# Patient Record
Sex: Female | Born: 1985 | Race: White | Hispanic: No | Marital: Single | Smoking: Current every day smoker
Health system: Southern US, Community
[De-identification: ages and names within clinical notes are randomized; demographics above are authoritative.]

## PROBLEM LIST (undated history)

## (undated) DIAGNOSIS — Z720 Tobacco use: Secondary | ICD-10-CM

## (undated) DIAGNOSIS — E161 Other hypoglycemia: Secondary | ICD-10-CM

## (undated) DIAGNOSIS — G43909 Migraine, unspecified, not intractable, without status migrainosus: Secondary | ICD-10-CM

## (undated) DIAGNOSIS — E039 Hypothyroidism, unspecified: Secondary | ICD-10-CM

## (undated) DIAGNOSIS — K219 Gastro-esophageal reflux disease without esophagitis: Secondary | ICD-10-CM

## (undated) DIAGNOSIS — L940 Localized scleroderma [morphea]: Secondary | ICD-10-CM

## (undated) DIAGNOSIS — I10 Essential (primary) hypertension: Secondary | ICD-10-CM

## (undated) DIAGNOSIS — R87629 Unspecified abnormal cytological findings in specimens from vagina: Secondary | ICD-10-CM

## (undated) HISTORY — DX: Unspecified abnormal cytological findings in specimens from vagina: R87.629

## (undated) HISTORY — PX: BELPHAROPTOSIS REPAIR: SHX369

## (undated) HISTORY — DX: Gastro-esophageal reflux disease without esophagitis: K21.9

## (undated) HISTORY — DX: Essential (primary) hypertension: I10

## (undated) HISTORY — PX: DILATION AND CURETTAGE OF UTERUS: SHX78

## (undated) HISTORY — DX: Tobacco use: Z72.0

## (undated) HISTORY — DX: Hypothyroidism, unspecified: E03.9

## (undated) HISTORY — DX: Localized scleroderma (morphea): L94.0

## (undated) HISTORY — PX: LEEP: SHX91

## (undated) HISTORY — DX: Migraine, unspecified, not intractable, without status migrainosus: G43.909

---

## 2005-01-22 ENCOUNTER — Observation Stay: Payer: Self-pay | Admitting: Obstetrics & Gynecology

## 2005-02-16 ENCOUNTER — Observation Stay: Payer: Self-pay

## 2005-03-18 ENCOUNTER — Observation Stay: Payer: Self-pay | Admitting: Obstetrics & Gynecology

## 2005-03-23 ENCOUNTER — Inpatient Hospital Stay: Payer: Self-pay | Admitting: Obstetrics & Gynecology

## 2006-03-13 ENCOUNTER — Observation Stay: Payer: Self-pay

## 2006-05-08 ENCOUNTER — Observation Stay: Payer: Self-pay | Admitting: Obstetrics and Gynecology

## 2006-06-10 ENCOUNTER — Inpatient Hospital Stay: Payer: Self-pay | Admitting: Obstetrics & Gynecology

## 2006-09-06 ENCOUNTER — Emergency Department: Payer: Self-pay | Admitting: Unknown Physician Specialty

## 2006-09-23 ENCOUNTER — Ambulatory Visit: Payer: Self-pay

## 2007-09-13 ENCOUNTER — Emergency Department: Payer: Self-pay | Admitting: Emergency Medicine

## 2008-09-18 ENCOUNTER — Emergency Department: Payer: Self-pay | Admitting: Emergency Medicine

## 2009-02-25 ENCOUNTER — Emergency Department: Payer: Self-pay | Admitting: Emergency Medicine

## 2010-06-18 ENCOUNTER — Emergency Department: Payer: Self-pay | Admitting: Emergency Medicine

## 2011-04-10 ENCOUNTER — Ambulatory Visit: Payer: Self-pay | Admitting: Nephrology

## 2012-04-01 ENCOUNTER — Emergency Department: Payer: Self-pay | Admitting: Emergency Medicine

## 2014-09-26 ENCOUNTER — Emergency Department: Payer: Self-pay | Admitting: Emergency Medicine

## 2015-02-07 DIAGNOSIS — G43909 Migraine, unspecified, not intractable, without status migrainosus: Secondary | ICD-10-CM | POA: Insufficient documentation

## 2015-02-07 DIAGNOSIS — I10 Essential (primary) hypertension: Secondary | ICD-10-CM | POA: Insufficient documentation

## 2015-02-07 DIAGNOSIS — Z720 Tobacco use: Secondary | ICD-10-CM | POA: Insufficient documentation

## 2015-02-07 DIAGNOSIS — K219 Gastro-esophageal reflux disease without esophagitis: Secondary | ICD-10-CM | POA: Insufficient documentation

## 2015-02-07 DIAGNOSIS — E039 Hypothyroidism, unspecified: Secondary | ICD-10-CM | POA: Insufficient documentation

## 2015-02-08 ENCOUNTER — Ambulatory Visit (INDEPENDENT_AMBULATORY_CARE_PROVIDER_SITE_OTHER): Payer: No Typology Code available for payment source | Admitting: Family Medicine

## 2015-02-08 ENCOUNTER — Encounter: Payer: Self-pay | Admitting: Family Medicine

## 2015-02-08 VITALS — BP 135/89 | HR 75 | Temp 98.3°F | Ht 65.5 in | Wt 175.0 lb

## 2015-02-08 DIAGNOSIS — E162 Hypoglycemia, unspecified: Secondary | ICD-10-CM

## 2015-02-08 DIAGNOSIS — Z720 Tobacco use: Secondary | ICD-10-CM

## 2015-02-08 DIAGNOSIS — R635 Abnormal weight gain: Secondary | ICD-10-CM | POA: Insufficient documentation

## 2015-02-08 DIAGNOSIS — E039 Hypothyroidism, unspecified: Secondary | ICD-10-CM

## 2015-02-08 DIAGNOSIS — Z0000 Encounter for general adult medical examination without abnormal findings: Secondary | ICD-10-CM

## 2015-02-08 DIAGNOSIS — E538 Deficiency of other specified B group vitamins: Secondary | ICD-10-CM

## 2015-02-08 NOTE — Patient Instructions (Addendum)
Do spread out your calories through the day Avoid really sweet foods and drinks, and try to have complex carbohydrates, nuts, whole grains instead of just sugary things Return for physical in the next few weeks and with provider of choice Avoid / limit caffeine and avoid diet drinks I still encourage smoking cessation  Smoking Cessation, Tips for Success If you are ready to quit smoking, congratulations! You have chosen to help yourself be healthier. Cigarettes bring nicotine, tar, carbon monoxide, and other irritants into your body. Your lungs, heart, and blood vessels will be able to work better without these poisons. There are many different ways to quit smoking. Nicotine gum, nicotine patches, a nicotine inhaler, or nicotine nasal spray can help with physical craving. Hypnosis, support groups, and medicines help break the habit of smoking. WHAT THINGS CAN I DO TO MAKE QUITTING EASIER?  Here are some tips to help you quit for good:  Pick a date when you will quit smoking completely. Tell all of your friends and family about your plan to quit on that date.  Do not try to slowly cut down on the number of cigarettes you are smoking. Pick a quit date and quit smoking completely starting on that day.  Throw away all cigarettes.   Clean and remove all ashtrays from your home, work, and car.  On a card, write down your reasons for quitting. Carry the card with you and read it when you get the urge to smoke.  Cleanse your body of nicotine. Drink enough water and fluids to keep your urine clear or pale yellow. Do this after quitting to flush the nicotine from your body.  Learn to predict your moods. Do not let a bad situation be your excuse to have a cigarette. Some situations in your life might tempt you into wanting a cigarette.  Never have "just one" cigarette. It leads to wanting another and another. Remind yourself of your decision to quit.  Change habits associated with smoking. If you  smoked while driving or when feeling stressed, try other activities to replace smoking. Stand up when drinking your coffee. Brush your teeth after eating. Sit in a different chair when you read the paper. Avoid alcohol while trying to quit, and try to drink fewer caffeinated beverages. Alcohol and caffeine may urge you to smoke.  Avoid foods and drinks that can trigger a desire to smoke, such as sugary or spicy foods and alcohol.  Ask people who smoke not to smoke around you.  Have something planned to do right after eating or having a cup of coffee. For example, plan to take a walk or exercise.  Try a relaxation exercise to calm you down and decrease your stress. Remember, you may be tense and nervous for the first 2 weeks after you quit, but this will pass.  Find new activities to keep your hands busy. Play with a pen, coin, or rubber band. Doodle or draw things on paper.  Brush your teeth right after eating. This will help cut down on the craving for the taste of tobacco after meals. You can also try mouthwash.   Use oral substitutes in place of cigarettes. Try using lemon drops, carrots, cinnamon sticks, or chewing gum. Keep them handy so they are available when you have the urge to smoke.  When you have the urge to smoke, try deep breathing.  Designate your home as a nonsmoking area.  If you are a heavy smoker, ask your health care provider about  a prescription for nicotine chewing gum. It can ease your withdrawal from nicotine.  Reward yourself. Set aside the cigarette money you save and buy yourself something nice.  Look for support from others. Join a support group or smoking cessation program. Ask someone at home or at work to help you with your plan to quit smoking.  Always ask yourself, "Do I need this cigarette or is this just a reflex?" Tell yourself, "Today, I choose not to smoke," or "I do not want to smoke." You are reminding yourself of your decision to quit.  Do not  replace cigarette smoking with electronic cigarettes (commonly called e-cigarettes). The safety of e-cigarettes is unknown, and some may contain harmful chemicals.  If you relapse, do not give up! Plan ahead and think about what you will do the next time you get the urge to smoke. HOW WILL I FEEL WHEN I QUIT SMOKING? You may have symptoms of withdrawal because your body is used to nicotine (the addictive substance in cigarettes). You may crave cigarettes, be irritable, feel very hungry, cough often, get headaches, or have difficulty concentrating. The withdrawal symptoms are only temporary. They are strongest when you first quit but will go away within 10-14 days. When withdrawal symptoms occur, stay in control. Think about your reasons for quitting. Remind yourself that these are signs that your body is healing and getting used to being without cigarettes. Remember that withdrawal symptoms are easier to treat than the major diseases that smoking can cause.  Even after the withdrawal is over, expect periodic urges to smoke. However, these cravings are generally short lived and will go away whether you smoke or not. Do not smoke! WHAT RESOURCES ARE AVAILABLE TO HELP ME QUIT SMOKING? Your health care provider can direct you to community resources or hospitals for support, which may include:  Group support.  Education.  Hypnosis.  Therapy. Document Released: 04/10/2004 Document Revised: 11/27/2013 Document Reviewed: 12/29/2012 [facility] Patient Information 2015 [place], [place]. This information is not intended to replace advice given to you by your health care provider. Make sure you discuss any questions you have with your health care provider.

## 2015-02-08 NOTE — Assessment & Plan Note (Signed)
Check thyroid function 

## 2015-02-08 NOTE — Progress Notes (Signed)
BP 135/89 mmHg  Pulse 75  Temp(Src) 98.3 F (36.8 C)  Ht 5' 5.5" (1.664 m)  Wt 175 lb (79.379 kg)  BMI 28.67 kg/m2  SpO2 95%  LMP 01/16/2015 (Approximate)   Subjective:    Patient ID: Janet Coleman, female    DOB: 1985-10-31, 29 y.o.   MRN: [id number]  HPI: Janet Coleman is a 29 y.o. female  Chief Complaint  Patient presents with  . Hypoglycemia    having episodes of jittery, weakness, fatigue. Has checked her sugar and it's been low.   She feels like she is falling to pieces It's all she can do to get through the day at work Going on for about a month, but worse the last two weeks Feels down, concentration and fatigue; hands and legs are shaky and feel like Jell-O A few years ago, had just a few episodes; it was hot outside Sugars are dropping to 60 range Spacing her foods out, grazing spreading the food out Checking sugars In the last year, gained 27 pounds Thyroid had been messed up and was on medicine and then they took her off medicine  Relevant past medical, surgical, family and social history reviewed and updated as indicated. Interim medical history since our last visit reviewed. Allergies and medications reviewed and updated. Grandmother never drank but was told she had cirrhosis; not sure about any lung disease  Review of Systems  Per HPI unless specifically indicated above     Objective:    BP 135/89 mmHg  Pulse 75  Temp(Src) 98.3 F (36.8 C)  Ht 5' 5.5" (1.664 m)  Wt 175 lb (79.379 kg)  BMI 28.67 kg/m2  SpO2 95%  LMP 01/16/2015 (Approximate)  Wt Readings from Last 3 Encounters:  02/08/15 175 lb (79.379 kg)  10/10/14 177 lb (80.287 kg)    Physical Exam  Constitutional: She appears well-developed and well-nourished. No distress.  HENT:  Head: Normocephalic and atraumatic.  Eyes: EOM are normal. No scleral icterus.  Neck: No thyromegaly present.  Cardiovascular: Normal rate, regular rhythm and normal heart sounds.   No murmur  heard. Pulmonary/Chest: Effort normal and breath sounds normal. No respiratory distress. She has no wheezes.  Abdominal: Soft. Bowel sounds are normal. She exhibits no distension.  Musculoskeletal: Normal range of motion. She exhibits no edema.  Neurological: She is alert. She displays no tremor. She exhibits normal muscle tone.  Reflex Scores:      Patellar reflexes are 2+ on the right side and 2+ on the left side. Skin: Skin is warm and dry. No rash noted. She is not diaphoretic. No pallor.  No palmar erythema  Psychiatric: She has a normal mood and affect. Her behavior is normal. Judgment and thought content normal.    No results found for this or any previous visit.    Assessment & Plan:   Problem List Items Addressed This Visit      Endocrine   Hypothyroidism    Check thyroid tests today; especially with recent weight gain      Relevant Orders   TSH   Hypoglycemia - Primary    Check GTT, insulin levels, A1C, glucose; do spread out calories, graze through the day, complex carbohydrates      Relevant Orders   Insulin, Free and Total   Hgb A1c w/o eAG   Glucose tolerance, 3 hours     Other   Tobacco use    Encouraged cessation; tips for success given in the after visit summary  Weight gain, abnormal    Check thyroid function      Relevant Orders   Hgb A1c w/o eAG    Other Visit Diagnoses    Preventative health care        Relevant Orders    Lipid Panel w/o Chol/HDL Ratio    Comprehensive metabolic panel    Vitamin B12 deficiency        check level; last lab was 305 (ideal is over 400) and that was on supplementation    Relevant Orders    Vitamin B12       An After Visit Summary was printed and given to the patient. Orders Placed This Encounter  Procedures  . Insulin, Free and Total  . TSH  . Hgb A1c w/o eAG  . Lipid Panel w/o Chol/HDL Ratio  . Vitamin B12  . Comprehensive metabolic panel  . Glucose tolerance, 3 hours   Follow up plan: Return  in about 2 weeks (around 02/22/2015) for (2-4 weeks) for complete physical with provider of choice.

## 2015-02-08 NOTE — Assessment & Plan Note (Signed)
Check GTT, insulin levels, A1C, glucose; do spread out calories, graze through the day, complex carbohydrates

## 2015-02-08 NOTE — Assessment & Plan Note (Signed)
Check thyroid tests today; especially with recent weight gain

## 2015-02-08 NOTE — Assessment & Plan Note (Signed)
Encouraged cessation; tips for success given in the after visit summary

## 2015-02-09 LAB — COMPREHENSIVE METABOLIC PANEL
ALK PHOS: 83 IU/L (ref 39–117)
ALT: 6 IU/L (ref 0–32)
AST: 11 IU/L (ref 0–40)
Albumin/Globulin Ratio: 1.9 (ref 1.1–2.5)
Albumin: 4.2 g/dL (ref 3.5–5.5)
BILIRUBIN TOTAL: 0.2 mg/dL (ref 0.0–1.2)
BUN / CREAT RATIO: 12 (ref 8–20)
BUN: 9 mg/dL (ref 6–20)
CHLORIDE: 100 mmol/L (ref 97–108)
CO2: 22 mmol/L (ref 18–29)
Calcium: 8.7 mg/dL (ref 8.7–10.2)
Creatinine, Ser: 0.73 mg/dL (ref 0.57–1.00)
GFR calc Af Amer: 129 mL/min/{1.73_m2} (ref >59)
GFR calc non Af Amer: 112 mL/min/{1.73_m2} (ref >59)
Globulin, Total: 2.2 g/dL (ref 1.5–4.5)
Glucose: 75 mg/dL (ref 65–99)
Potassium: 3.8 mmol/L (ref 3.5–5.2)
SODIUM: 138 mmol/L (ref 134–144)
TOTAL PROTEIN: 6.4 g/dL (ref 6.0–8.5)

## 2015-02-09 LAB — VITAMIN B12: Vitamin B-12: 266 pg/mL (ref 211–946)

## 2015-02-09 LAB — LIPID PANEL W/O CHOL/HDL RATIO
CHOLESTEROL TOTAL: 139 mg/dL (ref 100–199)
HDL: 48 mg/dL (ref >39)
LDL Calculated: 77 mg/dL (ref 0–99)
TRIGLYCERIDES: 69 mg/dL (ref 0–149)
VLDL Cholesterol Cal: 14 mg/dL (ref 5–40)

## 2015-02-09 LAB — HGB A1C W/O EAG: Hgb A1c MFr Bld: 5.2 % (ref 4.8–5.6)

## 2015-02-09 LAB — TSH: TSH: 3.47 u[IU]/mL (ref 0.450–4.500)

## 2015-02-14 LAB — INSULIN, FREE AND TOTAL
FREE INSULIN: 3.8 uU/mL
Total Insulin: 3.8 uU/mL

## 2015-02-15 ENCOUNTER — Encounter: Payer: Self-pay | Admitting: Family Medicine

## 2015-02-15 ENCOUNTER — Telehealth: Payer: Self-pay

## 2015-02-15 DIAGNOSIS — R531 Weakness: Secondary | ICD-10-CM

## 2015-02-15 NOTE — Telephone Encounter (Signed)
Received a voicemail from the patient stating that she still feels very bad, I believe that she said that she still had not received a few of her lab results (glucose tolerance?insulin free and total?) Please call and discuss this with the patient.

## 2015-02-15 NOTE — Telephone Encounter (Signed)
Legs are really weak, very fatigued, hands get shaky, extremely wore out STAFF -- patient coming in first thing Monday morning; let's get GTT scheduled and draw cortisol and CBC and magnesium Appt after that with me or Elnita Maxwell, whichever To Urgent Care over weekend over weekend

## 2015-02-18 ENCOUNTER — Other Ambulatory Visit: Payer: No Typology Code available for payment source

## 2015-02-18 NOTE — Telephone Encounter (Signed)
Dr. Sherie Don I received a message regarding this patient and I am not sure what am I supposed to do with it? Message was sent Friday after 5 pm and I just got here after lunch?

## 2015-02-18 NOTE — Telephone Encounter (Signed)
Patient should have been seen in the morning; she did not come in, I suppose Orders were entered into the chart last week She still needs the GTT scheduled I will close the chart since she did not come in today She most likely simply has reactive hypoglycemia but I have entered other labs for completeness

## 2015-02-19 ENCOUNTER — Other Ambulatory Visit: Payer: No Typology Code available for payment source

## 2015-02-19 DIAGNOSIS — E162 Hypoglycemia, unspecified: Secondary | ICD-10-CM

## 2015-02-19 DIAGNOSIS — R531 Weakness: Secondary | ICD-10-CM

## 2015-02-19 LAB — GLUCOSE HEMOCUE WAIVED: GLU HEMOCUE WAIVED: 83 mg/dL (ref 65–99)

## 2015-02-20 ENCOUNTER — Telehealth: Payer: Self-pay | Admitting: Family Medicine

## 2015-02-20 ENCOUNTER — Other Ambulatory Visit
Admission: RE | Admit: 2015-02-20 | Discharge: 2015-02-20 | Disposition: A | Discharge status: Home / Self Care | Payer: No Typology Code available for payment source | Source: Ambulatory Visit | Attending: Unknown Physician Specialty | Admitting: Unknown Physician Specialty

## 2015-02-20 DIAGNOSIS — E161 Other hypoglycemia: Secondary | ICD-10-CM

## 2015-02-20 DIAGNOSIS — E162 Hypoglycemia, unspecified: Secondary | ICD-10-CM | POA: Insufficient documentation

## 2015-02-20 DIAGNOSIS — E270 Other adrenocortical overactivity: Secondary | ICD-10-CM

## 2015-02-20 DIAGNOSIS — R7989 Other specified abnormal findings of blood chemistry: Secondary | ICD-10-CM

## 2015-02-20 LAB — CORTISOL: CORTISOL: 19.9 ug/dL

## 2015-02-20 LAB — CBC WITH DIFFERENTIAL/PLATELET
Basophils Absolute: 0 10*3/uL (ref 0.0–0.2)
Basos: 0 %
EOS (ABSOLUTE): 0.2 10*3/uL (ref 0.0–0.4)
Eos: 3 %
Hematocrit: 41.3 % (ref 34.0–46.6)
Hemoglobin: 14 g/dL (ref 11.1–15.9)
IMMATURE GRANS (ABS): 0 10*3/uL (ref 0.0–0.1)
IMMATURE GRANULOCYTES: 0 %
Lymphocytes Absolute: 2.1 10*3/uL (ref 0.7–3.1)
Lymphs: 33 %
MCH: 31.3 pg (ref 26.6–33.0)
MCHC: 33.9 g/dL (ref 31.5–35.7)
MCV: 92 fL (ref 79–97)
MONOCYTES: 6 %
Monocytes Absolute: 0.4 10*3/uL (ref 0.1–0.9)
Neutrophils Absolute: 3.6 10*3/uL (ref 1.4–7.0)
Neutrophils: 58 %
Platelets: 205 10*3/uL (ref 150–379)
RBC: 4.48 x10E6/uL (ref 3.77–5.28)
RDW: 13.1 % (ref 12.3–15.4)
WBC: 6.3 10*3/uL (ref 3.4–10.8)

## 2015-02-20 LAB — GLUCOSE, 1 HOUR GESTATIONAL: GLUCOSE, 1 HOUR-GESTATIONAL: 94 mg/dL (ref 70–189)

## 2015-02-20 LAB — GLUCOSE, FASTING GESTATIONAL: Glucose Tolerance, Fasting: 96 mg/dL

## 2015-02-20 LAB — GLUCOSE, 2 HOUR GESTATIONAL: GLUCOSE, 2 HOUR-GESTATIONAL: 57 mg/dL — AB (ref 70–164)

## 2015-02-20 LAB — MAGNESIUM: MAGNESIUM: 2.1 mg/dL (ref 1.6–2.3)

## 2015-02-20 LAB — GLUCOSE, 3 HOUR GESTATIONAL: Glucose, GTT - 3 Hour: 78 mg/dL (ref 70–144)

## 2015-02-20 LAB — SPECIMEN STATUS REPORT

## 2015-02-20 NOTE — Telephone Encounter (Signed)
I called, but voicemail has not been set up yet

## 2015-02-21 ENCOUNTER — Telehealth: Payer: Self-pay | Admitting: Family Medicine

## 2015-02-21 DIAGNOSIS — E161 Other hypoglycemia: Secondary | ICD-10-CM | POA: Insufficient documentation

## 2015-02-21 DIAGNOSIS — R7989 Other specified abnormal findings of blood chemistry: Secondary | ICD-10-CM | POA: Insufficient documentation

## 2015-02-21 DIAGNOSIS — E270 Other adrenocortical overactivity: Secondary | ICD-10-CM

## 2015-02-21 NOTE — Telephone Encounter (Signed)
I talked with patient Reactive hypoglycemia Avoid sugars, white bread, sweetened drinks, etc. Cortisol is mildly elevated Refer to endo (not sure if medicine indicated like starlix, since her symptoms are severe and came on so suddenly and the abnormal cortisol to boot)

## 2015-02-21 NOTE — Telephone Encounter (Signed)
There is another phone already open; I tried to reach her yesterday; see the other phone note

## 2015-02-21 NOTE — Telephone Encounter (Signed)
Routing to provider  

## 2015-02-21 NOTE — Telephone Encounter (Signed)
Pt called inquiring about her labs from past couple of days, one done here and yesterday done at [facility].

## 2015-03-01 ENCOUNTER — Encounter: Payer: Self-pay | Admitting: Unknown Physician Specialty

## 2015-03-01 ENCOUNTER — Ambulatory Visit (INDEPENDENT_AMBULATORY_CARE_PROVIDER_SITE_OTHER): Payer: No Typology Code available for payment source | Admitting: Unknown Physician Specialty

## 2015-03-01 VITALS — BP 127/84 | HR 70 | Temp 98.5°F | Ht 65.75 in | Wt 172.0 lb

## 2015-03-01 DIAGNOSIS — E162 Hypoglycemia, unspecified: Secondary | ICD-10-CM | POA: Diagnosis not present

## 2015-03-01 DIAGNOSIS — Z0000 Encounter for general adult medical examination without abnormal findings: Secondary | ICD-10-CM | POA: Diagnosis not present

## 2015-03-01 DIAGNOSIS — R87619 Unspecified abnormal cytological findings in specimens from cervix uteri: Secondary | ICD-10-CM

## 2015-03-01 NOTE — Assessment & Plan Note (Signed)
Discussed low GI diet.  Recommended resources.  Consider nutritionist.

## 2015-03-01 NOTE — Progress Notes (Signed)
   BP 127/84 mmHg  Pulse 70  Temp(Src) 98.5 F (36.9 C)  Ht 5' 5.75" (1.67 m)  Wt 172 lb (78.019 kg)  BMI 27.97 kg/m2  SpO2 99%  LMP 02/14/2015 (Exact Date)   Subjective:    Patient ID: Janet Coleman, female    DOB: 05/04/1986, 29 y.o.   MRN: [id number]  HPI: Janet Coleman is a 28 y.o. female  Chief Complaint  Patient presents with  . Annual Exam     Relevant past medical, surgical, family and social history reviewed and updated as indicated. Interim medical history since our last visit reviewed. Allergies and medications reviewed and updated.  Discussed Hypoglycemia.    Review of Systems  Constitutional: Negative.   HENT: Negative.   Eyes: Negative.   Respiratory: Negative.   Cardiovascular: Negative.   Gastrointestinal: Negative.   Endocrine: Negative.   Genitourinary: Negative.   Musculoskeletal: Negative.   Skin: Negative.   Allergic/Immunologic: Negative.   Neurological: Negative.   Hematological: Negative.   Psychiatric/Behavioral: Negative.     Per HPI unless specifically indicated above     Objective:    BP 127/84 mmHg  Pulse 70  Temp(Src) 98.5 F (36.9 C)  Ht 5' 5.75" (1.67 m)  Wt 172 lb (78.019 kg)  BMI 27.97 kg/m2  SpO2 99%  LMP 02/14/2015 (Exact Date)  Wt Readings from Last 3 Encounters:  03/01/15 172 lb (78.019 kg)  02/08/15 175 lb (79.379 kg)  10/10/14 177 lb (80.287 kg)    Physical Exam  Constitutional: She is oriented to person, place, and time. She appears well-developed and well-nourished.  HENT:  Head: Normocephalic and atraumatic.  Eyes: Pupils are equal, round, and reactive to light. Right eye exhibits no discharge. Left eye exhibits no discharge. No scleral icterus.  Neck: Normal range of motion. Neck supple. Carotid bruit is not present. No thyromegaly present.  Cardiovascular: Normal rate, regular rhythm and normal heart sounds.  Exam reveals no gallop and no friction rub.   No murmur heard. Pulmonary/Chest: Effort  normal and breath sounds normal. No respiratory distress. She has no wheezes. She has no rales.  Abdominal: Soft. Bowel sounds are normal. There is no tenderness. There is no rebound.  Genitourinary: Vagina normal and uterus normal. No breast swelling, tenderness or discharge. Cervix exhibits no motion tenderness, no discharge and no friability. Right adnexum displays no mass, no tenderness and no fullness. Left adnexum displays no mass, no tenderness and no fullness.  Musculoskeletal: Normal range of motion.  Lymphadenopathy:    She has no cervical adenopathy.  Neurological: She is alert and oriented to person, place, and time.  Skin: Skin is warm, dry and intact. No rash noted.  Psychiatric: She has a normal mood and affect. Her speech is normal and behavior is normal. Judgment and thought content normal. Cognition and memory are normal.  Vitals reviewed.    Assessment & Plan:   Problem List Items Addressed This Visit      Unprioritized   Hypoglycemia    Discussed low GI diet.  Recommended resources.  Consider nutritionist.         Other Visit Diagnoses    Annual physical exam    -  Primary        Follow up plan: Return if symptoms worsen or fail to improve.

## 2015-03-01 NOTE — Addendum Note (Signed)
Addended by: Gabriel Cirri on: 03/01/2015 03:59 PM   Modules accepted: Kipp Brood

## 2015-03-01 NOTE — Patient Instructions (Addendum)
Glycemic Index.   "Eat Fat Get Thin"   "Sugar Busters" Consider Gluten Free diet.

## 2015-03-01 NOTE — Addendum Note (Signed)
Addended by: Gabriel Cirri on: 03/01/2015 03:58 PM   Modules accepted: Orders, SmartSet

## 2015-03-06 ENCOUNTER — Encounter: Payer: Self-pay | Admitting: Unknown Physician Specialty

## 2015-03-06 LAB — PAP LB, RFX HPV ASCU: PAP Smear Comment: 0

## 2015-03-07 ENCOUNTER — Telehealth: Payer: Self-pay

## 2015-03-07 DIAGNOSIS — IMO0002 Reserved for concepts with insufficient information to code with codable children: Secondary | ICD-10-CM

## 2015-03-07 NOTE — Telephone Encounter (Signed)
I called her Tuesday and sent a letter because I couldn't get hold of her.  Please let her know she had an abnormal pap but it was not cancer.  But, to keep it from becoming cancer, it needs to be treated.  This can be done by an office procedure.  I would like to refer her to [facility] unless she would like to go to another gynecologist.  But, i will put the order in for [facility].

## 2015-03-07 NOTE — Telephone Encounter (Signed)
Patient called stating that someone called her on Friday 03/01/15 but I do not see any documentation of a call. Patient states voicemail is not set up but she just knows she had a missed call. Patient thinks it may be about her PAP. What can I tell her regarding her PAP?

## 2015-03-08 NOTE — Telephone Encounter (Signed)
Called and let patient know

## 2015-04-03 DIAGNOSIS — Z720 Tobacco use: Secondary | ICD-10-CM | POA: Diagnosis not present

## 2015-04-03 DIAGNOSIS — N8320 Unspecified ovarian cysts: Secondary | ICD-10-CM | POA: Diagnosis not present

## 2015-04-03 DIAGNOSIS — N200 Calculus of kidney: Secondary | ICD-10-CM | POA: Insufficient documentation

## 2015-04-03 DIAGNOSIS — I10 Essential (primary) hypertension: Secondary | ICD-10-CM | POA: Insufficient documentation

## 2015-04-03 DIAGNOSIS — R1031 Right lower quadrant pain: Secondary | ICD-10-CM | POA: Diagnosis present

## 2015-04-04 ENCOUNTER — Emergency Department
Admission: EM | Admit: 2015-04-04 | Discharge: 2015-04-04 | Disposition: A | Discharge status: Home / Self Care | Payer: No Typology Code available for payment source | Attending: Emergency Medicine | Admitting: Emergency Medicine

## 2015-04-04 ENCOUNTER — Encounter: Payer: Self-pay | Admitting: Emergency Medicine

## 2015-04-04 ENCOUNTER — Emergency Department: Payer: No Typology Code available for payment source

## 2015-04-04 DIAGNOSIS — N83201 Unspecified ovarian cyst, right side: Secondary | ICD-10-CM

## 2015-04-04 DIAGNOSIS — N200 Calculus of kidney: Secondary | ICD-10-CM

## 2015-04-04 HISTORY — DX: Other hypoglycemia: E16.1

## 2015-04-04 LAB — CBC WITH DIFFERENTIAL/PLATELET
BASOS ABS: 0.1 10*3/uL (ref 0–0.1)
Basophils Relative: 1 %
Eosinophils Absolute: 0.2 10*3/uL (ref 0–0.7)
Eosinophils Relative: 2 %
HEMATOCRIT: 42.2 % (ref 35.0–47.0)
Hemoglobin: 14.3 g/dL (ref 12.0–16.0)
LYMPHS PCT: 18 %
Lymphs Abs: 2.1 10*3/uL (ref 1.0–3.6)
MCH: 31.2 pg (ref 26.0–34.0)
MCHC: 34 g/dL (ref 32.0–36.0)
MCV: 91.9 fL (ref 80.0–100.0)
Monocytes Absolute: 0.4 10*3/uL (ref 0.2–0.9)
Monocytes Relative: 4 %
NEUTROS ABS: 8.7 10*3/uL — AB (ref 1.4–6.5)
Neutrophils Relative %: 75 %
PLATELETS: 180 10*3/uL (ref 150–440)
RBC: 4.59 MIL/uL (ref 3.80–5.20)
RDW: 12.8 % (ref 11.5–14.5)
WBC: 11.5 10*3/uL — AB (ref 3.6–11.0)

## 2015-04-04 LAB — COMPREHENSIVE METABOLIC PANEL
ALT: 13 U/L — ABNORMAL LOW (ref 14–54)
AST: 17 U/L (ref 15–41)
Albumin: 4.4 g/dL (ref 3.5–5.0)
Alkaline Phosphatase: 80 U/L (ref 38–126)
Anion gap: 10 (ref 5–15)
BILIRUBIN TOTAL: 0.5 mg/dL (ref 0.3–1.2)
BUN: 12 mg/dL (ref 6–20)
CHLORIDE: 104 mmol/L (ref 101–111)
CO2: 24 mmol/L (ref 22–32)
Calcium: 9.3 mg/dL (ref 8.9–10.3)
Creatinine, Ser: 0.8 mg/dL (ref 0.44–1.00)
GFR calc Af Amer: >60 mL/min (ref >60)
GFR calc non Af Amer: >60 mL/min (ref >60)
Glucose, Bld: 117 mg/dL — ABNORMAL HIGH (ref 65–99)
POTASSIUM: 4 mmol/L (ref 3.5–5.1)
Sodium: 138 mmol/L (ref 135–145)
Total Protein: 7.3 g/dL (ref 6.5–8.1)

## 2015-04-04 LAB — URINALYSIS COMPLETE WITH MICROSCOPIC (ARMC ONLY)
Bilirubin Urine: NEGATIVE
Glucose, UA: NEGATIVE mg/dL
Hgb urine dipstick: NEGATIVE
Leukocytes, UA: NEGATIVE
Nitrite: NEGATIVE
PH: 8 (ref 5.0–8.0)
PROTEIN: NEGATIVE mg/dL
Specific Gravity, Urine: 1.02 (ref 1.005–1.030)

## 2015-04-04 MED ORDER — MORPHINE SULFATE (PF) 2 MG/ML IV SOLN
2.0000 mg | Freq: Once | INTRAVENOUS | Status: AC
  Administered 2015-04-04: 2 mg via INTRAVENOUS

## 2015-04-04 MED ORDER — IOHEXOL 240 MG/ML SOLN
25.0000 mL | Freq: Once | INTRAMUSCULAR | Status: AC | PRN
  Administered 2015-04-04: 25 mL via ORAL

## 2015-04-04 MED ORDER — ONDANSETRON HCL 4 MG/2ML IJ SOLN
INTRAMUSCULAR | Status: AC
  Administered 2015-04-04: 4 mg via INTRAVENOUS
  Filled 2015-04-04: qty 2

## 2015-04-04 MED ORDER — IOHEXOL 300 MG/ML  SOLN
100.0000 mL | Freq: Once | INTRAMUSCULAR | Status: AC | PRN
  Administered 2015-04-04: 100 mL via INTRAVENOUS

## 2015-04-04 MED ORDER — ONDANSETRON HCL 4 MG/2ML IJ SOLN
4.0000 mg | Freq: Once | INTRAMUSCULAR | Status: AC
  Administered 2015-04-04: 4 mg via INTRAVENOUS

## 2015-04-04 MED ORDER — MORPHINE SULFATE (PF) 2 MG/ML IV SOLN
INTRAVENOUS | Status: AC
  Administered 2015-04-04: 2 mg via INTRAVENOUS
  Filled 2015-04-04: qty 1

## 2015-04-04 MED ORDER — OXYCODONE-ACETAMINOPHEN 5-325 MG PO TABS: 1.0000 | ORAL_TABLET | ORAL | Status: DC | PRN

## 2015-04-04 NOTE — ED Provider Notes (Signed)
[facility] Emergency Department Provider Note  ____________________________________________  Time seen: 2:00 AM  I have reviewed the triage vital signs and the nursing notes.   HISTORY  Chief Complaint Abdominal Pain and Nausea     HPI Janet Coleman is a 29 y.o. female presents with right lower quadrant abdominal pain with onset approximately 9:00 PM patient also admits to nausea but no vomiting. Patient denies any fever no dysuria no hematuria no constipation or diarrhea. Current pain score 8 out of 10     Past Medical History  Diagnosis Date  . GERD (gastroesophageal reflux disease)   . Hypertension   . 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  . Hypothyroidism   . Tobacco use   . Migraines   . Reactive hypoglycemia     Patient Active Problem List   Diagnosis Date Noted  . Reactive hypoglycemia 02/21/2015  . Elevated cortisol level 02/21/2015  . Weight gain, abnormal 02/08/2015  . Hypoglycemia 02/08/2015  . GERD (gastroesophageal reflux disease)   . Hypertension   . Hypothyroidism   . Tobacco use   . Migraines     Past Surgical History  Procedure Laterality Date  . Dilation and curettage of uterus      x 2  . Leep      x 2. last paps 2011, 2012, 2013 were normal    Current Outpatient Rx  Name  Route  Sig  Dispense  Refill  . SUMAtriptan (IMITREX) 50 MG tablet   Oral   Take 50 mg by mouth every 2 (two) hours as needed for migraine. May repeat in 2 hours if headache persists or recurs.           Allergies Toradol  Family History  Problem Relation Age of Onset  . Hypertension Mother   . Autoimmune disease Mother   . Stroke Father   . Hypertension Father   . Heart attack Father   . Aneurysm Father     brain  . Heart disease Father   . Cancer Maternal Aunt     breast  . Cancer Maternal Grandmother     breast  . Diabetes Maternal Grandmother   . Stroke Maternal  Grandfather   . Diabetes Paternal Grandmother   . Heart disease Paternal Grandmother     Social History Social History  Substance Use Topics  . Smoking status: Current Every Day Smoker -- 0.50 packs/day    Types: Cigarettes  . Smokeless tobacco: Never Used  . Alcohol Use: No    Review of Systems  Constitutional: Negative for fever. Eyes: Negative for visual changes. ENT: Negative for sore throat. Cardiovascular: Negative for chest pain. Respiratory: Negative for shortness of breath. Gastrointestinal: Positive for abdominal pain and nausea, negative for vomiting and diarrhea. Genitourinary: Negative for dysuria. Musculoskeletal: Negative for back pain. Skin: Negative for rash. Neurological: Negative for headaches, focal weakness or numbness.   10-point ROS otherwise negative.  ____________________________________________   PHYSICAL EXAM:  VITAL SIGNS: ED Triage Vitals  Enc Vitals Group     BP 04/04/15 0000 139/75 mmHg     Pulse Rate 04/04/15 0000 88     Resp 04/04/15 0000 18     Temp 04/04/15 0000 97.8 F (36.6 C)     Temp Source 04/04/15 0000 Oral     SpO2 04/04/15 0000 96 %     Weight 04/04/15 0000 172 lb (78.019 kg)     Height 04/04/15 0000  5\' 6"  (1.676 m)     Head Cir --      Peak Flow --      Pain Score 04/04/15 0001 9     Pain Loc --      Pain Edu? --      Excl. in GC? --      Constitutional: Alert and oriented. Well appearing and in no distress. Eyes: Conjunctivae are normal. PERRL. Normal extraocular movements. ENT   Head: Normocephalic and atraumatic.   Nose: No congestion/rhinnorhea.   Mouth/Throat: Mucous membranes are moist.   Neck: No stridor. Cardiovascular: Normal rate, regular rhythm. Normal and symmetric distal pulses are present in all extremities. No murmurs, rubs, or gallops. Respiratory: Normal respiratory effort without tachypnea nor retractions. Breath sounds are clear and equal bilaterally. No  wheezes/rales/rhonchi. Gastrointestinal: Positive right lower quadrant pain No distention. There is no CVA tenderness. Genitourinary: deferred Musculoskeletal: Nontender with normal range of motion in all extremities. No joint effusions.  No lower extremity tenderness nor edema. Neurologic:  Normal speech and language. No gross focal neurologic deficits are appreciated. Speech is normal.  Skin:  Skin is warm, dry and intact. No rash noted. Psychiatric: Mood and affect are normal. Speech and behavior are normal. Patient exhibits appropriate insight and judgment.  ____________________________________________    LABS (pertinent positives/negatives)  Labs Reviewed  CBC WITH DIFFERENTIAL/PLATELET - Abnormal; Notable for the following:    WBC 11.5 (*)    Neutro Abs 8.7 (*)    All other components within normal limits  COMPREHENSIVE METABOLIC PANEL - Abnormal; Notable for the following:    Glucose, Bld 117 (*)    ALT 13 (*)    All other components within normal limits  URINALYSIS COMPLETEWITH MICROSCOPIC ([facility] ONLY) - Abnormal; Notable for the following:    Color, Urine YELLOW (*)    APPearance CLEAR (*)    Ketones, ur 1+ (*)    Bacteria, UA RARE (*)    Squamous Epithelial / LPF 6-30 (*)    All other components within normal limits      RADIOLOGY        CT Abdomen Pelvis W Contrast (Final result) Result time: 04/04/15 02:52:15   Final result by Rad Results In Interface (04/04/15 02:52:15)   Narrative:   CLINICAL DATA: RIGHT lower quadrant pain beginning around 2100 hours. History of exploratory laparotomy.  EXAM: CT ABDOMEN AND PELVIS WITH CONTRAST  TECHNIQUE: Multidetector CT imaging of the abdomen and pelvis was performed using the standard protocol following bolus administration of intravenous contrast.  CONTRAST: OMNIPAQUE IOHEXOL 300 MG/ML SOLN  COMPARISON: CT abdomen and pelvis June 18, 2010  FINDINGS: LUNG BASES: Included view of the lung  bases demonstrate dependent atelectasis. Visualized heart and pericardium are unremarkable.  SOLID ORGANS: The liver, spleen, gallbladder, pancreas and adrenal glands are unremarkable.  GASTROINTESTINAL TRACT: The stomach, small and large bowel are normal in course and caliber without inflammatory changes. Normal appendix. Moderate amount of retained large bowel stool.  KIDNEYS/ URINARY TRACT: Kidneys are orthotopic, demonstrating symmetric enhancement. Mild bilateral hydroureteronephrosis ; 2 mm LEFT ureterovesicular junction calculus. No nephrolithiasis, or solid renal masses. The unopacified ureters are normal in course and caliber. Urinary bladder is well distended.  PERITONEUM/RETROPERITONEUM: Aortoiliac vessels are normal in course and caliber. No lymphadenopathy by CT size criteria. Internal reproductive organs are unremarkable. Small amount of free fluid in the pelvis and about the RIGHT adnexa with peripherally enhancing 2.1 cm dominant follicle/cyst.  SOFT TISSUE/OSSEOUS STRUCTURES: Non-suspicious. Small fat containing  umbilical hernia.  IMPRESSION: Mild bilateral hydroureteronephrosis. 2 mm LEFT ureterovesicular junction calculus.  2.1 cm dominant RIGHT ovarian follicle/cyst with small amount of free fluid in the pelvis, and about the RIGHT ovary which could represent ruptured cyst.  Normal appendix.   Electronically Signed By: Awilda Metro M.D. On: 04/04/2015 02:52          INITIAL IMPRESSION / ASSESSMENT AND PLAN / ED COURSE  Pertinent labs & imaging results that were available during my care of the patient were reviewed by me and considered in my medical decision making (see chart for details).  Patient received IV morphine for pain control with significant pain improvement. Patient CT scan revealed a left 2 mm ureterovesicular junction kidney stone as well as a right ovarian cyst without evidence of rupture. Patient was informed of all  clinical findings  ____________________________________________   FINAL CLINICAL IMPRESSION(S) / ED DIAGNOSES  Final diagnoses:  Kidney stone on left side  Cyst of right ovary      Darci Current, MD 04/04/15 [id number]

## 2015-04-04 NOTE — ED Notes (Signed)
Pt presents to ED with right lower abd pain. Onset around 2100. Pt reports nausea but denies vomiting. Denies similar symptoms previously. Pt states she went to sleep for about an hour but her pain increased and woke her up. Pt appears uncomfortable and slightly restless during triage and states she does not want to sit down while she answers questions. Pt alert with no increased work of breathing or acute distress noted at this time. States pain seems to improve slightly with light pressure.

## 2015-04-04 NOTE — Discharge Instructions (Signed)
Kidney Stones °Kidney stones (urolithiasis) are deposits that form inside your kidneys. The intense pain is caused by the stone moving through the urinary tract. When the stone moves, the ureter goes into spasm around the stone. The stone is usually passed in the urine.  °CAUSES  °· A disorder that makes certain neck glands produce too much parathyroid hormone (primary hyperparathyroidism). °· A buildup of uric acid crystals, similar to gout in your joints. °· Narrowing (stricture) of the ureter. °· A kidney obstruction present at birth (congenital obstruction). °· Previous surgery on the kidney or ureters. °· Numerous kidney infections. °SYMPTOMS  °· Feeling sick to your stomach (nauseous). °· Throwing up (vomiting). °· Blood in the urine (hematuria). °· Pain that usually spreads (radiates) to the groin. °· Frequency or urgency of urination. °DIAGNOSIS  °· Taking a history and physical exam. °· Blood or urine tests. °· CT scan. °· Occasionally, an examination of the inside of the urinary bladder (cystoscopy) is performed. °TREATMENT  °· Observation. °· Increasing your fluid intake. °· Extracorporeal shock wave lithotripsy--This is a noninvasive procedure that uses shock waves to break up kidney stones. °· Surgery may be needed if you have severe pain or persistent obstruction. There are various surgical procedures. Most of the procedures are performed with the use of small instruments. Only small incisions are needed to accommodate these instruments, so recovery time is minimized. °The size, location, and chemical composition are all important variables that will determine the proper choice of action for you. Talk to your health care provider to better understand your situation so that you will minimize the risk of injury to yourself and your kidney.  °HOME CARE INSTRUCTIONS  °· Drink enough water and fluids to keep your urine clear or pale yellow. This will help you to pass the stone or stone fragments. °· Strain  all urine through the provided strainer. Keep all particulate matter and stones for your health care provider to see. The stone causing the pain may be as small as a grain of salt. It is very important to use the strainer each and every time you pass your urine. The collection of your stone will allow your health care provider to analyze it and verify that a stone has actually passed. The stone analysis will often identify what you can do to reduce the incidence of recurrences. °· Only take over-the-counter or prescription medicines for pain, discomfort, or fever as directed by your health care provider. °· Make a follow-up appointment with your health care provider as directed. °· Get follow-up X-rays if required. The absence of pain does not always mean that the stone has passed. It may have only stopped moving. If the urine remains completely obstructed, it can cause loss of kidney function or even complete destruction of the kidney. It is your responsibility to make sure X-rays and follow-ups are completed. Ultrasounds of the kidney can show blockages and the status of the kidney. Ultrasounds are not associated with any radiation and can be performed easily in a matter of minutes. °SEEK MEDICAL CARE IF: °· You experience pain that is progressive and unresponsive to any pain medicine you have been prescribed. °SEEK IMMEDIATE MEDICAL CARE IF:  °· Pain cannot be controlled with the prescribed medicine. °· You have a fever or shaking chills. °· The severity or intensity of pain increases over 18 hours and is not relieved by pain medicine. °· You develop a new onset of abdominal pain. °· You feel faint or pass out. °·   You are unable to urinate. MAKE SURE YOU:   Understand these instructions.  Will watch your condition.  Will get help right away if you are not doing well or get worse. Document Released: 07/13/2005 Document Revised: 03/15/2013 Document Reviewed: 12/14/2012 [facility] Patient Information 2015  [place], [place]. This information is not intended to replace advice given to you by your health care provider. Make sure you discuss any questions you have with your health care provider.  Ovarian Cyst An ovarian cyst is a fluid-filled sac that forms on an ovary. The ovaries are small organs that produce eggs in women. Various types of cysts can form on the ovaries. Most are not cancerous. Many do not cause problems, and they often go away on their own. Some may cause symptoms and require treatment. Common types of ovarian cysts include:  Functional cysts--These cysts may occur every month during the menstrual cycle. This is normal. The cysts usually go away with the next menstrual cycle if the woman does not get pregnant. Usually, there are no symptoms with a functional cyst.  Endometrioma cysts--These cysts form from the tissue that lines the uterus. They are also called "chocolate cysts" because they become filled with blood that turns Janet Coleman. This type of cyst can cause pain in the lower abdomen during intercourse and with your menstrual period.  Cystadenoma cysts--This type develops from the cells on the outside of the ovary. These cysts can get very big and cause lower abdomen pain and pain with intercourse. This type of cyst can twist on itself, cut off its blood supply, and cause severe pain. It can also easily rupture and cause a lot of pain.  Dermoid cysts--This type of cyst is sometimes found in both ovaries. These cysts may contain different kinds of body tissue, such as skin, teeth, hair, or cartilage. They usually do not cause symptoms unless they get very big.  Theca lutein cysts--These cysts occur when too much of a certain hormone (human chorionic gonadotropin) is produced and overstimulates the ovaries to produce an egg. This is most common after procedures used to assist with the conception of a baby (in vitro fertilization). CAUSES   Fertility drugs can cause a condition in which  multiple large cysts are formed on the ovaries. This is called ovarian hyperstimulation syndrome.  A condition called polycystic ovary syndrome can cause hormonal imbalances that can lead to nonfunctional ovarian cysts. SIGNS AND SYMPTOMS  Many ovarian cysts do not cause symptoms. If symptoms are present, they may include:  Pelvic pain or pressure.  Pain in the lower abdomen.  Pain during sexual intercourse.  Increasing girth (swelling) of the abdomen.  Abnormal menstrual periods.  Increasing pain with menstrual periods.  Stopping having menstrual periods without being pregnant. DIAGNOSIS  These cysts are commonly found during a routine or annual pelvic exam. Tests may be ordered to find out more about the cyst. These tests may include:  Ultrasound.  X-ray of the pelvis.  CT scan.  MRI.  Blood tests. TREATMENT  Many ovarian cysts go away on their own without treatment. Your health care provider may want to check your cyst regularly for 2-3 months to see if it changes. For women in menopause, it is particularly important to monitor a cyst closely because of the higher rate of ovarian cancer in menopausal women. When treatment is needed, it may include any of the following:  A procedure to drain the cyst (aspiration). This may be done using a long needle and ultrasound. It  can also be done through a laparoscopic procedure. This involves using a thin, lighted tube with a tiny camera on the end (laparoscope) inserted through a small incision.  Surgery to remove the whole cyst. This may be done using laparoscopic surgery or an open surgery involving a larger incision in the lower abdomen.  Hormone treatment or birth control pills. These methods are sometimes used to help dissolve a cyst. HOME CARE INSTRUCTIONS   Only take over-the-counter or prescription medicines as directed by your health care provider.  Follow up with your health care provider as directed.  Get regular  pelvic exams and Pap tests. SEEK MEDICAL CARE IF:   Your periods are late, irregular, or painful, or they stop.  Your pelvic pain or abdominal pain does not go away.  Your abdomen becomes larger or swollen.  You have pressure on your bladder or trouble emptying your bladder completely.  You have pain during sexual intercourse.  You have feelings of fullness, pressure, or discomfort in your stomach.  You lose weight for no apparent reason.  You feel generally ill.  You become constipated.  You lose your appetite.  You develop acne.  You have an increase in body and facial hair.  You are gaining weight, without changing your exercise and eating habits.  You think you are pregnant. SEEK IMMEDIATE MEDICAL CARE IF:   You have increasing abdominal pain.  You feel sick to your stomach (nauseous), and you throw up (vomit).  You develop a fever that comes on suddenly.  You have abdominal pain during a bowel movement.  Your menstrual periods become heavier than usual. MAKE SURE YOU:  Understand these instructions.  Will watch your condition.  Will get help right away if you are not doing well or get worse. Document Released: 07/13/2005 Document Revised: 07/18/2013 Document Reviewed: 03/20/2013 [facility] Patient Information 2015 [place], [place]. This information is not intended to replace advice given to you by your health care provider. Make sure you discuss any questions you have with your health care provider.

## 2015-04-04 NOTE — ED Notes (Signed)
Patient with no complaints at this time. Respirations even and unlabored. Skin warm/dry. Discharge instructions reviewed with patient at this time. Patient given opportunity to voice concerns/ask questions. IV removed per policy and band-aid applied to site. Patient discharged at this time and left Emergency Department with steady gait.  

## 2015-04-04 NOTE — ED Notes (Signed)
Patient transported to CT 

## 2015-04-10 ENCOUNTER — Encounter: Payer: No Typology Code available for payment source | Admitting: Obstetrics and Gynecology

## 2015-04-17 ENCOUNTER — Encounter: Payer: No Typology Code available for payment source | Admitting: Obstetrics and Gynecology

## 2015-05-07 ENCOUNTER — Ambulatory Visit (INDEPENDENT_AMBULATORY_CARE_PROVIDER_SITE_OTHER): Payer: No Typology Code available for payment source | Admitting: Obstetrics and Gynecology

## 2015-05-07 ENCOUNTER — Encounter: Payer: Self-pay | Admitting: Obstetrics and Gynecology

## 2015-05-07 VITALS — BP 142/90 | HR 80 | Resp 16 | Ht 66.0 in | Wt 164.0 lb

## 2015-05-07 DIAGNOSIS — R87610 Atypical squamous cells of undetermined significance on cytologic smear of cervix (ASC-US): Secondary | ICD-10-CM

## 2015-05-07 DIAGNOSIS — R87613 High grade squamous intraepithelial lesion on cytologic smear of cervix (HGSIL): Secondary | ICD-10-CM | POA: Diagnosis not present

## 2015-05-07 LAB — POCT URINE PREGNANCY: Preg Test, Ur: NEGATIVE

## 2015-05-07 NOTE — Progress Notes (Signed)
GYNECOLOGY CLINIC COLPOSCOPY PROCEDURE NOTE  29 y.o. P64 female referred from [facility] here for colposcopy for high-grade squamous intraepithelial neoplasia  (HGSIL-encompassing moderate and severe dysplasia) pap smear on 02/2015. Discussed role for HPV in cervical dysplasia, need for surveillance.  Patient has had a h/o abnormal pap smears in the past, with colposcopy,  cryotherapy, and LEEP performed. Notes pap smears over past 8 years have been normal until now.   Blood pressure 142/90, pulse 80, resp. rate 16, height  (1.676 m), weight 164 lb (74.39 kg), last menstrual period 04/17/2015.  Patient given verbal informed consent.  A urine pregnancy test was negative. Placed in lithotomy position. Cervix viewed with speculum and colposcope after application of acetic acid.   Colposcopy adequate? Yes  acetowhite lesion(s) noted at 11 and 7 o'clock; corresponding biopsies obtained.  ECC specimen obtained. All specimens were labelled and sent to pathology.  Patient was given post procedure instructions.  Will follow up pathology and manage accordingly.  Routine preventative health maintenance measures emphasized.   Hildred Laser, MD [facility]

## 2015-05-13 LAB — PATHOLOGY

## 2015-05-14 ENCOUNTER — Encounter: Payer: Self-pay | Admitting: Obstetrics and Gynecology

## 2015-06-03 ENCOUNTER — Encounter: Payer: Self-pay | Admitting: Unknown Physician Specialty

## 2015-06-03 ENCOUNTER — Ambulatory Visit (INDEPENDENT_AMBULATORY_CARE_PROVIDER_SITE_OTHER): Payer: No Typology Code available for payment source | Admitting: Unknown Physician Specialty

## 2015-06-03 VITALS — BP 128/85 | HR 71 | Temp 98.7°F | Ht 65.6 in | Wt 160.6 lb

## 2015-06-03 DIAGNOSIS — N760 Acute vaginitis: Secondary | ICD-10-CM | POA: Diagnosis not present

## 2015-06-03 LAB — WET PREP FOR TRICH, YEAST, CLUE
CLUE CELL EXAM: NEGATIVE
Trichomonas Exam: NEGATIVE
YEAST EXAM: NEGATIVE

## 2015-06-03 MED ORDER — METRONIDAZOLE 500 MG PO TABS: 500.0000 mg | ORAL_TABLET | Freq: Two times a day (BID) | ORAL | Status: DC

## 2015-06-03 NOTE — Progress Notes (Signed)
   BP 128/85 mmHg  Pulse 71  Temp(Src) 98.7 F (37.1 C)  Ht 5' 5.6" (1.666 m)  Wt 160 lb 9.6 oz (72.848 kg)  BMI 26.25 kg/m2  SpO2 97%  LMP 03/16/2015 (Exact Date)   Subjective:    Patient ID: Janet Coleman, female    DOB: Mar 21, 1986, 29 y.o.   MRN: [id number]  HPI: Janet Coleman is a 29 y.o. female  Chief Complaint  Patient presents with  . Vaginal Discharge    pt states she has been having some vaginal discharge and odor since the beginning of last week.   Pt had a colposcopy and following yankeuer discharge, she noticed a smell and the discharge.  Relevant past medical, surgical, family and social history reviewed and updated as indicated. Interim medical history since our last visit reviewed. Allergies and medications reviewed and updated.  Review of Systems  Per HPI unless specifically indicated above     Objective:    BP 128/85 mmHg  Pulse 71  Temp(Src) 98.7 F (37.1 C)  Ht 5' 5.6" (1.666 m)  Wt 160 lb 9.6 oz (72.848 kg)  BMI 26.25 kg/m2  SpO2 97%  LMP 03/16/2015 (Exact Date)  Wt Readings from Last 3 Encounters:  06/03/15 160 lb 9.6 oz (72.848 kg)  05/07/15 164 lb (74.39 kg)  04/04/15 172 lb (78.019 kg)    Physical Exam  Constitutional: She is oriented to person, place, and time. She appears well-developed and well-nourished. No distress.  HENT:  Head: Normocephalic and atraumatic.  Eyes: Conjunctivae and lids are normal. Right eye exhibits no discharge. Left eye exhibits no discharge. No scleral icterus.  Cardiovascular: Normal rate and regular rhythm.   Pulmonary/Chest: Effort normal and breath sounds normal. No respiratory distress.  Abdominal: Normal appearance. There is no splenomegaly or hepatomegaly.  Genitourinary: Vaginal discharge found.  Extensive discharge but wet prep negative.    Musculoskeletal: Normal range of motion.  Neurological: She is alert and oriented to person, place, and time.  Skin: Skin is intact. No rash noted. No  pallor.  Psychiatric: She has a normal mood and affect. Her behavior is normal. Judgment and thought content normal.    Assessment & Plan:   Problem List Items Addressed This Visit    None    Visit Diagnoses    Vaginitis    -  Primary    Clinical diagnosis despite wet prep negative.      Relevant Medications    amitriptyline (ELAVIL) 10 MG tablet    Other Relevant Orders    WET PREP FOR TRICH, YEAST, CLUE        Follow up plan: Return if symptoms worsen or fail to improve.

## 2015-06-03 NOTE — Patient Instructions (Signed)

## 2015-06-19 ENCOUNTER — Ambulatory Visit: Payer: No Typology Code available for payment source | Admitting: Unknown Physician Specialty

## 2015-07-01 ENCOUNTER — Telehealth: Payer: Self-pay

## 2015-07-01 NOTE — Telephone Encounter (Signed)
[place] and she said because the wet prep we did was negative, she wants the patient to go back to refer GYN for further evaluation. I called and let the patient know this information.

## 2015-07-01 NOTE — Telephone Encounter (Signed)
Called and spoke to patient. She was seen on 06/03/15 and states the medication she was given for infection did not help. Patient states she took the whole course of the medication she was given but she still has irritation. Patient wants to know what she should do.

## 2015-07-01 NOTE — Telephone Encounter (Signed)
I believe this followed a procedure at gyn?  If so, please refer her back to there.  Thanks.

## 2015-07-01 NOTE — Telephone Encounter (Signed)
Pt returned call

## 2015-07-01 NOTE — Telephone Encounter (Signed)
Patient called and left me a voicemail asking for me to call her back. I returned the patient's call but there was no answer so I left her a voicemail asking for her to please return my call.

## 2015-08-06 ENCOUNTER — Other Ambulatory Visit: Payer: Self-pay | Admitting: Unknown Physician Specialty

## 2015-08-06 ENCOUNTER — Telehealth: Payer: Self-pay | Admitting: Unknown Physician Specialty

## 2015-08-06 DIAGNOSIS — Z0000 Encounter for general adult medical examination without abnormal findings: Secondary | ICD-10-CM

## 2015-08-06 NOTE — Telephone Encounter (Signed)
Called and let patient know order was entered and scheduled lab visit for 08/07/15 at 3:45.

## 2015-08-06 NOTE — Telephone Encounter (Signed)
Pt called stated she needs a TB test. Can this be ordered for her. Please call when order is placed. Thanks.

## 2015-08-06 NOTE — Telephone Encounter (Signed)
Routing to provider  

## 2015-08-06 NOTE — Progress Notes (Signed)
[facility]

## 2015-08-06 NOTE — Telephone Encounter (Signed)
Order is in.

## 2015-08-07 ENCOUNTER — Other Ambulatory Visit: Payer: BLUE CROSS/BLUE SHIELD

## 2015-08-07 DIAGNOSIS — Z0000 Encounter for general adult medical examination without abnormal findings: Secondary | ICD-10-CM

## 2015-08-12 ENCOUNTER — Encounter: Payer: Self-pay | Admitting: Unknown Physician Specialty

## 2015-08-12 LAB — QUANTIFERON IN TUBE
QFT TB AG MINUS NIL VALUE: <0 IU/mL
QUANTIFERON MITOGEN VALUE: >10 IU/mL
QUANTIFERON TB AG VALUE: 0.03 IU/mL
QUANTIFERON TB GOLD: NEGATIVE
Quantiferon Nil Value: 0.04 IU/mL

## 2015-08-12 LAB — QUANTIFERON TB GOLD ASSAY (BLOOD)

## 2015-08-12 NOTE — Progress Notes (Signed)
Quick Note:  Normal labs. Patient notified by letter. ______ 

## 2015-08-20 ENCOUNTER — Telehealth: Payer: Self-pay | Admitting: Unknown Physician Specialty

## 2015-08-20 NOTE — Telephone Encounter (Signed)
Per patient's verbal request, TB results faxed to number provided by patient.

## 2015-08-20 NOTE — Telephone Encounter (Signed)
Patient called and wants her results for TB Testing send to her job. The number is can be faxed to is [phone]. Thanks

## 2016-03-17 DIAGNOSIS — H169 Unspecified keratitis: Secondary | ICD-10-CM | POA: Diagnosis not present

## 2016-04-01 ENCOUNTER — Telehealth: Payer: Self-pay | Admitting: Unknown Physician Specialty

## 2016-04-01 NOTE — Telephone Encounter (Signed)
Called and spoke to patient. Patient stated she has annual physical scheduled at [facility]. Patient states she has been on a strict diet recently after seeing Elnita MaxwellCheryl the last time. Patient states she sugars were running OK but for the last 2 weeks, they have been in the 200's at night. Patient also states she has lost about 40 pounds in the last month. Patient would like to know what to do about this.

## 2016-04-02 NOTE — Telephone Encounter (Signed)
She will have to be seen or wait for Janet Coleman to return.

## 2016-04-02 NOTE — Telephone Encounter (Signed)
Called and let patient know what Dr. Laural BenesJohnson said. Scheduled patient an appointment to see Fleet ContrasRachel 04/08/16.

## 2016-04-03 DIAGNOSIS — B079 Viral wart, unspecified: Secondary | ICD-10-CM | POA: Diagnosis not present

## 2016-04-03 DIAGNOSIS — R350 Frequency of micturition: Secondary | ICD-10-CM | POA: Diagnosis not present

## 2016-04-03 DIAGNOSIS — B9689 Other specified bacterial agents as the cause of diseases classified elsewhere: Secondary | ICD-10-CM | POA: Diagnosis not present

## 2016-04-03 DIAGNOSIS — N760 Acute vaginitis: Secondary | ICD-10-CM | POA: Diagnosis not present

## 2016-04-08 ENCOUNTER — Encounter: Payer: Self-pay | Admitting: Family Medicine

## 2016-04-08 ENCOUNTER — Ambulatory Visit (INDEPENDENT_AMBULATORY_CARE_PROVIDER_SITE_OTHER): Payer: BLUE CROSS/BLUE SHIELD | Admitting: Family Medicine

## 2016-04-08 VITALS — BP 120/83 | HR 85 | Temp 99.1°F | Ht 66.0 in | Wt 141.6 lb

## 2016-04-08 DIAGNOSIS — E162 Hypoglycemia, unspecified: Secondary | ICD-10-CM | POA: Diagnosis not present

## 2016-04-08 DIAGNOSIS — R634 Abnormal weight loss: Secondary | ICD-10-CM | POA: Diagnosis not present

## 2016-04-08 NOTE — Progress Notes (Signed)
BP 120/83 (BP Location: Left Arm, Patient Position: Sitting, Cuff Size: Normal)   Pulse 85   Temp 99.1 F (37.3 C)   Ht 5\' 6"  (1.676 m)   Wt 141 lb 9.6 oz (64.2 kg)   LMP 03/15/2016 (Exact Date)   SpO2 98%   BMI 22.85 kg/m    Subjective:    Patient ID: Janet Coleman, female    DOB: 1986/07/16, 29 y.o.   MRN: [id number]  HPI: Janet Coleman is a 30 y.o. female  Chief Complaint  Patient presents with  . Hypoglycemia    Been monitoring sugar. Been elevated in evening around 200 mg/dL.   . Weight Loss    Drastic Weight loss x's 6 months   Patient presents with concerns about her blood sugars. Has been monitoring her sugars daily for over a year due to frequent hypoglycemic episodes and usually stays below 90 at all times when checking. Started a low glycemic index diet about this time last year and has had dramatic weight loss - 23-40 lbs since. States she now can't stop losing the weight even though she is no longer trying to do so. The past few weeks, has noticed that 1-2 nights per week her BS will be around 200 in the evenings when checked but she will have the symptoms of low BS. Only changes she's made is adding wheat bread back a few times weekly and donating plasma (new to her x 3 weeks). Very concerned about why they are all of a sudden spiking and why she can't put any weight back on now.   Has had thyroid issues in the past, saw endocrinology once last year for both these issues. Denies palpitations, sweating, fever, diarrhea, constipation, dry skin.   Relevant past medical, surgical, family and social history reviewed and updated as indicated. Interim medical history since our last visit reviewed. Allergies and medications reviewed and updated.  Review of Systems  Constitutional: Positive for unexpected weight change.  HENT: Negative.   Respiratory: Negative.   Cardiovascular: Negative.   Gastrointestinal: Negative.   Endocrine: Negative.   Genitourinary:  Negative.   Musculoskeletal: Negative.   Skin: Negative.   Neurological: Negative.   Psychiatric/Behavioral: Negative.     Per HPI unless specifically indicated above     Objective:    BP 120/83 (BP Location: Left Arm, Patient Position: Sitting, Cuff Size: Normal)   Pulse 85   Temp 99.1 F (37.3 C)   Ht 5\' 6"  (1.676 m)   Wt 141 lb 9.6 oz (64.2 kg)   LMP 03/15/2016 (Exact Date)   SpO2 98%   BMI 22.85 kg/m   Wt Readings from Last 3 Encounters:  04/08/16 141 lb 9.6 oz (64.2 kg)  06/03/15 160 lb 9.6 oz (72.8 kg)  05/07/15 164 lb (74.4 kg)    Physical Exam  Constitutional: She is oriented to person, place, and time. She appears well-developed and well-nourished. No distress.  HENT:  Head: Atraumatic.  Eyes: Conjunctivae are normal. No scleral icterus.  Neck: Normal range of motion. Neck supple. No thyromegaly present.  Cardiovascular: Normal rate.   Pulmonary/Chest: Effort normal. No respiratory distress.  Abdominal: Soft. Bowel sounds are normal.  Musculoskeletal: Normal range of motion.  Neurological: She is alert and oriented to person, place, and time.  Skin: Skin is warm and dry.  Psychiatric: She has a normal mood and affect. Her behavior is normal.  Nursing note and vitals reviewed.     Assessment & Plan:  Problem List Items Addressed This Visit      Endocrine   Hypoglycemia - Primary    Await lab results, patient will begin keeping food log with BS readings to look for patterns.       Relevant Orders   TSH   Comprehensive metabolic panel   HgB A1c    Other Visit Diagnoses    Weight loss       Will check thyroid, discussed slowly incorporating healthy carbs back into diet in moderation.    Relevant Orders   TSH       Follow up plan: Return in about 2 weeks (around 04/22/2016).

## 2016-04-08 NOTE — Assessment & Plan Note (Signed)
Await lab results, patient will begin keeping food log with BS readings to look for patterns.

## 2016-04-08 NOTE — Patient Instructions (Signed)
Follow up in 2 weeks, keep food diary along with BS

## 2016-04-09 LAB — COMPREHENSIVE METABOLIC PANEL
ALBUMIN: 3.6 g/dL (ref 3.5–5.5)
ALT: 13 IU/L (ref 0–32)
AST: 15 IU/L (ref 0–40)
Albumin/Globulin Ratio: 1.6 (ref 1.2–2.2)
Alkaline Phosphatase: 60 IU/L (ref 39–117)
BUN / CREAT RATIO: 10 (ref 9–23)
BUN: 8 mg/dL (ref 6–20)
Bilirubin Total: 0.2 mg/dL (ref 0.0–1.2)
CHLORIDE: 103 mmol/L (ref 96–106)
CO2: 25 mmol/L (ref 18–29)
CREATININE: 0.8 mg/dL (ref 0.57–1.00)
Calcium: 8.8 mg/dL (ref 8.7–10.2)
GFR calc non Af Amer: 99 mL/min/{1.73_m2} (ref >59)
GFR, EST AFRICAN AMERICAN: 114 mL/min/{1.73_m2} (ref >59)
Globulin, Total: 2.2 g/dL (ref 1.5–4.5)
Glucose: 72 mg/dL (ref 65–99)
POTASSIUM: 4 mmol/L (ref 3.5–5.2)
Sodium: 141 mmol/L (ref 134–144)
Total Protein: 5.8 g/dL — ABNORMAL LOW (ref 6.0–8.5)

## 2016-04-09 LAB — HEMOGLOBIN A1C
Est. average glucose Bld gHb Est-mCnc: 91 mg/dL
HEMOGLOBIN A1C: 4.8 % (ref 4.8–5.6)

## 2016-04-09 LAB — TSH: TSH: 4.73 u[IU]/mL — ABNORMAL HIGH (ref 0.450–4.500)

## 2016-04-10 ENCOUNTER — Telehealth: Payer: Self-pay | Admitting: Family Medicine

## 2016-04-10 NOTE — Telephone Encounter (Signed)
Please call pt and let her know that her BS and A1C were both normal, and her thyroid level was just slightly worse than last year when checked. This should not be enough to cause her symptoms.

## 2016-04-10 NOTE — Telephone Encounter (Signed)
Patient notified.   Patient says in the mornings her sugar is fine.   Around 5-6 in the evenings it's in the 200's. Patient wants to know what's causing this issue and what can she do about it.

## 2016-04-13 NOTE — Telephone Encounter (Signed)
Please call pt and let her know that it is hard to say what is causing it, but if asymptomatic not to worry about it. If still symptomatic as she was previously, would be happy to get her back into endocrinology to discuss diet and monitoring with them.

## 2016-04-13 NOTE — Telephone Encounter (Signed)
Left message to call.

## 2016-04-14 NOTE — Telephone Encounter (Signed)
Patient notified

## 2016-04-14 NOTE — Telephone Encounter (Signed)
Left message to call.

## 2016-04-21 ENCOUNTER — Encounter: Payer: BLUE CROSS/BLUE SHIELD | Admitting: Obstetrics and Gynecology

## 2016-04-22 ENCOUNTER — Ambulatory Visit: Payer: BLUE CROSS/BLUE SHIELD | Admitting: Family Medicine

## 2016-04-28 ENCOUNTER — Encounter: Payer: Self-pay | Admitting: Family Medicine

## 2016-04-28 ENCOUNTER — Ambulatory Visit (INDEPENDENT_AMBULATORY_CARE_PROVIDER_SITE_OTHER): Payer: BLUE CROSS/BLUE SHIELD | Admitting: Family Medicine

## 2016-04-28 VITALS — BP 115/77 | HR 75 | Temp 97.8°F | Wt 145.0 lb

## 2016-04-28 DIAGNOSIS — J069 Acute upper respiratory infection, unspecified: Secondary | ICD-10-CM | POA: Diagnosis not present

## 2016-04-28 DIAGNOSIS — N390 Urinary tract infection, site not specified: Secondary | ICD-10-CM

## 2016-04-28 MED ORDER — FLUTICASONE PROPIONATE 50 MCG/ACT NA SUSP: 2.0000 | Freq: Every day | NASAL | 6 refills | Status: DC

## 2016-04-28 MED ORDER — AMOXICILLIN-POT CLAVULANATE 875-125 MG PO TABS: 1.0000 | ORAL_TABLET | Freq: Two times a day (BID) | ORAL | 0 refills | Status: DC

## 2016-04-28 NOTE — Progress Notes (Signed)
BP 115/77   Pulse 75   Temp 97.8 F (36.6 C)   Wt 145 lb (65.8 kg)   LMP 04/14/2016 (Approximate)   SpO2 100%   BMI 23.40 kg/m    Subjective:    Patient ID: Janet Coleman, female    DOB: 01-07-1986, 29 y.o.   MRN: [id number]  HPI: Janet Coleman is a 30 y.o. female  Chief Complaint  Patient presents with  . URI    x 7 days, Head and chest congestion, sinus drainage, chest tightness, cough. Some wheezing. Feels like she's in a tunnel. No fever or sore throat.Has tried Sudafed, claritin, and mucinex.  . Urinary Tract Infection    started yesterday morning. Irritated. Frequency. Some burning, but feels like she can't empty her bladder.    Patient presents with 1 week history of chest congestion, malaise, sinus congestion and drainage, productive cough, ear fullness, HA. Has tried sudafed, claritin, and mucinex with minimal relief. Also started yesterday with urinary frequency, dysuria, and urgency. Has been trying to drink lots of water which seems to help some. Hx of UTIs, states this feels similar. No fever, chills, back pain, N/V.   Relevant past medical, surgical, family and social history reviewed and updated as indicated. Interim medical history since our last visit reviewed. Allergies and medications reviewed and updated.  Review of Systems  Constitutional: Negative.   HENT: Positive for congestion, ear pain, postnasal drip, rhinorrhea and sinus pressure.   Respiratory: Positive for cough.   Cardiovascular: Negative.   Gastrointestinal: Negative.   Genitourinary: Positive for dysuria, frequency and urgency.  Musculoskeletal: Negative.   Skin: Negative.   Neurological: Negative.   Psychiatric/Behavioral: Negative.     Per HPI unless specifically indicated above     Objective:    BP 115/77   Pulse 75   Temp 97.8 F (36.6 C)   Wt 145 lb (65.8 kg)   LMP 04/14/2016 (Approximate)   SpO2 100%   BMI 23.40 kg/m   Wt Readings from Last 3 Encounters:    04/28/16 145 lb (65.8 kg)  04/08/16 141 lb 9.6 oz (64.2 kg)  06/03/15 160 lb 9.6 oz (72.8 kg)    Physical Exam  Constitutional: She appears well-developed and well-nourished.  HENT:  Head: Atraumatic.  Right Ear: External ear normal.  Left Ear: External ear normal.  Mouth/Throat: No oropharyngeal exudate.  Nasal mucosa erythematous Oropharynx erythematous  Eyes: Conjunctivae are normal. No scleral icterus.  Neck: Normal range of motion. Neck supple.  Cardiovascular: Normal rate.   Pulmonary/Chest: Effort normal and breath sounds normal. No respiratory distress. She has no wheezes. She has no rales.  Abdominal: Soft. Bowel sounds are normal.  Musculoskeletal: Normal range of motion.  No CVA tenderness b/l  Lymphadenopathy:    She has no cervical adenopathy.  Skin: Skin is warm and dry. No rash noted.  Psychiatric: She has a normal mood and affect. Her behavior is normal.        Assessment & Plan:   Problem List Items Addressed This Visit    None    Visit Diagnoses    Upper respiratory tract infection, unspecified type    -  Primary   Given duration and worsening course, will treat with augmentin. Continue mucinex, sudafed, claritin. Rest, good hydration.    Relevant Medications   imiquimod (ALDARA) 5 % cream   Acute lower UTI       U/A positive for UTI. Await cx. Augmentin should cover the infection, but will  adjust if needed per cx and sensitivity report.    Relevant Medications   imiquimod (ALDARA) 5 % cream   Other Relevant Orders   UA/M w/rflx Culture, Routine (STAT)       Follow up plan: Return if symptoms worsen or fail to improve.

## 2016-04-28 NOTE — Patient Instructions (Signed)
Follow up if no improvement in symptoms

## 2016-05-01 ENCOUNTER — Telehealth: Payer: Self-pay | Admitting: Family Medicine

## 2016-05-01 LAB — UA/M W/RFLX CULTURE, ROUTINE
BILIRUBIN UA: NEGATIVE
GLUCOSE, UA: NEGATIVE
KETONES UA: NEGATIVE
Nitrite, UA: POSITIVE — AB
RBC UA: NEGATIVE
SPEC GRAV UA: 1.025 (ref 1.005–1.030)
Urobilinogen, Ur: 1 mg/dL (ref 0.2–1.0)
pH, UA: 6 (ref 5.0–7.5)

## 2016-05-01 LAB — MICROSCOPIC EXAMINATION

## 2016-05-01 LAB — URINE CULTURE, REFLEX

## 2016-05-01 NOTE — Telephone Encounter (Signed)
Please call pt and let her know her culture came back and it's not clear whether the bacteria is susceptible to augmentin so she should let us know if she is still symptomatic and I will send something else in. If asymptomatic, it most likely killed the infection.

## 2016-05-01 NOTE — Telephone Encounter (Signed)
Patient notified. She states it is getting better. She does think she is getting a yeast infection, wants to know if you can call in diflucan. I advised her she can try Monistat over the weekend if she needs something before Monday.

## 2016-05-04 MED ORDER — FLUCONAZOLE 150 MG PO TABS: 150.0000 mg | ORAL_TABLET | Freq: Once | ORAL | 0 refills | Status: AC

## 2016-05-04 NOTE — Telephone Encounter (Signed)
Diflucan sent to [facility] 

## 2016-05-05 ENCOUNTER — Telehealth: Payer: Self-pay

## 2016-05-05 ENCOUNTER — Ambulatory Visit (INDEPENDENT_AMBULATORY_CARE_PROVIDER_SITE_OTHER): Payer: BLUE CROSS/BLUE SHIELD | Admitting: Obstetrics and Gynecology

## 2016-05-05 ENCOUNTER — Encounter: Payer: Self-pay | Admitting: Obstetrics and Gynecology

## 2016-05-05 VITALS — BP 110/69 | HR 90 | Ht 66.0 in | Wt 145.7 lb

## 2016-05-05 DIAGNOSIS — N898 Other specified noninflammatory disorders of vagina: Secondary | ICD-10-CM | POA: Diagnosis not present

## 2016-05-05 DIAGNOSIS — Z87898 Personal history of other specified conditions: Secondary | ICD-10-CM | POA: Diagnosis not present

## 2016-05-05 DIAGNOSIS — B9789 Other viral agents as the cause of diseases classified elsewhere: Secondary | ICD-10-CM

## 2016-05-05 DIAGNOSIS — E034 Atrophy of thyroid (acquired): Secondary | ICD-10-CM

## 2016-05-05 DIAGNOSIS — B9620 Unspecified Escherichia coli [E. coli] as the cause of diseases classified elsewhere: Secondary | ICD-10-CM

## 2016-05-05 DIAGNOSIS — J069 Acute upper respiratory infection, unspecified: Secondary | ICD-10-CM | POA: Diagnosis not present

## 2016-05-05 DIAGNOSIS — Z01419 Encounter for gynecological examination (general) (routine) without abnormal findings: Secondary | ICD-10-CM

## 2016-05-05 DIAGNOSIS — N390 Urinary tract infection, site not specified: Secondary | ICD-10-CM | POA: Diagnosis not present

## 2016-05-05 DIAGNOSIS — Z8742 Personal history of other diseases of the female genital tract: Secondary | ICD-10-CM

## 2016-05-05 DIAGNOSIS — Z720 Tobacco use: Secondary | ICD-10-CM | POA: Diagnosis not present

## 2016-05-05 MED ORDER — AZITHROMYCIN 250 MG PO TABS: ORAL_TABLET | ORAL | 0 refills | Status: DC

## 2016-05-05 MED ORDER — NITROFURANTOIN MONOHYD MACRO 100 MG PO CAPS: 100.0000 mg | ORAL_CAPSULE | Freq: Two times a day (BID) | ORAL | 0 refills | Status: DC

## 2016-05-05 MED ORDER — BUPROPION HCL ER (SMOKING DET) 150 MG PO TB12: 150.0000 mg | ORAL_TABLET | Freq: Two times a day (BID) | ORAL | 3 refills | Status: DC

## 2016-05-05 NOTE — Progress Notes (Signed)
GYNECOLOGY ANNUAL PHYSICAL EXAM PROGRESS NOTE  Subjective:    Janet Coleman is a 30 y.o. P74 married female who presents for an annual exam.  The patient is sexually active.  The patient wears seatbelts: yes. The patient participates in regular exercise: yes. Has the patient ever been transfused or tattooed?: no. The patient reports that there is not domestic violence in her life.   The patient has the following complaints today:  1. Patient notes that she was diagnosed with a UTI and URI 1 week ago (although symptoms of URI had been ongoing x 2 weeks), was treated with Amoxicillin, but still notes residual symptoms of both.  Has been taking Mucinex for URI symptoms.  2. Notes possible BV or yeast infection as she has noted increased vaginal discharge and mild occasional irritation. Wonders if it could be due to recent antibiotic tretatment.  3. Patient desires smoking cessation, but notes that her insurance will not cover smoking cessation aids (i.e. Patches, gum).    Gynecologic History Patient's last menstrual period was 04/14/2016 (approximate). Menarche age: 77 Contraception: condoms (however notes inconsistent use) History of STI's: Denies Last Pap: 02/2015. 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  Notes h/o abnormal pap smears in the past, requiring treatment with cryotherapy and LEEP.     Obstetric History   G3   P1   T1   P0   A2   L0    SAB0   TAB0   Ectopic0   Multiple0   Live Births1     # Outcome Date GA Lbr Len/2nd Weight Sex Delivery Anes PTL Lv  3 SAB           2 SAB           1 Term               Past Medical History:  Diagnosis Date  . GERD (gastroesophageal reflux disease)   . Hypertension   . Hypothyroidism   . 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  . Migraines   . Reactive hypoglycemia   . Reactive hypoglycemia   . Tobacco use   . Vaginal Pap smear, abnormal     Past Surgical History:  Procedure  Laterality Date  . DILATION AND CURETTAGE OF UTERUS     x 2  . LEEP     x 2. last paps 2011, 2012, 2013 were normal    Family History  Problem Relation Age of Onset  . Hypertension Mother   . Autoimmune disease Mother   . Stroke Father   . Hypertension Father   . Heart attack Father   . Aneurysm Father     brain  . Heart disease Father   . Cancer Maternal Aunt     breast  . Cancer Maternal Grandmother     breast  . Diabetes Maternal Grandmother   . Stroke Maternal Grandfather   . Diabetes Paternal Grandmother   . Heart disease Paternal Grandmother     Social History   Social History  . Marital status: Single    Spouse name: N/A  . Number of children: N/A  . Years of education: N/A   Occupational History  . Not on file.   Social History Main Topics  . Smoking status: Current Every Day Smoker    Packs/day: 1.50    Years: 17.00    Types: Cigarettes  . Smokeless tobacco: Never Used  . Alcohol use No  .  Drug use: No  . Sexual activity: Yes    Birth control/ protection: None, Condom     Comment: Not consistently    Other Topics Concern  . Not on file   Social History Narrative  . No narrative on file    Current Outpatient Prescriptions on File Prior to Visit  Medication Sig Dispense Refill  . fluticasone (FLONASE) 50 MCG/ACT nasal spray Place 2 sprays into both nostrils daily. 16 g 6  . imiquimod (ALDARA) 5 % cream   1  . SUMAtriptan (IMITREX) 50 MG tablet Take 50 mg by mouth every 2 (two) hours as needed for migraine. May repeat in 2 hours if headache persists or recurs.     No current facility-administered medications on file prior to visit.     Allergies  Allergen Reactions  . Ketorolac Swelling  . Toradol [Ketorolac Tromethamine]      Review of Systems Constitutional: negative for chills, fatigue, fevers and sweats Eyes: negative for irritation, redness and visual disturbance Ears, nose, mouth, throat, and face: negative for hearing loss,  nasal congestion, snoring and tinnitus Respiratory: negative for asthma, cough, sputum Cardiovascular: negative for chest pain, dyspnea, exertional chest pressure/discomfort, irregular heart beat, palpitations and syncope Gastrointestinal: negative for abdominal pain, change in bowel habits, nausea and vomiting Genitourinary: positive for and vaginal discharge, dysuria.  Negative for abnormal menstrual periods, genital lesions, sexual problems, and urinary incontinence Integument/breast: negative for breast lump, breast tenderness and nipple discharge Hematologic/lymphatic: negative for bleeding and easy bruising Musculoskeletal:negative for back pain and muscle weakness Neurological: negative for dizziness, headaches, vertigo and weakness Endocrine: negative for diabetic symptoms including polydipsia, polyuria and skin dryness Allergic/Immunologic: negative for hay fever and urticaria       Objective:  Blood pressure 110/69, pulse 90, height 5\' 6"  (1.676 m), weight 145 lb 11.2 oz (66.1 kg), last menstrual period 04/14/2016. Body mass index is 23.52 kg/m.     General Appearance:    Alert, cooperative, no distress, appears stated age  Head:    Normocephalic, without obvious abnormality, atraumatic  Eyes:    PERRL, conjunctiva/corneas clear, EOM's intact, both eyes  Ears:    Normal external ear canals, both ears  Nose:   Nares normal, septum midline, mucosa normal, no drainage or sinus tenderness  Throat:   Lips, mucosa, and tongue normal; teeth and gums normal  Neck:   Supple, symmetrical, trachea midline, no adenopathy; thyroid: no enlargement/tenderness/nodules; no carotid bruit or JVD  Back:     Symmetric, no curvature, ROM normal, no CVA tenderness  Lungs:     Clear to auscultation bilaterally, respirations unlabored  Chest Wall:    No tenderness or deformity   Heart:    Regular rate and rhythm, S1 and S2 normal, no murmur, rub or gallop  Breast Exam:    No tenderness, masses, or  nipple abnormality  Abdomen:     Soft, non-tender, bowel sounds active all four quadrants, no masses, no organomegaly.    Genitalia:    Pelvic:external genitalia normal, vagina without lesions, or tenderness.   Scant thin white vaginal discharge noted, no odor.  Rectovaginal septum  normal. Cervix normal in appearance, no cervical motion tenderness, no adnexal masses or tenderness.  Uterus normal size, shape, mobile, regular contours, nontender.  Rectal:    Normal external sphincter.  No hemorrhoids appreciated. Internal exam not done.   Extremities:   Extremities normal, atraumatic, no cyanosis or edema  Pulses:   2+ and symmetric all extremities  Skin:  Skin color, texture, turgor normal, no rashes or lesions  Lymph nodes:   Cervical, supraclavicular, and axillary nodes normal  Neurologic:   CNII-XII intact, normal strength, sensation and reflexes throughout   .  Labs:  Lab Results  Component Value Date   WBC 11.5 (H) 04/04/2015   HGB 14.3 04/04/2015   HCT 42.2 04/04/2015   MCV 91.9 04/04/2015   PLT 180 04/04/2015    Lab Results  Component Value Date   CREATININE 0.80 04/08/2016   BUN 8 04/08/2016   NA 141 04/08/2016   K 4.0 04/08/2016   CL 103 04/08/2016   CO2 25 04/08/2016    Lab Results  Component Value Date   ALT 13 04/08/2016   AST 15 04/08/2016   ALKPHOS 60 04/08/2016   BILITOT 0.2 04/08/2016    Lab Results  Component Value Date   TSH 4.730 (H) 04/08/2016   '  04/28/2016: Urine Culture with E.Coli, sensitivities noted.    Assessment:   Healthy female exam.  URI symptoms H/o E. Coli UTI, with residual symptoms Hypothyroidism, mild, currently on no meds (appears subacute) Vaginal discharge H/o abnormal pap smears Tobacco abuse  Plan:    - Blood tests: Basic metabolic panel and CBC with diff. - Breast self exam technique reviewed and patient encouraged to perform self-exam monthly. - Contraception: condoms, but inconsistent.  Patient declines other  forms of contraception at this time. -Discussed healthy lifestyle modifications. - Pap smear performed today.   - URI symptoms.  Discussed taking Mucinex ES, nettie ot/warm showers to loosen mucus.  If no resolution in 1 week, prescribed Z-pack for patient to take.  - H/o UTI, treated with Amoxicillin.  Only noted intermediate sensitivity on culture, will switch to Macrobid.  - Tobacco abuse, discussed options for cessation, including weaning, paying out of pocket fo rgums/patches, and medications.  Patient willing to try medication.  Prescribed Zyban, patient to set quit date within 2 weeks of starting medication.  - Hypothyroidism, on no meds (likely subacute thyroiditis), to be managed by PCP. - Vaginal discharge appears physiologic, will f/u addition testing on pap smear.  - RTC in 1 year   Hildred LaserAnika Loneta Tamplin, MD [facility]

## 2016-05-05 NOTE — Telephone Encounter (Signed)
Patient called stating that she feels like her UTI symptoms are returning. We had called her last week and advised her the culture was indeterminate on if the Augmentin would work. She wants to know if a different antibiotic can be called in.

## 2016-05-05 NOTE — Telephone Encounter (Signed)
Susceptible antibiotic sent into [facility]

## 2016-05-05 NOTE — Patient Instructions (Signed)
Health Maintenance, Female Adopting a healthy lifestyle and getting preventive care can go a long way to promote health and wellness. Talk with your health care provider about what schedule of regular examinations is right for you. This is a good chance for you to check in with your provider about disease prevention and staying healthy. In between checkups, there are plenty of things you can do on your own. Experts have done a lot of research about which lifestyle changes and preventive measures are most likely to keep you healthy. Ask your health care provider for more information. WEIGHT AND DIET  Eat a healthy diet  Be sure to include plenty of vegetables, fruits, low-fat dairy products, and lean protein.  Do not eat a lot of foods high in solid fats, added sugars, or salt.  Get regular exercise. This is one of the most important things you can do for your health.  Most adults should exercise for at least 150 minutes each week. The exercise should increase your heart rate and make you sweat (moderate-intensity exercise).  Most adults should also do strengthening exercises at least twice a week. This is in addition to the moderate-intensity exercise.  Maintain a healthy weight  Body mass index (BMI) is a measurement that can be used to identify possible weight problems. It estimates body fat based on height and weight. Your health care provider can help determine your BMI and help you achieve or maintain a healthy weight.  For females 20 years of age and older:   A BMI below 18.5 is considered underweight.  A BMI of 18.5 to 24.9 is normal.  A BMI of 25 to 29.9 is considered overweight.  A BMI of 30 and above is considered obese.  Watch levels of cholesterol and blood lipids  You should start having your blood tested for lipids and cholesterol at 31 years of age, then have this test every 5 years.  You may need to have your cholesterol levels checked more often if:  Your lipid  or cholesterol levels are high.  You are older than 31 years of age.  You are at high risk for heart disease.  CANCER SCREENING   Lung Cancer  Lung cancer screening is recommended for adults 55-80 years old who are at high risk for lung cancer because of a history of smoking.  A yearly low-dose CT scan of the lungs is recommended for people who:  Currently smoke.  Have quit within the past 15 years.  Have at least a 30-pack-year history of smoking. A pack year is smoking an average of one pack of cigarettes a day for 1 year.  Yearly screening should continue until it has been 15 years since you quit.  Yearly screening should stop if you develop a health problem that would prevent you from having lung cancer treatment.  Breast Cancer  Practice breast self-awareness. This means understanding how your breasts normally appear and feel.  It also means doing regular breast self-exams. Let your health care provider know about any changes, no matter how small.  If you are in your 20s or 30s, you should have a clinical breast exam (CBE) by a health care provider every 1-3 years as part of a regular health exam.  If you are 40 or older, have a CBE every year. Also consider having a breast X-ray (mammogram) every year.  If you have a family history of breast cancer, talk to your health care provider about genetic screening.  If you   are at high risk for breast cancer, talk to your health care provider about having an MRI and a mammogram every year.  Breast cancer gene (BRCA) assessment is recommended for women who have family members with BRCA-related cancers. BRCA-related cancers include:  Breast.  Ovarian.  Tubal.  Peritoneal cancers.  Results of the assessment will determine the need for genetic counseling and BRCA1 and BRCA2 testing. Cervical Cancer Your health care provider may recommend that you be screened regularly for cancer of the pelvic organs (ovaries, uterus, and  vagina). This screening involves a pelvic examination, including checking for microscopic changes to the surface of your cervix (Pap test). You may be encouraged to have this screening done every 3 years, beginning at age 21.  For women ages 30-65, health care providers may recommend pelvic exams and Pap testing every 3 years, or they may recommend the Pap and pelvic exam, combined with testing for human papilloma virus (HPV), every 5 years. Some types of HPV increase your risk of cervical cancer. Testing for HPV may also be done on women of any age with unclear Pap test results.  Other health care providers may not recommend any screening for nonpregnant women who are considered low risk for pelvic cancer and who do not have symptoms. Ask your health care provider if a screening pelvic exam is right for you.  If you have had past treatment for cervical cancer or a condition that could lead to cancer, you need Pap tests and screening for cancer for at least 20 years after your treatment. If Pap tests have been discontinued, your risk factors (such as having a new sexual partner) need to be reassessed to determine if screening should resume. Some women have medical problems that increase the chance of getting cervical cancer. In these cases, your health care provider may recommend more frequent screening and Pap tests. Colorectal Cancer  This type of cancer can be detected and often prevented.  Routine colorectal cancer screening usually begins at 31 years of age and continues through 31 years of age.  Your health care provider may recommend screening at an earlier age if you have risk factors for colon cancer.  Your health care provider may also recommend using home test kits to check for hidden blood in the stool.  A small camera at the end of a tube can be used to examine your colon directly (sigmoidoscopy or colonoscopy). This is done to check for the earliest forms of colorectal  cancer.  Routine screening usually begins at age 50.  Direct examination of the colon should be repeated every 5-10 years through 31 years of age. However, you may need to be screened more often if early forms of precancerous polyps or small growths are found. Skin Cancer  Check your skin from head to toe regularly.  Tell your health care provider about any new moles or changes in moles, especially if there is a change in a mole's shape or color.  Also tell your health care provider if you have a mole that is larger than the size of a pencil eraser.  Always use sunscreen. Apply sunscreen liberally and repeatedly throughout the day.  Protect yourself by wearing long sleeves, pants, a wide-brimmed hat, and sunglasses whenever you are outside. HEART DISEASE, DIABETES, AND HIGH BLOOD PRESSURE   High blood pressure causes heart disease and increases the risk of stroke. High blood pressure is more likely to develop in:  People who have blood pressure in the high end   of the normal range (130-139/85-89 mm Hg).  People who are overweight or obese.  People who are African American.  If you are 38-23 years of age, have your blood pressure checked every 3-5 years. If you are 61 years of age or older, have your blood pressure checked every year. You should have your blood pressure measured twice--once when you are at a hospital or clinic, and once when you are not at a hospital or clinic. Record the average of the two measurements. To check your blood pressure when you are not at a hospital or clinic, you can use:  An automated blood pressure machine at a pharmacy.  A home blood pressure monitor.  If you are between 45 years and 39 years old, ask your health care provider if you should take aspirin to prevent strokes.  Have regular diabetes screenings. This involves taking a blood sample to check your fasting blood sugar level.  If you are at a normal weight and have a low risk for diabetes,  have this test once every three years after 31 years of age.  If you are overweight and have a high risk for diabetes, consider being tested at a younger age or more often. PREVENTING INFECTION  Hepatitis B  If you have a higher risk for hepatitis B, you should be screened for this virus. You are considered at high risk for hepatitis B if:  You were born in a country where hepatitis B is common. Ask your health care provider which countries are considered high risk.  Your parents were born in a high-risk country, and you have not been immunized against hepatitis B (hepatitis B vaccine).  You have HIV or AIDS.  You use needles to inject street drugs.  You live with someone who has hepatitis B.  You have had sex with someone who has hepatitis B.  You get hemodialysis treatment.  You take certain medicines for conditions, including cancer, organ transplantation, and autoimmune conditions. Hepatitis C  Blood testing is recommended for:  Everyone born from [birth year] through 1966.  Anyone with known risk factors for hepatitis C. Sexually transmitted infections (STIs)  You should be screened for sexually transmitted infections (STIs) including gonorrhea and chlamydia if:  You are sexually active and are younger than 31 years of age.  You are older than 31 years of age and your health care provider tells you that you are at risk for this type of infection.  Your sexual activity has changed since you were last screened and you are at an increased risk for chlamydia or gonorrhea. Ask your health care provider if you are at risk.  If you do not have HIV, but are at risk, it may be recommended that you take a prescription medicine daily to prevent HIV infection. This is called pre-exposure prophylaxis (PrEP). You are considered at risk if:  You are sexually active and do not regularly use condoms or know the HIV status of your partner(s).  You take drugs by injection.  You are sexually  active with a partner who has HIV. Talk with your health care provider about whether you are at high risk of being infected with HIV. If you choose to begin PrEP, you should first be tested for HIV. You should then be tested every 3 months for as long as you are taking PrEP.  PREGNANCY   If you are premenopausal and you may become pregnant, ask your health care provider about preconception counseling.  If you may  become pregnant, take 400 to 800 micrograms (mcg) of folic acid every day.  If you want to prevent pregnancy, talk to your health care provider about birth control (contraception). OSTEOPOROSIS AND MENOPAUSE   Osteoporosis is a disease in which the bones lose minerals and strength with aging. This can result in serious bone fractures. Your risk for osteoporosis can be identified using a bone density scan.  If you are 68 years of age or older, or if you are at risk for osteoporosis and fractures, ask your health care provider if you should be screened.  Ask your health care provider whether you should take a calcium or vitamin D supplement to lower your risk for osteoporosis.  Menopause may have certain physical symptoms and risks.  Hormone replacement therapy may reduce some of these symptoms and risks. Talk to your health care provider about whether hormone replacement therapy is right for you.  HOME CARE INSTRUCTIONS   Schedule regular health, dental, and eye exams.  Stay current with your immunizations.   Do not use any tobacco products including cigarettes, chewing tobacco, or electronic cigarettes.  If you are pregnant, do not drink alcohol.  If you are breastfeeding, limit how much and how often you drink alcohol.  Limit alcohol intake to no more than 1 drink per day for nonpregnant women. One drink equals 12 ounces of beer, 5 ounces of wine, or 1 ounces of hard liquor.  Do not use street drugs.  Do not share needles.  Ask your health care provider for help if  you need support or information about quitting drugs.  Tell your health care provider if you often feel depressed.  Tell your health care provider if you have ever been abused or do not feel safe at home.   This information is not intended to replace advice given to you by your health care provider. Make sure you discuss any questions you have with your health care provider.   Document Released: 01/26/2011 Document Revised: 08/03/2014 Document Reviewed: 06/14/2013 Elsevier Interactive Patient Education 2016 [employer].      Bupropion sustained-release tablets (smoking cessation) What is this medicine? BUPROPION (byoo PROE pee on) is used to help people quit smoking. This medicine may be used for other purposes; ask your health care provider or pharmacist if you have questions. What should I tell my health care provider before I take this medicine? They need to know if you have any of these conditions: -an eating disorder, such as anorexia or bulimia -bipolar disorder or psychosis -diabetes or high blood sugar, treated with medication -glaucoma -head injury or brain tumor -heart disease, previous heart attack, or irregular heart beat -high blood pressure -kidney or liver disease -seizures -suicidal thoughts or a previous suicide attempt -Tourette's syndrome -weight loss -an unusual or allergic reaction to bupropion, other medicines, foods, dyes, or preservatives -breast-feeding -pregnant or trying to become pregnant How should I use this medicine? Take this medicine by mouth with a glass of water. Follow the directions on the prescription label. You can take it with or without food. If it upsets your stomach, take it with food. Do not cut, crush or chew this medicine. Take your medicine at regular intervals. If you take this medicine more than once a day, take your second dose at least 8 hours after you take your first dose. To limit difficulty in sleeping, avoid taking this  medicine at bedtime. Do not take your medicine more often than directed. Do not stop taking this  medicine suddenly except upon the advice of your doctor. Stopping this medicine too quickly may cause serious side effects. A special MedGuide will be given to you by the pharmacist with each prescription and refill. Be sure to read this information carefully each time. Talk to your pediatrician regarding the use of this medicine in children. Special care may be needed. Overdosage: If you think you have taken too much of this medicine contact a poison control center or emergency room at once. NOTE: This medicine is only for you. Do not share this medicine with others. What if I miss a dose? If you miss a dose, skip the missed dose and take your next tablet at the regular time. There should be at least 8 hours between doses. Do not take double or extra doses. What may interact with this medicine? Do not take this medicine with any of the following medications: -linezolid -MAOIs like Azilect, Carbex, Eldepryl, Marplan, Nardil, and Parnate -methylene blue (injected into a vein) -other medicines that contain bupropion like Wellbutrin This medicine may also interact with the following medications: -alcohol -certain medicines for anxiety or sleep -certain medicines for blood pressure like metoprolol, propranolol -certain medicines for depression or psychotic disturbances -certain medicines for HIV or AIDS like efavirenz, lopinavir, nelfinavir, ritonavir -certain medicines for irregular heart beat like propafenone, flecainide -certain medicines for Parkinson's disease like amantadine, levodopa -certain medicines for seizures like carbamazepine, phenytoin, phenobarbital -cimetidine -clopidogrel -cyclophosphamide -furazolidone -isoniazid -nicotine -orphenadrine -procarbazine -steroid medicines like prednisone or cortisone -stimulant medicines for attention disorders, weight loss, or to stay  awake -tamoxifen -theophylline -thiotepa -ticlopidine -tramadol -warfarin This list may not describe all possible interactions. Give your health care provider a list of all the medicines, herbs, non-prescription drugs, or dietary supplements you use. Also tell them if you smoke, drink alcohol, or use illegal drugs. Some items may interact with your medicine. What should I watch for while using this medicine? Visit your doctor or health care professional for regular checks on your progress. This medicine should be used together with a patient support program. It is important to participate in a behavioral program, counseling, or other support program that is recommended by your health care professional. Patients and their families should watch out for new or worsening thoughts of suicide or depression. Also watch out for sudden changes in feelings such as feeling anxious, agitated, panicky, irritable, hostile, aggressive, impulsive, severely restless, overly excited and hyperactive, or not being able to sleep. If this happens, especially at the beginning of treatment or after a change in dose, call your health care professional. Avoid alcoholic drinks while taking this medicine. Drinking excessive alcoholic beverages, using sleeping or anxiety medicines, or quickly stopping the use of these agents while taking this medicine may increase your risk for a seizure. Do not drive or use heavy machinery until you know how this medicine affects you. This medicine can impair your ability to perform these tasks. Do not take this medicine close to bedtime. It may prevent you from sleeping. Your mouth may get dry. Chewing sugarless gum or sucking hard candy, and drinking plenty of water may help. Contact your doctor if the problem does not go away or is severe. Do not use nicotine patches or chewing gum without the advice of your doctor or health care professional while taking this medicine. You may need to have  your blood pressure taken regularly if your doctor recommends that you use both nicotine and this medicine together. What side effects may I  notice from receiving this medicine? Side effects that you should report to your doctor or health care professional as soon as possible: -allergic reactions like skin rash, itching or hives, swelling of the face, lips, or tongue -breathing problems -changes in vision -confusion -fast or irregular heartbeat -hallucinations -increased blood pressure -redness, blistering, peeling or loosening of the skin, including inside the mouth -seizures -suicidal thoughts or other mood changes -unusually weak or tired -vomiting Side effects that usually do not require medical attention (report to your doctor or health care professional if they continue or are bothersome): -change in sex drive or performance -constipation -headache -loss of appetite -nausea -tremors -weight loss This list may not describe all possible side effects. Call your doctor for medical advice about side effects. You may report side effects to FDA at 1-800-FDA-1088. Where should I keep my medicine? Keep out of the reach of children. Store at room temperature between 20 and 25 degrees C (68 and 77 degrees F). Protect from light. Keep container tightly closed. Throw away any unused medicine after the expiration date. NOTE: This sheet is a summary. It may not cover all possible information. If you have questions about this medicine, talk to your doctor, pharmacist, or health care provider.    2016, Elsevier/Gold Standard. (2013-03-10 10:55:10)

## 2016-05-08 LAB — PAP IG AND HPV HIGH-RISK
HPV, high-risk: NEGATIVE
PAP SMEAR COMMENT: 0

## 2016-05-09 ENCOUNTER — Encounter: Payer: Self-pay | Admitting: Obstetrics and Gynecology

## 2016-05-11 ENCOUNTER — Telehealth: Payer: Self-pay

## 2016-05-11 NOTE — Telephone Encounter (Signed)
-----   Message from Hildred LaserAnika Cherry, MD sent at 05/09/2016  1:08 PM EDT ----- Patient with ASCUS HR HPV neg pap.  Can continue routine screening with pap q 3 years. Please inform.

## 2016-05-11 NOTE — Telephone Encounter (Signed)
Pt informed. Pt information mailed.

## 2016-05-21 ENCOUNTER — Ambulatory Visit (INDEPENDENT_AMBULATORY_CARE_PROVIDER_SITE_OTHER): Payer: BLUE CROSS/BLUE SHIELD | Admitting: Family Medicine

## 2016-05-21 ENCOUNTER — Encounter: Payer: Self-pay | Admitting: Family Medicine

## 2016-05-21 VITALS — BP 124/69 | HR 67 | Temp 98.9°F | Wt 144.0 lb

## 2016-05-21 DIAGNOSIS — Z113 Encounter for screening for infections with a predominantly sexual mode of transmission: Secondary | ICD-10-CM

## 2016-05-21 DIAGNOSIS — N898 Other specified noninflammatory disorders of vagina: Secondary | ICD-10-CM

## 2016-05-21 LAB — UA/M W/RFLX CULTURE, ROUTINE
Bilirubin, UA: NEGATIVE
GLUCOSE, UA: NEGATIVE
KETONES UA: NEGATIVE
LEUKOCYTES UA: NEGATIVE
Nitrite, UA: NEGATIVE
Protein, UA: NEGATIVE
RBC, UA: NEGATIVE
Specific Gravity, UA: 1.02 (ref 1.005–1.030)
UUROB: 0.2 mg/dL (ref 0.2–1.0)
pH, UA: 5.5 (ref 5.0–7.5)

## 2016-05-21 LAB — WET PREP FOR TRICH, YEAST, CLUE
CLUE CELL EXAM: NEGATIVE
Trichomonas Exam: NEGATIVE
YEAST EXAM: NEGATIVE

## 2016-05-21 NOTE — Patient Instructions (Signed)
Follow up as needed

## 2016-05-21 NOTE — Progress Notes (Signed)
BP 124/69   Pulse 67   Temp 98.9 F (37.2 C)   Wt 144 lb (65.3 kg)   LMP 05/13/2016 (Exact Date)   SpO2 98%   BMI 23.24 kg/m    Subjective:    Patient ID: Janet Coleman, female    DOB: [birth year], 29 y.o.   MRN: [id number]  HPI: Janet Coleman is a 30 y.o. female  Chief Complaint  Patient presents with  . STD Testing    no concerns, just would like to be tested.    Patient presents today wanting screening labs done to test for STIs. States she usually gets them annually at her GYN appt but they were not done this year at her recent visit. Has been in a monogamous relationship for 5 years with no known STI exposures, but likes to check anyway. Has been on several abx lately and states that since has had dyspareunia, slight vaginal discharge, and some vaginal odor. Usually has these sxs with yeast or BV infections so would like to be checked for that as well. Has not tried any OTC remedies. Denies itching, dysuria, hematuria.   Relevant past medical, surgical, family and social history reviewed and updated as indicated. Interim medical history since our last visit reviewed. Allergies and medications reviewed and updated.  Review of Systems  Constitutional: Negative.   HENT: Negative.   Eyes: Negative.   Respiratory: Negative.   Cardiovascular: Negative.   Gastrointestinal: Negative.   Genitourinary: Positive for dyspareunia and vaginal discharge.  Musculoskeletal: Negative.   Skin: Negative.   Neurological: Negative.   Psychiatric/Behavioral: Negative.     Per HPI unless specifically indicated above     Objective:    BP 124/69   Pulse 67   Temp 98.9 F (37.2 C)   Wt 144 lb (65.3 kg)   LMP 05/13/2016 (Exact Date)   SpO2 98%   BMI 23.24 kg/m   Wt Readings from Last 3 Encounters:  05/21/16 144 lb (65.3 kg)  05/05/16 145 lb 11.2 oz (66.1 kg)  04/28/16 145 lb (65.8 kg)    Physical Exam  Constitutional: She is oriented to person, place, and time. She  appears well-developed and well-nourished. No distress.  HENT:  Head: Atraumatic.  Eyes: Conjunctivae are normal. No scleral icterus.  Neck: Normal range of motion. Neck supple.  Cardiovascular: Normal rate, regular rhythm and normal heart sounds.   Pulmonary/Chest: Effort normal. No respiratory distress.  Abdominal: Soft. Bowel sounds are normal. There is no tenderness.  Genitourinary: Vaginal discharge (minimal white, thin discharge present) found.  Genitourinary Comments: Normal vaginal mucosa, cervix benign  Musculoskeletal: Normal range of motion.  Lymphadenopathy:    She has no cervical adenopathy.  Neurological: She is alert and oriented to person, place, and time.  Skin: Skin is warm and dry.  Psychiatric: She has a normal mood and affect. Her behavior is normal.  Nursing note and vitals reviewed.     Assessment & Plan:   Problem List Items Addressed This Visit    None    Visit Diagnoses    Screening for STD (sexually transmitted disease)    -  Primary   Await lab results   Relevant Orders   GC/Chlamydia Probe Amp   HIV antibody   RPR   WET PREP FOR TRICH, YEAST, CLUE   UA/M w/rflx Culture, Routine   Vaginal discharge       Wet prep negative, discussed good vaginal hygeine measures and use of hyaluronic acid for vaginal  moisturization. Follow up if persistent or changing sxs   Relevant Orders   GC/Chlamydia Probe Amp   HIV antibody   RPR   WET PREP FOR TRICH, YEAST, CLUE   UA/M w/rflx Culture, Routine       Follow up plan: Return if symptoms worsen or fail to improve.

## 2016-05-22 LAB — RPR: RPR Ser Ql: NONREACTIVE

## 2016-05-22 LAB — HIV ANTIBODY (ROUTINE TESTING W REFLEX): HIV SCREEN 4TH GENERATION: NONREACTIVE

## 2016-05-25 ENCOUNTER — Telehealth: Payer: Self-pay | Admitting: Family Medicine

## 2016-05-25 LAB — GC/CHLAMYDIA PROBE AMP
CHLAMYDIA, DNA PROBE: NEGATIVE
Neisseria gonorrhoeae by PCR: NEGATIVE

## 2016-05-25 NOTE — Telephone Encounter (Signed)
Patient notified

## 2016-05-25 NOTE — Telephone Encounter (Signed)
Please call patient and let her know that all of her labs came back normal

## 2016-06-25 ENCOUNTER — Encounter (INDEPENDENT_AMBULATORY_CARE_PROVIDER_SITE_OTHER): Payer: Self-pay

## 2016-06-25 ENCOUNTER — Other Ambulatory Visit: Payer: Self-pay

## 2016-06-25 ENCOUNTER — Ambulatory Visit (INDEPENDENT_AMBULATORY_CARE_PROVIDER_SITE_OTHER): Payer: BLUE CROSS/BLUE SHIELD | Admitting: Family Medicine

## 2016-06-25 ENCOUNTER — Encounter: Payer: Self-pay | Admitting: Family Medicine

## 2016-06-25 VITALS — BP 118/76 | HR 85 | Temp 98.5°F | Wt 149.0 lb

## 2016-06-25 DIAGNOSIS — B9689 Other specified bacterial agents as the cause of diseases classified elsewhere: Secondary | ICD-10-CM

## 2016-06-25 DIAGNOSIS — N760 Acute vaginitis: Secondary | ICD-10-CM

## 2016-06-25 DIAGNOSIS — H02401 Unspecified ptosis of right eyelid: Secondary | ICD-10-CM | POA: Diagnosis not present

## 2016-06-25 LAB — WET PREP FOR TRICH, YEAST, CLUE
CLUE CELL EXAM: POSITIVE — AB
TRICHOMONAS EXAM: NEGATIVE
YEAST EXAM: NEGATIVE

## 2016-06-25 MED ORDER — METRONIDAZOLE 500 MG PO TABS: 500.0000 mg | ORAL_TABLET | Freq: Two times a day (BID) | ORAL | 0 refills | Status: DC

## 2016-06-25 NOTE — Progress Notes (Signed)
   BP 118/76   Pulse 85   Temp 98.5 F (36.9 C)   Wt 149 lb (67.6 kg)   LMP 06/10/2016 (Exact Date)   SpO2 97%   BMI 24.05 kg/m    Subjective:    Patient ID: Janet Coleman, female    DOB: Dec 18, 1985, 29 y.o.   MRN: [id number]  HPI: Janet Coleman is a 30 y.o. female  Chief Complaint  Patient presents with  . Vaginal Discharge    odor and discharge x a few days.    Patient presents with about a week of copious thin gray discharge and vaginal odor. Denies itching or irritation. Has not tried taking anything OTC. Denies fever, chills, STD exposures, or urinary sxs.   Relevant past medical, surgical, family and social history reviewed and updated as indicated. Interim medical history since our last visit reviewed. Allergies and medications reviewed and updated.  Review of Systems  Constitutional: Negative.   HENT: Negative.   Eyes: Negative.   Respiratory: Negative.   Cardiovascular: Negative.   Gastrointestinal: Negative.   Genitourinary: Positive for vaginal discharge.  Musculoskeletal: Negative.   Skin: Negative.   Neurological: Negative.   Psychiatric/Behavioral: Negative.     Per HPI unless specifically indicated above     Objective:    BP 118/76   Pulse 85   Temp 98.5 F (36.9 C)   Wt 149 lb (67.6 kg)   LMP 06/10/2016 (Exact Date)   SpO2 97%   BMI 24.05 kg/m   Wt Readings from Last 3 Encounters:  06/25/16 149 lb (67.6 kg)  05/21/16 144 lb (65.3 kg)  05/05/16 145 lb 11.2 oz (66.1 kg)    Physical Exam  Constitutional: She is oriented to person, place, and time. She appears well-developed and well-nourished. No distress.  HENT:  Head: Atraumatic.  Eyes: Conjunctivae are normal. No scleral icterus.  Neck: Normal range of motion. Neck supple.  Cardiovascular: Normal rate.   Pulmonary/Chest: Effort normal. No respiratory distress.  Genitourinary: Vaginal discharge (copious thin grey discharge present) found.  Genitourinary Comments: Vaginal odor  present No redness or irritation of vaginal mucosa  Musculoskeletal: Normal range of motion.  Neurological: She is alert and oriented to person, place, and time.  Skin: Skin is warm and dry.  Psychiatric: She has a normal mood and affect. Her behavior is normal.  Nursing note and vitals reviewed.     Assessment & Plan:   Problem List Items Addressed This Visit    None    Visit Diagnoses    Bacterial vaginosis    -  Primary   + Wet Prep. Will treat with flagyl. Recommended starting daily probiotic regimen and discussed good feminine hygiene including ph balanced soaps   Relevant Medications   metroNIDAZOLE (FLAGYL) 500 MG tablet   Other Relevant Orders   WET PREP FOR TRICH, YEAST, CLUE       Follow up plan: Return if symptoms worsen or fail to improve.

## 2016-06-25 NOTE — Patient Instructions (Signed)
Follow up as needed

## 2016-10-08 DIAGNOSIS — H02401 Unspecified ptosis of right eyelid: Secondary | ICD-10-CM | POA: Diagnosis not present

## 2016-11-09 DIAGNOSIS — M9902 Segmental and somatic dysfunction of thoracic region: Secondary | ICD-10-CM | POA: Diagnosis not present

## 2016-11-09 DIAGNOSIS — M9903 Segmental and somatic dysfunction of lumbar region: Secondary | ICD-10-CM | POA: Diagnosis not present

## 2016-11-09 DIAGNOSIS — M531 Cervicobrachial syndrome: Secondary | ICD-10-CM | POA: Diagnosis not present

## 2016-12-08 DIAGNOSIS — M5386 Other specified dorsopathies, lumbar region: Secondary | ICD-10-CM | POA: Diagnosis not present

## 2016-12-08 DIAGNOSIS — M9902 Segmental and somatic dysfunction of thoracic region: Secondary | ICD-10-CM | POA: Diagnosis not present

## 2016-12-08 DIAGNOSIS — M531 Cervicobrachial syndrome: Secondary | ICD-10-CM | POA: Diagnosis not present

## 2017-01-01 ENCOUNTER — Encounter: Payer: Self-pay | Admitting: Family Medicine

## 2017-01-01 ENCOUNTER — Ambulatory Visit (INDEPENDENT_AMBULATORY_CARE_PROVIDER_SITE_OTHER): Payer: BLUE CROSS/BLUE SHIELD | Admitting: Family Medicine

## 2017-01-01 VITALS — BP 130/83 | HR 64 | Temp 98.5°F | Wt 153.0 lb

## 2017-01-01 DIAGNOSIS — N898 Other specified noninflammatory disorders of vagina: Secondary | ICD-10-CM | POA: Diagnosis not present

## 2017-01-01 DIAGNOSIS — B9689 Other specified bacterial agents as the cause of diseases classified elsewhere: Secondary | ICD-10-CM

## 2017-01-01 DIAGNOSIS — N760 Acute vaginitis: Secondary | ICD-10-CM

## 2017-01-01 LAB — WET PREP FOR TRICH, YEAST, CLUE
CLUE CELL EXAM: POSITIVE — AB
TRICHOMONAS EXAM: NEGATIVE
YEAST EXAM: NEGATIVE

## 2017-01-01 MED ORDER — METRONIDAZOLE 500 MG PO TABS: 500.0000 mg | ORAL_TABLET | Freq: Two times a day (BID) | ORAL | 0 refills | Status: DC

## 2017-01-01 NOTE — Progress Notes (Signed)
   BP 130/83   Pulse 64   Temp 98.5 F (36.9 C)   Wt 153 lb (69.4 kg)   LMP 12/02/2016 (Approximate)   SpO2 98%   BMI 24.69 kg/m    Subjective:    Patient ID: Janet Coleman, female    DOB: Sep 12, 1985, 31 y.o.   MRN: [id number]  HPI: Janet Coleman is a 31 y.o. female  Chief Complaint  Patient presents with  . Vaginitis    x 3 days. some discharge, odor. No itching.    Patient with 3 day history of vaginal discharge and odor. Denies itching, burning, dysuria, fever, chills, back pain. Hx of frequent BV infections. No concern for STIs, declines testing.   Relevant past medical, surgical, family and social history reviewed and updated as indicated. Interim medical history since our last visit reviewed. Allergies and medications reviewed and updated.  Review of Systems  Constitutional: Negative.   Respiratory: Negative.   Cardiovascular: Negative.   Gastrointestinal: Negative.   Genitourinary: Positive for vaginal discharge.       Vaginal odor  Musculoskeletal: Negative.   Neurological: Negative.   Psychiatric/Behavioral: Negative.     Per HPI unless specifically indicated above     Objective:    BP 130/83   Pulse 64   Temp 98.5 F (36.9 C)   Wt 153 lb (69.4 kg)   LMP 12/02/2016 (Approximate)   SpO2 98%   BMI 24.69 kg/m   Wt Readings from Last 3 Encounters:  01/01/17 153 lb (69.4 kg)  06/25/16 149 lb (67.6 kg)  05/21/16 144 lb (65.3 kg)    Physical Exam  Constitutional: She is oriented to person, place, and time. She appears well-developed and well-nourished.  HENT:  Head: Atraumatic.  Eyes: Conjunctivae are normal. Pupils are equal, round, and reactive to light.  Neck: Normal range of motion. Neck supple.  Cardiovascular: Normal rate and normal heart sounds.   Pulmonary/Chest: Effort normal and breath sounds normal. No respiratory distress.  Genitourinary: Vaginal discharge (Thick white vaginal discharge present) found.  Genitourinary Comments: No  lesions, mucosa benign appearing  Musculoskeletal: Normal range of motion.  Neurological: She is alert and oriented to person, place, and time.  Skin: Skin is warm and dry.  Psychiatric: She has a normal mood and affect. Her behavior is normal.  Nursing note and vitals reviewed.   Results for orders placed or performed in visit on 06/25/16  WET PREP FOR TRICH, YEAST, CLUE  Result Value Ref Range   Trichomonas Exam Negative Negative   Yeast Exam Negative Negative   Clue Cell Exam Positive (A) Negative      Assessment & Plan:   Problem List Items Addressed This Visit    None    Visit Diagnoses    BV (bacterial vaginosis)    -  Primary   Wet prep + for clue cells. Flagyl sent. Discussed hygiene factors to help with prevention. Probiotic daily.    Relevant Medications   metroNIDAZOLE (FLAGYL) 500 MG tablet   Other Relevant Orders   WET PREP FOR TRICH, YEAST, CLUE       Follow up plan: Return if symptoms worsen or fail to improve.

## 2017-01-17 ENCOUNTER — Encounter: Payer: Self-pay | Admitting: Emergency Medicine

## 2017-01-17 ENCOUNTER — Emergency Department
Admission: EM | Admit: 2017-01-17 | Discharge: 2017-01-17 | Disposition: A | Discharge status: Home / Self Care | Payer: BLUE CROSS/BLUE SHIELD | Attending: Emergency Medicine | Admitting: Emergency Medicine

## 2017-01-17 ENCOUNTER — Emergency Department: Payer: BLUE CROSS/BLUE SHIELD

## 2017-01-17 DIAGNOSIS — I10 Essential (primary) hypertension: Secondary | ICD-10-CM | POA: Diagnosis not present

## 2017-01-17 DIAGNOSIS — E039 Hypothyroidism, unspecified: Secondary | ICD-10-CM | POA: Insufficient documentation

## 2017-01-17 DIAGNOSIS — R102 Pelvic and perineal pain: Secondary | ICD-10-CM | POA: Insufficient documentation

## 2017-01-17 DIAGNOSIS — F17210 Nicotine dependence, cigarettes, uncomplicated: Secondary | ICD-10-CM | POA: Diagnosis not present

## 2017-01-17 DIAGNOSIS — R109 Unspecified abdominal pain: Secondary | ICD-10-CM

## 2017-01-17 DIAGNOSIS — R1032 Left lower quadrant pain: Secondary | ICD-10-CM | POA: Diagnosis not present

## 2017-01-17 DIAGNOSIS — Z79899 Other long term (current) drug therapy: Secondary | ICD-10-CM | POA: Diagnosis not present

## 2017-01-17 LAB — URINALYSIS, COMPLETE (UACMP) WITH MICROSCOPIC
Bilirubin Urine: NEGATIVE
Glucose, UA: NEGATIVE mg/dL
Hgb urine dipstick: NEGATIVE
Ketones, ur: NEGATIVE mg/dL
Leukocytes, UA: NEGATIVE
Nitrite: NEGATIVE
PROTEIN: NEGATIVE mg/dL
SPECIFIC GRAVITY, URINE: 1.004 — AB (ref 1.005–1.030)
pH: 6 (ref 5.0–8.0)

## 2017-01-17 LAB — COMPREHENSIVE METABOLIC PANEL
ALBUMIN: 4.2 g/dL (ref 3.5–5.0)
ALK PHOS: 72 U/L (ref 38–126)
ALT: 15 U/L (ref 14–54)
ANION GAP: 5 (ref 5–15)
AST: 17 U/L (ref 15–41)
BUN: 8 mg/dL (ref 6–20)
CALCIUM: 8.9 mg/dL (ref 8.9–10.3)
CHLORIDE: 108 mmol/L (ref 101–111)
CO2: 24 mmol/L (ref 22–32)
Creatinine, Ser: 0.73 mg/dL (ref 0.44–1.00)
GFR calc Af Amer: >60 mL/min (ref >60)
GFR calc non Af Amer: >60 mL/min (ref >60)
GLUCOSE: 99 mg/dL (ref 65–99)
Potassium: 3.6 mmol/L (ref 3.5–5.1)
SODIUM: 137 mmol/L (ref 135–145)
Total Bilirubin: 1 mg/dL (ref 0.3–1.2)
Total Protein: 7.2 g/dL (ref 6.5–8.1)

## 2017-01-17 LAB — CBC
HEMATOCRIT: 40.8 % (ref 35.0–47.0)
HEMOGLOBIN: 14.6 g/dL (ref 12.0–16.0)
MCH: 32.5 pg (ref 26.0–34.0)
MCHC: 35.7 g/dL (ref 32.0–36.0)
MCV: 91 fL (ref 80.0–100.0)
Platelets: 179 10*3/uL (ref 150–440)
RBC: 4.49 MIL/uL (ref 3.80–5.20)
RDW: 13 % (ref 11.5–14.5)
WBC: 10.8 10*3/uL (ref 3.6–11.0)

## 2017-01-17 LAB — LIPASE, BLOOD: LIPASE: 25 U/L (ref 11–51)

## 2017-01-17 LAB — POCT PREGNANCY, URINE: Preg Test, Ur: NEGATIVE

## 2017-01-17 MED ORDER — IOPAMIDOL (ISOVUE-300) INJECTION 61%
30.0000 mL | Freq: Once | INTRAVENOUS | Status: AC | PRN
  Administered 2017-01-17: 30 mL via ORAL

## 2017-01-17 MED ORDER — IBUPROFEN 600 MG PO TABS
600.0000 mg | ORAL_TABLET | Freq: Once | ORAL | Status: DC
  Filled 2017-01-17: qty 1

## 2017-01-17 MED ORDER — OXYCODONE-ACETAMINOPHEN 5-325 MG PO TABS
ORAL_TABLET | ORAL | Status: AC
  Administered 2017-01-17: 1
  Filled 2017-01-17: qty 1

## 2017-01-17 MED ORDER — ONDANSETRON 4 MG PO TBDP
4.0000 mg | ORAL_TABLET | Freq: Once | ORAL | Status: DC
  Filled 2017-01-17: qty 1

## 2017-01-17 MED ORDER — MORPHINE SULFATE (PF) 4 MG/ML IV SOLN
4.0000 mg | Freq: Once | INTRAVENOUS | Status: DC
Start: 2017-01-17 — End: 2017-01-17

## 2017-01-17 MED ORDER — IOPAMIDOL (ISOVUE-300) INJECTION 61%
100.0000 mL | Freq: Once | INTRAVENOUS | Status: AC | PRN
  Administered 2017-01-17: 100 mL via INTRAVENOUS

## 2017-01-17 NOTE — ED Provider Notes (Signed)
[facility] Emergency Department Provider Note ____________________________________________   I have reviewed the triage vital signs and the triage nursing note.  HISTORY  Chief Complaint Abdominal Pain   Historian Patient  HPI Janet Coleman is a 31 y.o. female presents for several days of lower pelvic pain bilaterally maybe a little bit more in the left lower quadrant pain across the superpubic area as well. Denies dysuria or back pain. States this feels similar to when she's had a ruptured ovarian cyst in the past. States that she's had problems with when she was younger and then after her children she didn't have it too much, but it seems to be coming back with every cycle.  Denies constipation or diarrhea. Mild nausea due to pain. No vomiting. No fevers or trouble breathing.  No black or bloody stool.  Symptoms are moderate to severe.    Past Medical History:  Diagnosis Date  . GERD (gastroesophageal reflux disease)   . Hypertension   . Hypothyroidism   . 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  . Migraines   . Reactive hypoglycemia   . Reactive hypoglycemia   . Tobacco use   . Vaginal Pap smear, abnormal     Patient Active Problem List   Diagnosis Date Noted  . Reactive hypoglycemia 02/21/2015  . Elevated cortisol level (HCC) 02/21/2015  . Weight gain, abnormal 02/08/2015  . Hypoglycemia 02/08/2015  . GERD (gastroesophageal reflux disease)   . Hypertension   . Hypothyroidism   . Tobacco use   . Migraines     Past Surgical History:  Procedure Laterality Date  . BELPHAROPTOSIS REPAIR     eye lid lift  . DILATION AND CURETTAGE OF UTERUS     x 2  . LEEP     x 2. last paps 2011, 2012, 2013 were normal    Prior to Admission medications   Medication Sig Start Date End Date Taking? Authorizing Provider  fluticasone (FLONASE) 50 MCG/ACT nasal spray Place 2 sprays into both nostrils daily.  04/28/16   Particia Nearing, PA-C  imiquimod Mathis Dad) 5 % cream  04/13/16   [provider]  metroNIDAZOLE (FLAGYL) 500 MG tablet Take 1 tablet (500 mg total) by mouth 2 (two) times daily. 01/01/17   Particia Nearing, PA-C  SUMAtriptan (IMITREX) 50 MG tablet Take 50 mg by mouth every 2 (two) hours as needed for migraine. May repeat in 2 hours if headache persists or recurs.    [provider]    Allergies  Allergen Reactions  . Ketorolac Swelling  . Toradol [Ketorolac Tromethamine]     Family History  Problem Relation Age of Onset  . Hypertension Mother   . Autoimmune disease Mother   . Stroke Father   . Hypertension Father   . Heart attack Father   . Aneurysm Father        brain  . Heart disease Father   . Cancer Maternal Aunt        breast  . Cancer Maternal Grandmother        breast  . Diabetes Maternal Grandmother   . Stroke Maternal Grandfather   . Diabetes Paternal Grandmother   . Heart disease Paternal Grandmother     Social History Social History  Substance Use Topics  . Smoking status: Current Every Day Smoker    Packs/day: 0.50    Years: 17.00    Types: Cigarettes  . Smokeless tobacco: Never Used  .  Alcohol use No    Review of Systems  Constitutional: Negative for fever. Eyes: Negative for visual changes. ENT: Negative for sore throat. Cardiovascular: Negative for chest pain. Respiratory: Negative for shortness of breath. Gastrointestinal: Negative for  vomiting and diarrhea. Genitourinary: Negative for dysuria. Musculoskeletal: Negative for back pain. Skin: Negative for rash. Neurological: Negative for headache.  ____________________________________________   PHYSICAL EXAM:  VITAL SIGNS: ED Triage Vitals  Enc Vitals Group     BP 01/17/17 0834 133/80     Pulse Rate 01/17/17 0834 95     Resp 01/17/17 0834 16     Temp 01/17/17 0834 98.5 F (36.9 C)     Temp Source 01/17/17 0834 Oral     SpO2 01/17/17 0834 98 %      Weight 01/17/17 0842 153 lb (69.4 kg)     Height --      Head Circumference --      Peak Flow --      Pain Score 01/17/17 0841 8     Pain Loc --      Pain Edu? --      Excl. in GC? --      Constitutional: Alert and oriented. Well appearing Overall, but somewhat tearful due to pelvic pain. HEENT   Head: Normocephalic and atraumatic.      Eyes: Conjunctivae are normal. Pupils equal and round.       Ears:         Nose: No congestion/rhinnorhea.   Mouth/Throat: Mucous membranes are moist.   Neck: No stridor. Cardiovascular/Chest: Normal rate, regular rhythm.  No murmurs, rubs, or gallops. Respiratory: Normal respiratory effort without tachypnea nor retractions. Breath sounds are clear and equal bilaterally. No wheezes/rales/rhonchi. Gastrointestinal: Soft. No distention, no guarding, no rebound. Moderate tenderness in the lower abdomen no focal McBurney's point tenderness but tender across the suprapubic area more so in the left lower quadrant.  Genitourinary/rectal: Declines pelvic exam. Musculoskeletal: Nontender with normal range of motion in all extremities. No joint effusions.  No lower extremity tenderness.  No edema. Neurologic:  Normal speech and language. No gross or focal neurologic deficits are appreciated. Skin:  Skin is warm, dry and intact. No rash noted. Psychiatric: Mood and affect are normal. Speech and behavior are normal. Patient exhibits appropriate insight and judgment.   ____________________________________________  LABS (pertinent positives/negatives)  Labs Reviewed  URINALYSIS, COMPLETE (UACMP) WITH MICROSCOPIC - Abnormal; Notable for the following:       Result Value   Color, Urine YELLOW (*)    APPearance CLEAR (*)    Specific Gravity, Urine 1.004 (*)    Bacteria, UA FEW (*)    Squamous Epithelial / LPF 0-5 (*)    All other components within normal limits  LIPASE, BLOOD  COMPREHENSIVE METABOLIC PANEL  CBC  POC URINE PREG, ED  POCT  PREGNANCY, URINE    ____________________________________________    EKG I, Governor Rooks, MD, the attending physician have personally viewed and interpreted all ECGs.  None ____________________________________________  RADIOLOGY All Xrays were viewed by me. Imaging interpreted by Radiologist.  Ultrasound pelvic and transvaginal with Doppler:  IMPRESSION: Normal pelvic ultrasound. Normal perfusion to both ovaries. __________________________________________  PROCEDURES  Procedure(s) performed: None  Critical Care performed: None  ____________________________________________   ED COURSE / ASSESSMENT AND PLAN  Pertinent labs & imaging results that were available during my care of the patient were reviewed by me and considered in my medical decision making (see chart for details).  Ms. Sautter is here for  lower abdominal cramping which she suspects is an ovarian cyst. This feels similar to prior episodes in the past. Denies GI symptoms. Patient declines pelvic exam, stating that she does not want to do it since she had an exam 2 weeks ago.  Ultrasound is negative for acute finding or ovarian cyst.  I discussed with her whether or not to obtain CT to evaluate other intra-abdominal causes, although less likely initially, this point she still been having severe pain and so we are going to proceed.  Patient care transferred to Dr. Lenard LancePaduchowski at shift change 4 PM. Disposition pending CT scan which is pending at this point.  If no concerning findings, patient may be discharged with I prepared discharge instructions.    CONSULTATIONS:  None   Patient / Family / Caregiver informed of clinical course, medical decision-making process, and agree with plan.   I discussed return precautions, follow-up instructions, and discharge instructions with patient and/or family.  Discharge Instructions : You were evaluated for lower abdominal pain, and although no certain cause was found,  and your exam and evaluation are overall reassuring in the emergency department today.  Return to the emergency department immediately for any worsening abdominal pain, vomiting, black or bloody stool, or any other symptoms concerning to you.  Please follow up with a primary care doctor as well as your OB/GYN doctor.  ___________________________________________   FINAL CLINICAL IMPRESSION(S) / ED DIAGNOSES   Final diagnoses:  Pelvic pain in female              Note: This dictation was prepared with Dragon dictation. Any transcriptional errors that result from this process are unintentional    Governor RooksLord, Obed Samek, MD 01/17/17 1515

## 2017-01-17 NOTE — ED Notes (Addendum)
Pt reports low abdominal pain that is cramping and feels like muscle spasms. Pt has hx of ovarian cysts. Pt also c/o difficulty urinating as well.  Pt states she has hx of kidney stones as well.  Pt c/o nausea.  Pain 8/10 currently. Pt took 800mg  Ibuprofen at 0300.  Pt is alert and oriented x 4.  Pt recently treated for BV about 2 weeks ago. Pt has had protected sex x 1 since being treated for BV.  Denies any vaginal bleeding or discharge.

## 2017-01-17 NOTE — ED Notes (Signed)
Pt stated she needed to walk to the bathroom. Pt never returned. Pt not in restroom. Left without discharge instructions.

## 2017-01-17 NOTE — ED Triage Notes (Signed)
Pt presents with abd pain started x3 days with diarrhea this am and some nausea.

## 2017-01-17 NOTE — ED Notes (Signed)
Gave patient a blanket. 

## 2017-01-17 NOTE — ED Notes (Signed)
Pt refusing pelvic, morphine, zofran and ibuprofen and states only wants the us and a percocet. Dr made aware and okayd the change.

## 2017-01-17 NOTE — ED Notes (Signed)
Pt informed that the treatment room for evaluation is in secured area, where an officer will use a [employer] detector for security purposes. Pt verbalized understanding.

## 2017-01-17 NOTE — ED Notes (Signed)
Patient finished contrast and CT was notified.

## 2017-01-17 NOTE — ED Provider Notes (Signed)
-----------------------------------------   4:43 PM on 01/17/2017 -----------------------------------------  Patient CT scan is negative. Patient will be discharged home with primary care follow-up. Discussed results with the patient is agreeable to plan.   Minna AntisPaduchowski, Yannick Steuber, MD 01/17/17 [phone]

## 2017-01-17 NOTE — Discharge Instructions (Signed)
You were evaluated for lower abdominal pain, and although no certain cause was found, and your exam and evaluation are overall reassuring in the emergency department today.  Return to the emergency department immediately for any worsening abdominal pain, vomiting, black or bloody stool, or any other symptoms concerning to you.  Please follow up with a primary care doctor as well as your OB/GYN doctor.

## 2017-01-17 NOTE — ED Notes (Signed)
Pt requesting pain medication and a ct of her abd - advised she needs to see the physician for their evaluation.

## 2017-01-27 DIAGNOSIS — M5386 Other specified dorsopathies, lumbar region: Secondary | ICD-10-CM | POA: Diagnosis not present

## 2017-01-27 DIAGNOSIS — M9902 Segmental and somatic dysfunction of thoracic region: Secondary | ICD-10-CM | POA: Diagnosis not present

## 2017-01-27 DIAGNOSIS — M531 Cervicobrachial syndrome: Secondary | ICD-10-CM | POA: Diagnosis not present

## 2017-02-01 DIAGNOSIS — M9902 Segmental and somatic dysfunction of thoracic region: Secondary | ICD-10-CM | POA: Diagnosis not present

## 2017-02-01 DIAGNOSIS — M531 Cervicobrachial syndrome: Secondary | ICD-10-CM | POA: Diagnosis not present

## 2017-02-01 DIAGNOSIS — M436 Torticollis: Secondary | ICD-10-CM | POA: Diagnosis not present

## 2017-03-04 DIAGNOSIS — H02401 Unspecified ptosis of right eyelid: Secondary | ICD-10-CM | POA: Diagnosis not present

## 2017-03-12 ENCOUNTER — Encounter: Payer: Self-pay | Admitting: Family Medicine

## 2017-03-12 ENCOUNTER — Ambulatory Visit (INDEPENDENT_AMBULATORY_CARE_PROVIDER_SITE_OTHER): Payer: BLUE CROSS/BLUE SHIELD | Admitting: Family Medicine

## 2017-03-12 VITALS — BP 113/70 | HR 82 | Temp 98.7°F | Ht 66.0 in | Wt 154.9 lb

## 2017-03-12 DIAGNOSIS — N760 Acute vaginitis: Secondary | ICD-10-CM | POA: Diagnosis not present

## 2017-03-12 DIAGNOSIS — N898 Other specified noninflammatory disorders of vagina: Secondary | ICD-10-CM | POA: Diagnosis not present

## 2017-03-12 DIAGNOSIS — B9689 Other specified bacterial agents as the cause of diseases classified elsewhere: Secondary | ICD-10-CM

## 2017-03-12 LAB — UA/M W/RFLX CULTURE, ROUTINE
Bilirubin, UA: NEGATIVE
Glucose, UA: NEGATIVE
KETONES UA: NEGATIVE
NITRITE UA: NEGATIVE
PH UA: 7 (ref 5.0–7.5)
Protein, UA: NEGATIVE
RBC, UA: NEGATIVE
SPEC GRAV UA: 1.02 (ref 1.005–1.030)
Urobilinogen, Ur: 0.2 mg/dL (ref 0.2–1.0)

## 2017-03-12 LAB — MICROSCOPIC EXAMINATION: BACTERIA UA: NONE SEEN

## 2017-03-12 LAB — WET PREP FOR TRICH, YEAST, CLUE
CLUE CELL EXAM: POSITIVE — AB
Trichomonas Exam: NEGATIVE
YEAST EXAM: NEGATIVE

## 2017-03-12 MED ORDER — FLUCONAZOLE 150 MG PO TABS: 150.0000 mg | ORAL_TABLET | Freq: Once | ORAL | 1 refills | Status: AC

## 2017-03-12 MED ORDER — METRONIDAZOLE 500 MG PO TABS: 500.0000 mg | ORAL_TABLET | Freq: Two times a day (BID) | ORAL | 0 refills | Status: DC

## 2017-03-12 MED ORDER — CIPROFLOXACIN HCL 250 MG PO TABS: 250.0000 mg | ORAL_TABLET | Freq: Two times a day (BID) | ORAL | 0 refills | Status: DC

## 2017-03-12 NOTE — Patient Instructions (Signed)
Follow up as needed

## 2017-03-12 NOTE — Progress Notes (Signed)
BP 113/70 (BP Location: Left Arm, Patient Position: Sitting, Cuff Size: Normal)   Pulse 82   Temp 98.7 F (37.1 C)   Ht 5\' 6"  (1.676 m)   Wt 154 lb 14.4 oz (70.3 kg)   SpO2 99%   BMI 25.00 kg/m    Subjective:    Patient ID: Janet Coleman, female    DOB: 06/12/1986, 31 y.o.   MRN: [id number]  HPI: Janet Coleman is a 31 y.o. female  Chief Complaint  Patient presents with  . Vaginal Irritation    x's 5 days. Patient noticed severe itching and irritation when patient urinated. Patient stated her surgar levels have been off and she usually gets a yeast infection when they do.  . Vaginal Itching  . Hyperglycemia   Patient presents with 5 days of severe vaginal irritation, itching, and thick discharge. States her BS have been abnormal the past week or so and this usually triggers a yeast infection. Also having significant urinary frequency. Tried monistat OTC with no relief. Denies fever, chills, N/V/D, abdominal pain, hematuria, lesions.   Relevant past medical, surgical, family and social history reviewed and updated as indicated. Interim medical history since our last visit reviewed. Allergies and medications reviewed and updated.  Review of Systems  Constitutional: Negative.   Respiratory: Negative.   Cardiovascular: Negative.   Gastrointestinal: Negative.   Genitourinary: Positive for frequency, vaginal discharge and vaginal pain.  Musculoskeletal: Negative.   Neurological: Negative.   Psychiatric/Behavioral: Negative.    Per HPI unless specifically indicated above     Objective:    BP 113/70 (BP Location: Left Arm, Patient Position: Sitting, Cuff Size: Normal)   Pulse 82   Temp 98.7 F (37.1 C)   Ht 5\' 6"  (1.676 m)   Wt 154 lb 14.4 oz (70.3 kg)   SpO2 99%   BMI 25.00 kg/m   Wt Readings from Last 3 Encounters:  03/12/17 154 lb 14.4 oz (70.3 kg)  01/17/17 153 lb (69.4 kg)  01/01/17 153 lb (69.4 kg)    Physical Exam  Constitutional: She is oriented to  person, place, and time. She appears well-developed and well-nourished. No distress.  HENT:  Head: Atraumatic.  Eyes: Pupils are equal, round, and reactive to light. Conjunctivae are normal.  Neck: Normal range of motion. Neck supple.  Cardiovascular: Normal rate, regular rhythm and normal heart sounds.   Pulmonary/Chest: Effort normal. No respiratory distress.  Genitourinary: Vaginal discharge found.  Genitourinary Comments: Vaginal mucosa mildly erythematous without lesions  Musculoskeletal: Normal range of motion.  Neurological: She is alert and oriented to person, place, and time.  Skin: Skin is warm and dry.  Psychiatric: She has a normal mood and affect. Her behavior is normal.  Nursing note and vitals reviewed.     Assessment & Plan:   Problem List Items Addressed This Visit    None    Visit Diagnoses    BV (bacterial vaginosis)    -  Primary   Relevant Medications   metroNIDAZOLE (FLAGYL) 500 MG tablet   fluconazole (DIFLUCAN) 150 MG tablet   Other Relevant Orders   UA/M w/rflx Culture, Routine   WET PREP FOR TRICH, YEAST, CLUE    Wet prep + for BV, will treat with flagyl. Sxs very consistent with vaginal candidiasis so will also send diflucan to cover for that. U/A with trace leuks, will send cipro to have on hand in case urinary sxs worsen or don't improve after flagyl. Start probiotics, push fluids, vaginal  hygiene discussed.    Follow up plan: Return if symptoms worsen or fail to improve.

## 2017-03-25 DIAGNOSIS — H02401 Unspecified ptosis of right eyelid: Secondary | ICD-10-CM | POA: Diagnosis not present

## 2017-04-06 DIAGNOSIS — M531 Cervicobrachial syndrome: Secondary | ICD-10-CM | POA: Diagnosis not present

## 2017-04-06 DIAGNOSIS — M9902 Segmental and somatic dysfunction of thoracic region: Secondary | ICD-10-CM | POA: Diagnosis not present

## 2017-04-06 DIAGNOSIS — M9901 Segmental and somatic dysfunction of cervical region: Secondary | ICD-10-CM | POA: Diagnosis not present

## 2017-05-06 ENCOUNTER — Ambulatory Visit (INDEPENDENT_AMBULATORY_CARE_PROVIDER_SITE_OTHER): Payer: BLUE CROSS/BLUE SHIELD | Admitting: Obstetrics and Gynecology

## 2017-05-06 ENCOUNTER — Encounter: Payer: BLUE CROSS/BLUE SHIELD | Admitting: Obstetrics and Gynecology

## 2017-05-06 ENCOUNTER — Encounter: Payer: Self-pay | Admitting: Obstetrics and Gynecology

## 2017-05-06 VITALS — BP 127/84 | HR 81 | Ht 66.0 in | Wt 157.2 lb

## 2017-05-06 DIAGNOSIS — A630 Anogenital (venereal) warts: Secondary | ICD-10-CM

## 2017-05-06 DIAGNOSIS — Z8249 Family history of ischemic heart disease and other diseases of the circulatory system: Secondary | ICD-10-CM

## 2017-05-06 DIAGNOSIS — Z13220 Encounter for screening for lipoid disorders: Secondary | ICD-10-CM | POA: Diagnosis not present

## 2017-05-06 DIAGNOSIS — Z23 Encounter for immunization: Secondary | ICD-10-CM | POA: Diagnosis not present

## 2017-05-06 DIAGNOSIS — E039 Hypothyroidism, unspecified: Secondary | ICD-10-CM

## 2017-05-06 DIAGNOSIS — Z01419 Encounter for gynecological examination (general) (routine) without abnormal findings: Secondary | ICD-10-CM

## 2017-05-06 DIAGNOSIS — Z8619 Personal history of other infectious and parasitic diseases: Secondary | ICD-10-CM

## 2017-05-06 LAB — POCT URINALYSIS DIPSTICK
Bilirubin, UA: NEGATIVE
GLUCOSE UA: NEGATIVE
Ketones, UA: NEGATIVE
LEUKOCYTES UA: NEGATIVE
NITRITE UA: NEGATIVE
Protein, UA: NEGATIVE
RBC UA: NEGATIVE
Spec Grav, UA: 1.02 (ref 1.010–1.025)
UROBILINOGEN UA: 0.2 U/dL
pH, UA: 7 (ref 5.0–8.0)

## 2017-05-06 MED ORDER — VALACYCLOVIR HCL 1 G PO TABS: 1000.0000 mg | ORAL_TABLET | Freq: Every day | ORAL | 2 refills | Status: DC

## 2017-05-06 NOTE — Progress Notes (Signed)
GYNECOLOGY ANNUAL PHYSICAL EXAM PROGRESS NOTE  Subjective:    Janet Coleman is a 31 y.o. P32 married female who presents for an annual exam.  The patient is sexually active.  The patient wears seatbelts: yes. The patient participates in regular exercise: yes. Has the patient ever been transfused or tattooed?: no. The patient reports that there is not domestic violence in her life.   The patient has the following complaints today:  1. Recurrent UTI's?  Patient reports a history of recurrent UTI's in the past.  Notes most recent UTI was diagnosed ~ 1 month ago with her PCP, however she was asymptomatic at that time (had bacteriuria without UTI symptoms). Desires to have urine checked today to make sure infection is gone.  Is experiencing urinary frequency x 1 day. 2. Has a h/o vaginal warts, notes that they are becoming a little bigger and more noticeable.  Desires freezing treatment as she has been on Aldara treatment for some time and is not noticing much improvement.  3. Desires refill for HSV treatment.  Notes that she has not had an outbreak in a year, however she is afraid she will not have medication available if she does end up having one.     Gynecologic History Patient's last menstrual period was 04/22/2017. Menarche age: 62 Contraception: condoms (however notes inconsistent use) History of STI's: HSV, diagnosed 2014.  Last Pap: 02/2015. 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  Notes h/o abnormal pap smears in the past, requiring treatment with cryotherapy and LEEP.     Obstetric History   G3   P1   T1   P0   A2   L0    SAB2   TAB0   Ectopic0   Multiple0   Live Births1     # Outcome Date GA Lbr Len/2nd Weight Sex Delivery Anes PTL Lv  3 SAB           2 SAB           1 Term               Past Medical History:  Diagnosis Date  . GERD (gastroesophageal reflux disease)   . Hypertension   . Hypothyroidism   . 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  . Migraines   . Reactive hypoglycemia   . Reactive hypoglycemia   . Tobacco use   . Vaginal Pap smear, abnormal     Past Surgical History:  Procedure Laterality Date  . BELPHAROPTOSIS REPAIR     eye lid lift  . DILATION AND CURETTAGE OF UTERUS     x 2  . LEEP     x 2. last paps 2011, 2012, 2013 were normal    Family History  Problem Relation Age of Onset  . Hypertension Mother   . Autoimmune disease Mother   . Stroke Father   . Hypertension Father   . Heart attack Father   . Aneurysm Father        brain  . Heart disease Father   . Cancer Maternal Aunt        breast  . Cancer Maternal Grandmother        breast  . Diabetes Maternal Grandmother   . Stroke Maternal Grandfather   . Diabetes Paternal Grandmother   . Heart disease Paternal Grandmother     Social History   Social History  . Marital status: Single    Spouse name: N/A  .  Number of children: N/A  . Years of education: N/A   Occupational History  . Not on file.   Social History Main Topics  . Smoking status: Current Every Day Smoker    Packs/day: 0.50    Years: 17.00    Types: Cigarettes  . Smokeless tobacco: Never Used  . Alcohol use No  . Drug use: No  . Sexual activity: Yes    Birth control/ protection: None, Condom     Comment: Not consistently    Other Topics Concern  . Not on file   Social History Narrative  . No narrative on file    Current Outpatient Prescriptions on File Prior to Visit  Medication Sig Dispense Refill  . fluticasone (FLONASE) 50 MCG/ACT nasal spray Place 2 sprays into both nostrils daily. (Patient taking differently: Place 2 sprays into both nostrils as needed. ) 16 g 6  . SUMAtriptan (IMITREX) 50 MG tablet Take 50 mg by mouth every 2 (two) hours as needed for migraine. May repeat in 2 hours if headache persists or recurs.    . imiquimod (ALDARA) 5 % cream   1   No current facility-administered medications on file prior to visit.      Allergies  Allergen Reactions  . Ketorolac Swelling  . Toradol [Ketorolac Tromethamine]      Review of Systems Constitutional: negative for chills, fatigue, fevers and sweats Eyes: negative for irritation, redness and visual disturbance Ears, nose, mouth, throat, and face: negative for hearing loss, nasal congestion, snoring and tinnitus Respiratory: negative for asthma, cough, sputum Cardiovascular: negative for chest pain, dyspnea, exertional chest pressure/discomfort, irregular heart beat, palpitations and syncope Gastrointestinal: negative for abdominal pain, change in bowel habits, nausea and vomiting Genitourinary:   Negative for abnormal menstrual periods, genital lesions, sexual problems, and urinary incontinence.  Positive for urinary frequency x 1 day Integument/breast: negative for breast lump, breast tenderness and nipple discharge Hematologic/lymphatic: negative for bleeding and easy bruising Musculoskeletal:negative for back pain and muscle weakness Neurological: negative for dizziness, headaches, vertigo and weakness Endocrine: negative for diabetic symptoms including polydipsia, polyuria and skin dryness Allergic/Immunologic: negative for hay fever and urticaria      Objective:  Blood pressure 127/84, pulse 81, height  (1.676 m), weight 157 lb 3.2 oz (71.3 kg), last menstrual period 04/22/2017. Body mass index is 25.37 kg/m.   General Appearance:    Alert, cooperative, no distress, appears stated age  Head:    Normocephalic, without obvious abnormality, atraumatic  Eyes:    PERRL, conjunctiva/corneas clear, EOM's intact, both eyes  Ears:    Normal external ear canals, both ears  Nose:   Nares normal, septum midline, mucosa normal, no drainage or sinus tenderness  Throat:   Lips, mucosa, and tongue normal; teeth and gums normal  Neck:   Supple, symmetrical, trachea midline, no adenopathy; thyroid: no enlargement/tenderness/nodules; no carotid bruit or JVD   Back:     Symmetric, no curvature, ROM normal, no CVA tenderness  Lungs:     Clear to auscultation bilaterally, respirations unlabored  Chest Wall:    No tenderness or deformity   Heart:    Regular rate and rhythm, S1 and S2 normal, no murmur, rub or gallop  Breast Exam:    No tenderness, masses, or nipple abnormality  Abdomen:     Soft, non-tender, bowel sounds active all four quadrants, no masses, no organomegaly.    Genitalia:    Pelvic:external genitalia normal with the exception of several small genital warts bilaterally  on labia majora and near perineal region.  Vagina without lesions, or tenderness.   Moderate amount of thin white vaginal discharge noted, no odor.  Rectovaginal septum  normal. Cervix normal in appearance, no cervical motion tenderness, no adnexal masses or tenderness.  Uterus normal size, shape, mobile, regular contours, nontender.  Rectal:    Normal external sphincter.  No hemorrhoids appreciated. Internal exam not done.   Extremities:   Extremities normal, atraumatic, no cyanosis or edema  Pulses:   2+ and symmetric all extremities  Skin:   Skin color, texture, turgor normal, no rashes or lesions  Lymph nodes:   Cervical, supraclavicular, and axillary nodes normal  Neurologic:   CNII-XII intact, normal strength, sensation and reflexes throughout    Labs:  Lab Results  Component Value Date   WBC 10.8 01/17/2017   HGB 14.6 01/17/2017   HCT 40.8 01/17/2017   MCV 91.0 01/17/2017   PLT 179 01/17/2017    Lab Results  Component Value Date   CREATININE 0.73 01/17/2017   BUN 8 01/17/2017   NA 137 01/17/2017   K 3.6 01/17/2017   CL 108 01/17/2017   CO2 24 01/17/2017    Lab Results  Component Value Date   ALT 15 01/17/2017   AST 17 01/17/2017   ALKPHOS 72 01/17/2017   BILITOT 1.0 01/17/2017    Lab Results  Component Value Date   TSH 4.730 (H) 04/08/2016      Assessment:   Routine gynecologic exam.  H/o recurrent UTI's. Hypothyroidism, mild,  currently on no meds (appears subacute) H/o abnormal pap smears Tobacco abuse Genital warts H/o HSV  Plan:    - Blood tests: TSH and lipid panel (due to patient's family history of heart disease). - Breast self exam technique reviewed and patient encouraged to perform self-exam monthly. - Contraception: condoms, but inconsistent.  Patient continues to decline other forms of contraception at this time. -Discussed healthy lifestyle modifications. - Pap smear up to date, last pap ASCUS with neg HPV.  Patient does have a h/o abnormal pap smears in the past, with h/o LEEP. Continued to encourage routine screening.   - H/o UTI, noting urinary frequency today.  UA negative today.  Encouraged adequate hydration and can use OTC Azo/cranberry juice as needed .  - Tobacco abuse, previously discussed options for cessation, prescribed Zyban last visit, patient notes that she didn't feel it was working so discontinued.  Has not considered cessation again at this point.  - Hypothyroidism, on no meds (likely subacute thyroiditis), to be managed by PCP.  Will repeat thyroid studies.  - Genital warts present, patient desires management with TCA treatment.  Can return in 1-3 weeks for treatment.  - H/o HSV, desires a prescription for just in case of new infection.  Will prescribe with a refill.  - Flu vaccine given today.  - RTC in 1 year     Hildred Laser, MD [facility]

## 2017-05-06 NOTE — Patient Instructions (Signed)
Health Maintenance, Female Adopting a healthy lifestyle and getting preventive care can go a long way to promote health and wellness. Talk with your health care provider about what schedule of regular examinations is right for you. This is a good chance for you to check in with your provider about disease prevention and staying healthy. In between checkups, there are plenty of things you can do on your own. Experts have done a lot of research about which lifestyle changes and preventive measures are most likely to keep you healthy. Ask your health care provider for more information. Weight and diet Eat a healthy diet  Be sure to include plenty of vegetables, fruits, low-fat dairy products, and lean protein.  Do not eat a lot of foods high in solid fats, added sugars, or salt.  Get regular exercise. This is one of the most important things you can do for your health. ? Most adults should exercise for at least 150 minutes each week. The exercise should increase your heart rate and make you sweat (moderate-intensity exercise). ? Most adults should also do strengthening exercises at least twice a week. This is in addition to the moderate-intensity exercise.  Maintain a healthy weight  Body mass index (BMI) is a measurement that can be used to identify possible weight problems. It estimates body fat based on height and weight. Your health care provider can help determine your BMI and help you achieve or maintain a healthy weight.  For females 20 years of age and older: ? A BMI below 18.5 is considered underweight. ? A BMI of 18.5 to 24.9 is normal. ? A BMI of 25 to 29.9 is considered overweight. ? A BMI of 30 and above is considered obese.  Watch levels of cholesterol and blood lipids  You should start having your blood tested for lipids and cholesterol at 32 years of age, then have this test every 5 years.  You may need to have your cholesterol levels checked more often if: ? Your lipid or  cholesterol levels are high. ? You are older than 32 years of age. ? You are at high risk for heart disease.  Cancer screening Lung Cancer  Lung cancer screening is recommended for adults 55-80 years old who are at high risk for lung cancer because of a history of smoking.  A yearly low-dose CT scan of the lungs is recommended for people who: ? Currently smoke. ? Have quit within the past 15 years. ? Have at least a 30-pack-year history of smoking. A pack year is smoking an average of one pack of cigarettes a day for 1 year.  Yearly screening should continue until it has been 15 years since you quit.  Yearly screening should stop if you develop a health problem that would prevent you from having lung cancer treatment.  Breast Cancer  Practice breast self-awareness. This means understanding how your breasts normally appear and feel.  It also means doing regular breast self-exams. Let your health care provider know about any changes, no matter how small.  If you are in your 20s or 30s, you should have a clinical breast exam (CBE) by a health care provider every 1-3 years as part of a regular health exam.  If you are 40 or older, have a CBE every year. Also consider having a breast X-ray (mammogram) every year.  If you have a family history of breast cancer, talk to your health care provider about genetic screening.  If you are at high risk   for breast cancer, talk to your health care provider about having an MRI and a mammogram every year.  Breast cancer gene (BRCA) assessment is recommended for women who have family members with BRCA-related cancers. BRCA-related cancers include: ? Breast. ? Ovarian. ? Tubal. ? Peritoneal cancers.  Results of the assessment will determine the need for genetic counseling and BRCA1 and BRCA2 testing.  Cervical Cancer Your health care provider may recommend that you be screened regularly for cancer of the pelvic organs (ovaries, uterus, and  vagina). This screening involves a pelvic examination, including checking for microscopic changes to the surface of your cervix (Pap test). You may be encouraged to have this screening done every 3 years, beginning at age 22.  For women ages 56-65, health care providers may recommend pelvic exams and Pap testing every 3 years, or they may recommend the Pap and pelvic exam, combined with testing for human papilloma virus (HPV), every 5 years. Some types of HPV increase your risk of cervical cancer. Testing for HPV may also be done on women of any age with unclear Pap test results.  Other health care providers may not recommend any screening for nonpregnant women who are considered low risk for pelvic cancer and who do not have symptoms. Ask your health care provider if a screening pelvic exam is right for you.  If you have had past treatment for cervical cancer or a condition that could lead to cancer, you need Pap tests and screening for cancer for at least 20 years after your treatment. If Pap tests have been discontinued, your risk factors (such as having a new sexual partner) need to be reassessed to determine if screening should resume. Some women have medical problems that increase the chance of getting cervical cancer. In these cases, your health care provider may recommend more frequent screening and Pap tests.  Colorectal Cancer  This type of cancer can be detected and often prevented.  Routine colorectal cancer screening usually begins at 32 years of age and continues through 32 years of age.  Your health care provider may recommend screening at an earlier age if you have risk factors for colon cancer.  Your health care provider may also recommend using home test kits to check for hidden blood in the stool.  A small camera at the end of a tube can be used to examine your colon directly (sigmoidoscopy or colonoscopy). This is done to check for the earliest forms of colorectal  cancer.  Routine screening usually begins at age 33.  Direct examination of the colon should be repeated every 5-10 years through 32 years of age. However, you may need to be screened more often if early forms of precancerous polyps or small growths are found.  Skin Cancer  Check your skin from head to toe regularly.  Tell your health care provider about any new moles or changes in moles, especially if there is a change in a mole's shape or color.  Also tell your health care provider if you have a mole that is larger than the size of a pencil eraser.  Always use sunscreen. Apply sunscreen liberally and repeatedly throughout the day.  Protect yourself by wearing long sleeves, pants, a wide-brimmed hat, and sunglasses whenever you are outside.  Heart disease, diabetes, and high blood pressure  High blood pressure causes heart disease and increases the risk of stroke. High blood pressure is more likely to develop in: ? People who have blood pressure in the high end of  the normal range (130-139/85-89 mm Hg). ? People who are overweight or obese. ? People who are African American.  If you are 21-29 years of age, have your blood pressure checked every 3-5 years. If you are 3 years of age or older, have your blood pressure checked every year. You should have your blood pressure measured twice-once when you are at a hospital or clinic, and once when you are not at a hospital or clinic. Record the average of the two measurements. To check your blood pressure when you are not at a hospital or clinic, you can use: ? An automated blood pressure machine at a pharmacy. ? A home blood pressure monitor.  If you are between 17 years and 37 years old, ask your health care provider if you should take aspirin to prevent strokes.  Have regular diabetes screenings. This involves taking a blood sample to check your fasting blood sugar level. ? If you are at a normal weight and have a low risk for diabetes,  have this test once every three years after 32 years of age. ? If you are overweight and have a high risk for diabetes, consider being tested at a younger age or more often. Preventing infection Hepatitis B  If you have a higher risk for hepatitis B, you should be screened for this virus. You are considered at high risk for hepatitis B if: ? You were born in a country where hepatitis B is common. Ask your health care provider which countries are considered high risk. ? Your parents were born in a high-risk country, and you have not been immunized against hepatitis B (hepatitis B vaccine). ? You have HIV or AIDS. ? You use needles to inject street drugs. ? You live with someone who has hepatitis B. ? You have had sex with someone who has hepatitis B. ? You get hemodialysis treatment. ? You take certain medicines for conditions, including cancer, organ transplantation, and autoimmune conditions.  Hepatitis C  Blood testing is recommended for: ? Everyone born from [birth year] through 1966. ? Anyone with known risk factors for hepatitis C.  Sexually transmitted infections (STIs)  You should be screened for sexually transmitted infections (STIs) including gonorrhea and chlamydia if: ? You are sexually active and are younger than 32 years of age. ? You are older than 32 years of age and your health care provider tells you that you are at risk for this type of infection. ? Your sexual activity has changed since you were last screened and you are at an increased risk for chlamydia or gonorrhea. Ask your health care provider if you are at risk.  If you do not have HIV, but are at risk, it may be recommended that you take a prescription medicine daily to prevent HIV infection. This is called pre-exposure prophylaxis (PrEP). You are considered at risk if: ? You are sexually active and do not regularly use condoms or know the HIV status of your partner(s). ? You take drugs by injection. ? You are  sexually active with a partner who has HIV.  Talk with your health care provider about whether you are at high risk of being infected with HIV. If you choose to begin PrEP, you should first be tested for HIV. You should then be tested every 3 months for as long as you are taking PrEP. Pregnancy  If you are premenopausal and you may become pregnant, ask your health care provider about preconception counseling.  If you may become  pregnant, take 400 to 800 micrograms (mcg) of folic acid every day.  If you want to prevent pregnancy, talk to your health care provider about birth control (contraception). Osteoporosis and menopause  Osteoporosis is a disease in which the bones lose minerals and strength with aging. This can result in serious bone fractures. Your risk for osteoporosis can be identified using a bone density scan.  If you are 56 years of age or older, or if you are at risk for osteoporosis and fractures, ask your health care provider if you should be screened.  Ask your health care provider whether you should take a calcium or vitamin D supplement to lower your risk for osteoporosis.  Menopause may have certain physical symptoms and risks.  Hormone replacement therapy may reduce some of these symptoms and risks. Talk to your health care provider about whether hormone replacement therapy is right for you. Follow these instructions at home:  Schedule regular health, dental, and eye exams.  Stay current with your immunizations.  Do not use any tobacco products including cigarettes, chewing tobacco, or electronic cigarettes.  If you are pregnant, do not drink alcohol.  If you are breastfeeding, limit how much and how often you drink alcohol.  Limit alcohol intake to no more than 1 drink per day for nonpregnant women. One drink equals 12 ounces of beer, 5 ounces of wine, or 1 ounces of hard liquor.  Do not use street drugs.  Do not share needles.  Ask your health care  provider for help if you need support or information about quitting drugs.  Tell your health care provider if you often feel depressed.  Tell your health care provider if you have ever been abused or do not feel safe at home. This information is not intended to replace advice given to you by your health care provider. Make sure you discuss any questions you have with your health care provider. Document Released: 01/26/2011 Document Revised: 12/19/2015 Document Reviewed: 04/16/2015 Elsevier Interactive Patient Education  2018 [employer].  Genital Warts Genital warts are a common STD (sexually transmitted disease). They may appear as small bumps on the tissues of the genital area or anal area. Sometimes, they can become irritated and cause pain. Genital warts are easily passed to other people through sexual contact. Getting treatment is important because genital warts can lead to other problems. In females, the virus that causes genital warts may increase the risk of cervical cancer. What are the causes? Genital warts are caused by a virus that is called human papillomavirus (HPV). HPV is spread by having unprotected sex with an infected person. It can be spread through vaginal, anal, and oral sex. Many people do not know that they are infected. They may be infected for years without problems. However, even if they do not have problems, they can pass the infection to their sexual partners. What increases the risk? Genital warts are more likely to develop in:  People who have unprotected sex.  People who have multiple sexual partners.  People who become sexually active before they are 32 years of age.  Men who are not circumcised.  Women who have a female sexual partner who is not circumcised.  People who have a weakened body defense system (immune system) due to disease or medicine.  People who smoke.  What are the signs or symptoms? Symptoms of genital warts include:  Small growths  in the genital area or anal area. These warts often grow in clusters.  Itching  and irritation in the genital area or anal area.  Bleeding from the warts.  Painful sexual intercourse.  How is this diagnosed? Genital warts can usually be diagnosed from their appearance on the vagina, vulva, penis, perineum, anus, or rectum. Tests may also be done, such as:  Biopsy. A tissue sample is removed so it can be looked at under a microscope.  Colposcopy. In females, a magnifying tool is used to examine the vagina and cervix. Certain solutions may be used to make the HPV cells change color so they can be seen more easily.  A Pap test in females.  Tests for other STDs.  How is this treated? Treatment for genital warts may include:  Applying prescription medicines to the warts. These may be solutions or creams.  Freezing the warts with liquid nitrogen (cryotherapy).  Burning the warts with: ? Laser treatment. ? An electrified probe (electrocautery).  Injecting a substance (Candida antigen or Trichophyton antigen) into the warts to help the body's immune system to fight off the warts.  Interferon injections.  Surgery to remove the warts.  Follow these instructions at home: Medicines  Apply over-the-counter and prescription medicines only as told by your health care provider.  Do not treat genital warts with medicines that are used for treating hand warts.  Talk with your health care provider about using over-the-counter anti-itch creams. General instructions  Do not touch or scratch the warts.  Do not have sex until your treatment has been completed.  Tell your current and past sexual partners about your condition because they may also need treatment.  Keep all follow-up visits as told by your health care provider. This is important.  After treatment, use condoms during sex to prevent future infections. Other Instructions for Women  Women who have genital warts might need  increased screening for cervical cancer. This type of cancer is slow growing and can be cured if it is found early. Chances of developing cervical cancer are increased with HPV.  If you become pregnant, tell your health care provider that you have had HPV. Your health care provider will monitor you closely during pregnancy to be sure that your baby is safe. How is this prevented? Talk with your health care provider about getting the HPV vaccines. These vaccines prevent some HPV infections and cancers. It is recommended that the vaccine be given to males and females who are 52-69 years of age. It will not work if you already have HPV, and it is not recommended for pregnant women. Contact a health care provider if:  You have redness, swelling, or pain in the area of the treated skin.  You have a fever.  You feel generally ill.  You feel lumps in and around your genital area or anal area.  You have bleeding in your genital area or anal area.  You have pain during sexual intercourse. This information is not intended to replace advice given to you by your health care provider. Make sure you discuss any questions you have with your health care provider. Document Released: 07/10/2000 Document Revised: 12/19/2015 Document Reviewed: 10/08/2014 Elsevier Interactive Patient Education  [employer].

## 2017-05-07 LAB — LIPID PANEL
CHOLESTEROL TOTAL: 182 mg/dL (ref 100–199)
Chol/HDL Ratio: 2.9 ratio (ref 0.0–4.4)
HDL: 63 mg/dL (ref >39)
LDL Calculated: 91 mg/dL (ref 0–99)
TRIGLYCERIDES: 139 mg/dL (ref 0–149)
VLDL CHOLESTEROL CAL: 28 mg/dL (ref 5–40)

## 2017-05-07 LAB — THYROID PANEL WITH TSH
FREE THYROXINE INDEX: 1.9 (ref 1.2–4.9)
T3 Uptake Ratio: 27 % (ref 24–39)
T4, Total: 7.1 ug/dL (ref 4.5–12.0)
TSH: 2.56 u[IU]/mL (ref 0.450–4.500)

## 2017-05-18 DIAGNOSIS — M9903 Segmental and somatic dysfunction of lumbar region: Secondary | ICD-10-CM | POA: Diagnosis not present

## 2017-05-18 DIAGNOSIS — M5386 Other specified dorsopathies, lumbar region: Secondary | ICD-10-CM | POA: Diagnosis not present

## 2017-05-28 ENCOUNTER — Encounter: Payer: BLUE CROSS/BLUE SHIELD | Admitting: Obstetrics and Gynecology

## 2017-06-03 ENCOUNTER — Encounter: Payer: Self-pay | Admitting: Obstetrics and Gynecology

## 2017-06-03 ENCOUNTER — Ambulatory Visit (INDEPENDENT_AMBULATORY_CARE_PROVIDER_SITE_OTHER): Payer: BLUE CROSS/BLUE SHIELD | Admitting: Obstetrics and Gynecology

## 2017-06-03 VITALS — BP 108/75 | HR 91 | Ht 66.0 in | Wt 157.1 lb

## 2017-06-03 DIAGNOSIS — Z30011 Encounter for initial prescription of contraceptive pills: Secondary | ICD-10-CM

## 2017-06-03 DIAGNOSIS — A630 Anogenital (venereal) warts: Secondary | ICD-10-CM

## 2017-06-03 MED ORDER — NORETHIN ACE-ETH ESTRAD-FE 1-20 MG-MCG PO TABS
1.0000 | ORAL_TABLET | Freq: Every day | ORAL | 11 refills | Status: DC
Start: 2017-06-03 — End: 2018-04-06

## 2017-06-03 NOTE — Progress Notes (Signed)
    GYNECOLOGY PROGRESS NOTE  Subjective:    Patient ID: Janet Coleman, female    DOB: 12-Apr-1986, 31 y.o.   MRN: [id number]  HPI  Patient is a 31 y.o. [id number] female who presents for TCA treatment for genital warts. Denies complaints today.   Of note, patient also desires to initiate contraception.  Notes that she last had a Mirena IUD but had problems with it.  After removal, she did not ever resume any other type of birth control as she desired to take a break.  Now desires to resume.  Would like to start OCPs. Also notes that she was told by her last Gynecologist that birth control would help with prevention of ovarian cysts, which patient has a history.   The following portions of the patient's history were reviewed and updated as appropriate: allergies, current medications, past family history, past medical history, past social history, past surgical history and problem list.  Review of Systems Pertinent items noted in HPI and remainder of comprehensive ROS otherwise negative.   Objective:   Blood pressure 108/75, pulse 91, height 5\' 6"  (1.676 m), weight 157 lb 1.6 oz (71.3 kg), last menstrual period 05/11/2017. 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  Also a condylomatous region noted in rectovaginal septum.  Vulva otherwise normal. Internal exam not performed.   Assessment:   Genital warts Contraception management.   Plan:   1. Genital warts treated today with TCA trreatment (see procedure note below). Advised to return in 2-3 weeks for follow up, and possibly repeat treatment if needed.  2. Discussed contraception with patient, currently desires OCPs.  Does not report dysmenorrhea or heavy menses with her cycles. Will prescribe Junel 20 mcg.  To perform Sunday start after her next cycle.     GYNECOLOGY PROCEDURE NOTE Patient identified, informed consent given .    Areas of  typical appearing genital warts noted on left and right labia majora, and in rectovaginal septum.  Surrounding area coated with water based lubricant and TCA applied until warts had white appearance.   Patient tolerated the procedure well.    Post procedure instructions given and patient told to wash area in thirty minutes.   Hildred Laserherry, Ivyana Locey, MD [facility]

## 2017-06-30 ENCOUNTER — Encounter: Payer: Self-pay | Admitting: Obstetrics and Gynecology

## 2017-06-30 ENCOUNTER — Ambulatory Visit (INDEPENDENT_AMBULATORY_CARE_PROVIDER_SITE_OTHER): Payer: BLUE CROSS/BLUE SHIELD | Admitting: Obstetrics and Gynecology

## 2017-06-30 VITALS — BP 111/74 | HR 63 | Ht 66.0 in | Wt 154.6 lb

## 2017-06-30 DIAGNOSIS — A630 Anogenital (venereal) warts: Secondary | ICD-10-CM | POA: Diagnosis not present

## 2017-06-30 NOTE — Progress Notes (Signed)
    GYNECOLOGY PROGRESS NOTE  Subjective:    Patient ID: Janet Coleman, female    DOB: 1986/03/04, 31 y.o.   MRN: [id number]  HPI  Patient is a 31 y.o. [id number] female who presents for f/u of genital warts after TCA therapy (treated ~ 3 weeks ago).  Doing well. Denies complaints.  Also notes that she has started her OCPs and does not note any unusual side effects or abnormal breakthrough bleeding.   The following portions of the patient's history were reviewed and updated as appropriate: allergies, current medications, past family history, past medical history, past social history, past surgical history and problem list.  Review of Systems A comprehensive review of systems was negative.   Objective:   Blood pressure 111/74, pulse 63, height 5\' 6"  (1.676 m), weight 154 lb 9.6 oz (70.1 kg). General appearance: alert and no distress Pelvic: external genitalia normal.  Several darkened areas of prior TCA treatment application, but genital warts removed.  One miniscule flat wart noted on left labia majora, not warranting treatment at this time.     Assessment:   Genital warts  Plan:   S/p successful TCA treatment.  Continue to monitor for future wart growth.  Follow up as needed.    Hildred Laserherry, Cale Decarolis, MD [facility]

## 2017-09-24 ENCOUNTER — Telehealth: Payer: Self-pay | Admitting: Unknown Physician Specialty

## 2017-09-24 NOTE — Telephone Encounter (Signed)
Pt does not have DM

## 2017-09-24 NOTE — Telephone Encounter (Signed)
Copied from CRM [id number]. Topic: Quick Communication - See Telephone Encounter >> Sep 24, 2017 10:54 AM Landry Mellow wrote: CRM for notification. See Telephone encounter for:   09/24/17. Pt need rx for glucose monitor meter.  It is a ge 100.  She says the battery died, and there is no way to change just the batter. The pharmacy that she went to has closed, so she cant call the Please send to [facility]

## 2017-09-24 NOTE — Telephone Encounter (Signed)
Needs new meter and supplies (lancets, test strips) to [facility]

## 2017-10-01 ENCOUNTER — Ambulatory Visit: Payer: BLUE CROSS/BLUE SHIELD | Admitting: Family Medicine

## 2017-10-08 ENCOUNTER — Ambulatory Visit (INDEPENDENT_AMBULATORY_CARE_PROVIDER_SITE_OTHER): Payer: BLUE CROSS/BLUE SHIELD | Admitting: Family Medicine

## 2017-10-08 ENCOUNTER — Encounter: Payer: Self-pay | Admitting: Family Medicine

## 2017-10-08 ENCOUNTER — Ambulatory Visit: Payer: BLUE CROSS/BLUE SHIELD | Admitting: Unknown Physician Specialty

## 2017-10-08 VITALS — BP 121/87 | HR 96 | Temp 98.2°F | Wt 154.1 lb

## 2017-10-08 DIAGNOSIS — N898 Other specified noninflammatory disorders of vagina: Secondary | ICD-10-CM | POA: Diagnosis not present

## 2017-10-08 DIAGNOSIS — E162 Hypoglycemia, unspecified: Secondary | ICD-10-CM

## 2017-10-08 DIAGNOSIS — B9689 Other specified bacterial agents as the cause of diseases classified elsewhere: Secondary | ICD-10-CM | POA: Diagnosis not present

## 2017-10-08 DIAGNOSIS — N760 Acute vaginitis: Secondary | ICD-10-CM | POA: Diagnosis not present

## 2017-10-08 LAB — WET PREP FOR TRICH, YEAST, CLUE
CLUE CELL EXAM: POSITIVE — AB
TRICHOMONAS EXAM: NEGATIVE
Yeast Exam: NEGATIVE

## 2017-10-08 MED ORDER — METRONIDAZOLE 500 MG PO TABS: 500.0000 mg | ORAL_TABLET | Freq: Two times a day (BID) | ORAL | 0 refills | Status: DC

## 2017-10-08 NOTE — Progress Notes (Signed)
   BP 121/87   Pulse 96   Temp 98.2 F (36.8 C) (Oral)   Wt 154 lb 1.6 oz (69.9 kg)   SpO2 98%   BMI 24.87 kg/m    Subjective:    Patient ID: Janet Coleman, female    DOB: [birth year], 32 y.o.   MRN: [id number]  HPI: Eren A Cizek is a 32 y.o. female  Chief Complaint  Patient presents with  . Vaginal Discharge    pt states she has noticied vaginal discharge since Monday   Pt here today with vaginal discharge x 5 days. Thick and white but not clumpy. Mild odor, no itching or irritation. Uses dove sensitive skin only. Long hx of BV infections. Denies fever, chills, abdominal pain, urinary sxs, vaginal lesions or rashes. Has not tried anything OTC.    Has been without a glucose monitor for about 3 months and states she's had multiple episodes where her sugars are dropping and she's feeling awful. Still eating every 2 hours, has been eating more carbs than usual lately which she thinks is contributing.   Relevant past medical, surgical, family and social history reviewed and updated as indicated. Interim medical history since our last visit reviewed. Allergies and medications reviewed and updated.  Review of Systems  Per HPI unless specifically indicated above     Objective:    BP 121/87   Pulse 96   Temp 98.2 F (36.8 C) (Oral)   Wt 154 lb 1.6 oz (69.9 kg)   SpO2 98%   BMI 24.87 kg/m   Wt Readings from Last 3 Encounters:  10/08/17 154 lb 1.6 oz (69.9 kg)  06/30/17 154 lb 9.6 oz (70.1 kg)  06/03/17 157 lb 1.6 oz (71.3 kg)    Physical Exam  Constitutional: She is oriented to person, place, and time. She appears well-developed and well-nourished. No distress.  HENT:  Head: Atraumatic.  Eyes: Conjunctivae are normal. Pupils are equal, round, and reactive to light. No scleral icterus.  Neck: Normal range of motion. Neck supple.  Cardiovascular: Normal rate and normal heart sounds.  Pulmonary/Chest: Effort normal and breath sounds normal. No respiratory distress.    Genitourinary: Vaginal discharge found.  Musculoskeletal: Normal range of motion.  Neurological: She is alert and oriented to person, place, and time.  Skin: Skin is warm and dry.  Psychiatric: She has a normal mood and affect. Her behavior is normal.  Nursing note and vitals reviewed.  Results for orders placed or performed in visit on 10/08/17  WET PREP FOR TRICH, YEAST, CLUE  Result Value Ref Range   Trichomonas Exam Negative Negative   Yeast Exam Negative Negative   Clue Cell Exam Positive (A) Negative      Assessment & Plan:   Problem List Items Addressed This Visit      Endocrine   Hypoglycemia    Order faxed for new monitor and testing supplies. Work on getting back to low GI foods to help keep BS stablized       Other Visit Diagnoses    BV (bacterial vaginosis)    -  Primary   Wet prep + for BV, will tx with flagyl. Discussed probiotics, continued good hygiene practices. F/u if no improvement   Relevant Medications   metroNIDAZOLE (FLAGYL) 500 MG tablet   Other Relevant Orders   WET PREP FOR TRICH, YEAST, CLUE (Completed)       Follow up plan: Return if symptoms worsen or fail to improve.

## 2017-10-11 NOTE — Patient Instructions (Signed)
Follow up as needed

## 2017-10-11 NOTE — Assessment & Plan Note (Signed)
Order faxed for new monitor and testing supplies. Work on getting back to low GI foods to help keep BS stablized

## 2017-11-03 NOTE — Telephone Encounter (Signed)
[id number] [place], [place] - [address]  [address] [place] [id number]  Phone: [phone] Fax: [phone]    She is calling in regards to prescription for her Glucose meter and test strips.  Has not heard anything and neither has [facility]  Pt. Has not been able to check her sugar levels in some time   Please call pt. And let her know

## 2017-11-03 NOTE — Telephone Encounter (Signed)
Called pharmacy to confirm that they received the prescription. They stated that they did not so I called the meter, test strips, and lancets in verbally. Will call patient and let her know.

## 2017-11-03 NOTE — Telephone Encounter (Signed)
Called and left patient a VM (not detailed) letting her know that RX was originally faxed 10/08/17 but apologized that the pharmacy says they did not received it. Let the patient know that I called it in verbally for her.

## 2017-11-03 NOTE — Telephone Encounter (Signed)
[facility] meter, strips and [facility]ancets[facility]. See prescription scanned into patient's chart. [facility] now. Will wait for confirmation and call patient to let her know.

## 2018-04-04 ENCOUNTER — Telehealth: Payer: Self-pay

## 2018-04-04 NOTE — Telephone Encounter (Signed)
Needs appt

## 2018-04-04 NOTE — Telephone Encounter (Signed)
Copied from CRM [id number]. Topic: General - Other >> Apr 04, 2018  4:10 PM Janet Coleman wrote: Reason for CRM: Patient is calling to  see if she is able to come in and do a urine culture,  she thinks she may have a UTI. But her work schedule is not free for a appt. Please advise   Routing to provider. Please advise.

## 2018-04-05 NOTE — Telephone Encounter (Signed)
Spoke with patient advising she needs an appointment. 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 I explained to patient that she hasn't been seen in 6 months, she would need an appointment.  The first available is with [facility] Friday 04/08/2018. Patient stated she couldn't wait that long. Advised patient because of her acute symptoms to try an urgent care. Patient agreed and will call if she needs anything or has any questions.

## 2018-04-06 ENCOUNTER — Ambulatory Visit: Payer: Medicaid Other | Admitting: Obstetrics and Gynecology

## 2018-04-06 ENCOUNTER — Encounter: Payer: Self-pay | Admitting: Obstetrics and Gynecology

## 2018-04-06 VITALS — BP 115/78 | HR 92 | Ht 66.0 in | Wt 163.0 lb

## 2018-04-06 DIAGNOSIS — R399 Unspecified symptoms and signs involving the genitourinary system: Secondary | ICD-10-CM

## 2018-04-06 LAB — POCT URINALYSIS DIPSTICK
BILIRUBIN UA: NEGATIVE
Blood, UA: NEGATIVE
GLUCOSE UA: NEGATIVE
KETONES UA: NEGATIVE
Leukocytes, UA: NEGATIVE
Nitrite, UA: NEGATIVE
Protein, UA: NEGATIVE
Spec Grav, UA: 1.02 (ref 1.010–1.025)
Urobilinogen, UA: 0.2 E.U./dL
pH, UA: 6.5 (ref 5.0–8.0)

## 2018-04-06 MED ORDER — CIPROFLOXACIN HCL 250 MG PO TABS: 250.0000 mg | ORAL_TABLET | Freq: Two times a day (BID) | ORAL | 0 refills | Status: DC

## 2018-04-06 NOTE — Progress Notes (Signed)
    GYNECOLOGY CLINIC PROGRESS NOTE Subjective:    Janet Coleman is a 32 y.o. G24P1021 female who complains of dysuria, frequency, incomplete bladder emptying and urgency for 3 week.  Patient also complains of back pain x 1 day (on Monday). Patient denies fever and vaginal discharge.  Patient does have a history of recurrent UTI.  Patient does not have a history of pyelonephritis.  Reports taking Azo since Monday which initially helped, but is now noting symptoms returning.    The following portions of the patient's history were reviewed and updated as appropriate: allergies, current medications, past family history, past medical history, past social history, past surgical history and problem list.   Review of Systems Pertinent items noted in HPI and remainder of comprehensive ROS otherwise negative.    Objective:    BP 115/78   Pulse 92   Ht 5\' 6"  (1.676 m)   Wt 163 lb (73.9 kg)   LMP 03/24/2018   BMI 26.31 kg/m  General: alert and no distress  Abdomen: soft, nondistended and tenderness mild in suprapubic region   Back: CVA tenderness absent  GU: defer exam   Laboratory:  Urine dipstick shows negative for all components.     Assessment:    Acute cystitis    Plan:    1. Medications: ciprofloxacin.  Discussed that taking Azo can sometimes obscure results but symptomatically patient does seem to have a UTI. Will treat empirically and send culture. Continue Azo as needed.  2. Maintain adequate hydration 3. Follow up if symptoms not improving, and prn.     Hildred Laser, MD [facility]

## 2018-04-06 NOTE — Patient Instructions (Signed)

## 2018-04-06 NOTE — Progress Notes (Signed)
PT is present today due to UTI symptoms pt stated that she noticed the symptoms on Monday morning and started taking AZO that evening. Pt stated that AZO helped a little with some of the symptoms but did not total clear them up.

## 2018-05-25 ENCOUNTER — Ambulatory Visit: Payer: Medicaid Other | Admitting: Family Medicine

## 2018-05-25 ENCOUNTER — Encounter: Payer: Self-pay | Admitting: Family Medicine

## 2018-05-25 VITALS — BP 124/76 | HR 77 | Temp 98.5°F | Wt 162.0 lb

## 2018-05-25 DIAGNOSIS — Z720 Tobacco use: Secondary | ICD-10-CM

## 2018-05-25 DIAGNOSIS — N926 Irregular menstruation, unspecified: Secondary | ICD-10-CM

## 2018-05-25 NOTE — Patient Instructions (Signed)
Follow up with [facility] for initial OB visit

## 2018-05-25 NOTE — Progress Notes (Signed)
   BP 124/76   Pulse 77   Temp 98.5 F (36.9 C) (Oral)   Wt 162 lb (73.5 kg)   LMP 04/13/2018 (Approximate)   SpO2 98%   BMI 26.15 kg/m    Subjective:    Patient ID: Janet Coleman, female    DOB: 26-Dec-1985, 32 y.o.   MRN: [id number]  HPI: Janet Coleman is a 32 y.o. female  Chief Complaint  Patient presents with  . Possible Pregnancy   Here today for pregnancy confirmation. She thinks Sept 15th was first day of last period. Took home pregnancy test 2 days ago which was positive. Denies N/V, fatigue, spotting, cramping. Not currently on any medications - has them for prn use.   Relevant past medical, surgical, family and social history reviewed and updated as indicated. Interim medical history since our last visit reviewed. Allergies and medications reviewed and updated.  Review of Systems  Per HPI unless specifically indicated above     Objective:    BP 124/76   Pulse 77   Temp 98.5 F (36.9 C) (Oral)   Wt 162 lb (73.5 kg)   LMP 04/13/2018 (Approximate)   SpO2 98%   BMI 26.15 kg/m   Wt Readings from Last 3 Encounters:  05/25/18 162 lb (73.5 kg)  04/06/18 163 lb (73.9 kg)  10/08/17 154 lb 1.6 oz (69.9 kg)    Physical Exam  Constitutional: She is oriented to person, place, and time. She appears well-developed and well-nourished. No distress.  HENT:  Head: Atraumatic.  Eyes: Conjunctivae and EOM are normal.  Cardiovascular: Normal rate and regular rhythm.  Pulmonary/Chest: Effort normal and breath sounds normal.  Abdominal: Soft. Bowel sounds are normal.  Musculoskeletal: Normal range of motion.  Neurological: She is alert and oriented to person, place, and time.  Skin: Skin is warm and dry.  Psychiatric: She has a normal mood and affect. Her behavior is normal.  Nursing note and vitals reviewed.   Results for orders placed or performed in visit on 04/06/18  POCT urinalysis dipstick  Result Value Ref Range   Color, UA yellow    Clarity, UA clear      Glucose, UA Negative Negative   Bilirubin, UA neg    Ketones, UA neg    Spec Grav, UA 1.020 1.010 - 1.025   Blood, UA neg    pH, UA 6.5 5.0 - 8.0   Protein, UA Negative Negative   Urobilinogen, UA 0.2 0.2 or 1.0 E.U./dL   Nitrite, UA neg    Leukocytes, UA Negative Negative   Appearance yellow    Odor        Assessment & Plan:   Problem List Items Addressed This Visit      Other   Tobacco use    Counseling provided regarding smoking cessation and risks of smoking and pregnancy. Pt aware and will work on cutting back       Other Visit Diagnoses    Missed period    -  Primary   Urine preg positive today. Counseled pt on healthy diet, prenatal vitamins, drinking plenty of fluids. Schedule with [facility] around 10 weeks gest.   Relevant Orders   Pregnancy, urine       Follow up plan: Return if symptoms worsen or fail to improve.

## 2018-05-25 NOTE — Assessment & Plan Note (Signed)
Counseling provided regarding smoking cessation and risks of smoking and pregnancy. Pt aware and will work on cutting back

## 2018-05-27 ENCOUNTER — Telehealth: Payer: Self-pay | Admitting: Family Medicine

## 2018-05-27 MED ORDER — DOXYLAMINE-PYRIDOXINE 10-10 MG PO TBEC: 1.0000 | DELAYED_RELEASE_TABLET | Freq: Two times a day (BID) | ORAL | 0 refills | Status: DC | PRN

## 2018-05-27 NOTE — Telephone Encounter (Signed)
Generic diclegis sent in for her  Copied from CRM [id number]. Topic: General - Other >> May 27, 2018 10:16 AM Percival Spanish wrote:  Pt said she saw Janet Coleman and she told her if she need something for nausea to let her know, she would like something for nausea     [facility]acy  [facility]

## 2018-05-27 NOTE — Telephone Encounter (Signed)
Patient notified

## 2018-06-16 NOTE — Progress Notes (Signed)
      Janet Coleman presents for NOB nurse intake visit. [facility].  G 5.  P 2022.  LMP unknown.  EDD 01/15/19.  [place]. Pregnancy education material explained and given.  0 cats in the home.  NOB labs ordered. BMI less than 30. TSH/HbgA1c not ordered. Genetic screening discussed. Genetic testing;  Unsure. Pt to discuss genetic testing with provider. PNV encouraged. Pt to follow up with provider in 2 weeks for NOB physical.  FMLA for reviewed and signed. [facility] [employer] reviewed and pt stated that she understood.   BP 120/76   Pulse 72   Ht 5\' 6"  (1.676 m)   Wt 165 lb 3.2 oz (74.9 kg)   LMP 04/10/2018 (LMP Unknown)   Breastfeeding? Unknown   BMI 26.66 kg/m

## 2018-06-17 ENCOUNTER — Ambulatory Visit (INDEPENDENT_AMBULATORY_CARE_PROVIDER_SITE_OTHER): Payer: Self-pay | Admitting: Obstetrics and Gynecology

## 2018-06-17 ENCOUNTER — Other Ambulatory Visit: Payer: Self-pay

## 2018-06-17 VITALS — BP 120/76 | HR 72 | Ht 66.0 in | Wt 165.2 lb

## 2018-06-17 DIAGNOSIS — Z3481 Encounter for supervision of other normal pregnancy, first trimester: Secondary | ICD-10-CM

## 2018-06-17 DIAGNOSIS — Z0283 Encounter for blood-alcohol and blood-drug test: Secondary | ICD-10-CM

## 2018-06-17 DIAGNOSIS — Z113 Encounter for screening for infections with a predominantly sexual mode of transmission: Secondary | ICD-10-CM

## 2018-06-17 MED ORDER — PROMETHAZINE HCL 25 MG PO TABS: 25.0000 mg | ORAL_TABLET | Freq: Four times a day (QID) | ORAL | 1 refills | Status: DC | PRN

## 2018-06-17 NOTE — Patient Instructions (Signed)
WHAT OB PATIENTS CAN EXPECT   Confirmation of pregnancy and ultrasound ordered if medically indicated-[redacted] weeks gestation  New OB (NOB) intake with nurse and New OB (NOB) labs- [redacted] weeks gestation  New OB (NOB) physical examination with provider- 11/[redacted] weeks gestation  Flu vaccine-[redacted] weeks gestation  Anatomy scan-[redacted] weeks gestation  Glucose tolerance test, blood work to test for anemia, T-dap vaccine-[redacted] weeks gestation  Vaginal swabs/cultures-STD/Group B strep-[redacted] weeks gestation  Appointments every 4 weeks until 28 weeks  Every 2 weeks from 28 weeks until 36 weeks  Weekly visits from 36 weeks until delivery  Morning Sickness Morning sickness is when you feel sick to your stomach (nauseous) during pregnancy. You may feel sick to your stomach and throw up (vomit). You may feel sick in the morning, but you can feel this way any time of day. Some women feel very sick to their stomach and cannot stop throwing up (hyperemesis gravidarum). Follow these instructions at home:  Only take medicines as told by your doctor.  Take multivitamins as told by your doctor. Taking multivitamins before getting pregnant can stop or lessen the harshness of morning sickness.  Eat dry toast or unsalted crackers before getting out of bed.  Eat 5 to 6 small meals a day.  Eat dry and bland foods like rice and baked potatoes.  Do not drink liquids with meals. Drink between meals.  Do not eat greasy, fatty, or spicy foods.  Have someone cook for you if the smell of food causes you to feel sick or throw up.  If you feel sick to your stomach after taking prenatal vitamins, take them at night or with a snack.  Eat protein when you need a snack (nuts, yogurt, cheese).  Eat unsweetened gelatins for dessert.  Wear a bracelet used for sea sickness (acupressure wristband).  Go to a doctor that puts thin needles into certain body points (acupuncture) to improve how you feel.  Do not smoke.  Use a  humidifier to keep the air in your house free of odors.  Get lots of fresh air. Contact a doctor if:  You need medicine to feel better.  You feel dizzy or lightheaded.  You are losing weight. Get help right away if:  You feel very sick to your stomach and cannot stop throwing up.  You pass out (faint). This information is not intended to replace advice given to you by your health care provider. Make sure you discuss any questions you have with your health care provider. Document Released: 08/20/2004 Document Revised: 12/19/2015 Document Reviewed: 12/28/2012 Elsevier Interactive Patient Education  2017 [employer]. How a Baby Grows During Pregnancy Pregnancy begins when a female's sperm enters a female's egg (fertilization). This happens in one of the tubes (fallopian tubes) that connect the ovaries to the womb (uterus). The fertilized egg is called an embryo until it reaches 10 weeks. From 10 weeks until birth, it is called a fetus. The fertilized egg moves down the fallopian tube to the uterus. Then it implants into the lining of the uterus and begins to grow. The developing fetus receives oxygen and nutrients through the pregnant woman's bloodstream and the tissues that grow (placenta) to support the fetus. The placenta is the life support system for the fetus. It provides nutrition and removes waste. Learning as much as you can about your pregnancy and how your baby is developing can help you enjoy the experience. It can also make you aware of when there might be a problem  and when to ask questions. How long does a typical pregnancy last? A pregnancy usually lasts 280 days, or about 40 weeks. Pregnancy is divided into three trimesters:  First trimester: 0-13 weeks.  Second trimester: 14-27 weeks.  Third trimester: 28-40 weeks.  The day when your baby is considered ready to be born (full term) is your estimated date of delivery. How does my baby develop month by month? First  month  The fertilized egg attaches to the inside of the uterus.  Some cells will form the placenta. Others will form the fetus.  The arms, legs, brain, spinal cord, lungs, and heart begin to develop.  At the end of the first month, the heart begins to beat.  Second month  The bones, inner ear, eyelids, hands, and feet form.  The genitals develop.  By the end of 8 weeks, all major organs are developing.  Third month  All of the internal organs are forming.  Teeth develop below the gums.  Bones and muscles begin to grow. The spine can flex.  The skin is transparent.  Fingernails and toenails begin to form.  Arms and legs continue to grow longer, and hands and feet develop.  The fetus is about 3 in (7.6 cm) long.  Fourth month  The placenta is completely formed.  The external sex organs, neck, outer ear, eyebrows, eyelids, and fingernails are formed.  The fetus can hear, swallow, and move its arms and legs.  The kidneys begin to produce urine.  The skin is covered with a white waxy coating (vernix) and very fine hair (lanugo).  Fifth month  The fetus moves around more and can be felt for the first time (quickening).  The fetus starts to sleep and wake up and may begin to suck its finger.  The nails grow to the end of the fingers.  The organ in the digestive system that makes bile (gallbladder) functions and helps to digest the nutrients.  If your baby is a girl, eggs are present in her ovaries. If your baby is a boy, testicles start to move down into his scrotum.  Sixth month  The lungs are formed, but the fetus is not yet able to breathe.  The eyes open. The brain continues to develop.  Your baby has fingerprints and toe prints. Your baby's hair grows thicker.  At the end of the second trimester, the fetus is about 9 in (22.9 cm) long.  Seventh month  The fetus kicks and stretches.  The eyes are developed enough to sense changes in light.  The  hands can make a grasping motion.  The fetus responds to sound.  Eighth month  All organs and body systems are fully developed and functioning.  Bones harden and taste buds develop. The fetus may hiccup.  Certain areas of the brain are still developing. The skull remains soft.  Ninth month  The fetus gains about  lb (0.23 kg) each week.  The lungs are fully developed.  Patterns of sleep develop.  The fetus's head typically moves into a head-down position (vertex) in the uterus to prepare for birth. If the buttocks move into a vertex position instead, the baby is breech.  The fetus weighs 6-9 lbs (2.72-4.08 kg) and is 19-20 in (48.26-50.8 cm) long.  What can I do to have a healthy pregnancy and help my baby develop? Eating and Drinking  Eat a healthy diet. ? Talk with your health care provider to make sure that you are getting the  nutrients that you and your baby need. ? Visit www.BuildDNA.es to learn about creating a healthy diet.  Gain a healthy amount of weight during pregnancy as advised by your health care provider. This is usually 25-35 pounds. You may need to: ? Gain more if you were underweight before getting pregnant or if you are pregnant with more than one baby. ? Gain less if you were overweight or obese when you got pregnant.  Medicines and Vitamins  Take prenatal vitamins as directed by your health care provider. These include vitamins such as folic acid, iron, calcium, and vitamin D. They are important for healthy development.  Take medicines only as directed by your health care provider. Read labels and ask a pharmacist or your health care provider whether over-the-counter medicines, supplements, and prescription drugs are safe to take during pregnancy.  Activities  Be physically active as advised by your health care provider. Ask your health care provider to recommend activities that are safe for you to do, such as walking or swimming.  Do not  participate in strenuous or extreme sports.  Lifestyle  Do not drink alcohol.  Do not use any tobacco products, including cigarettes, chewing tobacco, or electronic cigarettes. If you need help quitting, ask your health care provider.  Do not use illegal drugs.  Safety  Avoid exposure to mercury, lead, or other heavy metals. Ask your health care provider about common sources of these heavy metals.  Avoid listeria infection during pregnancy. Follow these precautions: ? Do not eat soft cheeses or deli meats. ? Do not eat hot dogs unless they have been warmed up to the point of steaming, such as in the microwave oven. ? Do not drink unpasteurized milk.  Avoid toxoplasmosis infection during pregnancy. Follow these precautions: ? Do not change your cat's litter box, if you have a cat. Ask someone else to do this for you. ? Wear gardening gloves while working in the yard.  General Instructions  Keep all follow-up visits as directed by your health care provider. This is important. This includes prenatal care and screening tests.  Manage any chronic health conditions. Work closely with your health care provider to keep conditions, such as diabetes, under control.  How do I know if my baby is developing well? At each prenatal visit, your health care provider will do several different tests to check on your health and keep track of your baby's development. These include:  Fundal height. ? Your health care provider will measure your growing belly from top to bottom using a tape measure. ? Your health care provider will also feel your belly to determine your baby's position.  Heartbeat. ? An ultrasound in the first trimester can confirm pregnancy and show a heartbeat, depending on how far along you are. ? Your health care provider will check your baby's heart rate at every prenatal visit. ? As you get closer to your delivery date, you may have regular fetal heart rate monitoring to make  sure that your baby is not in distress.  Second trimester ultrasound. ? This ultrasound checks your baby's development. It also indicates your baby's gender.  What should I do if I have concerns about my baby's development? Always talk with your health care provider about any concerns that you may have. This information is not intended to replace advice given to you by your health care provider. Make sure you discuss any questions you have with your health care provider. Document Released: 12/30/2007 Document Revised: 12/19/2015 Document Reviewed:  12/20/2013 Elsevier Interactive Patient Education  2018 [place] Trimester of Pregnancy The first trimester of pregnancy is from week 1 until the end of week 13 (months 1 through 3). During this time, your baby will begin to develop inside you. At 6-8 weeks, the eyes and face are formed, and the heartbeat can be seen on ultrasound. At the end of 12 weeks, all the baby's organs are formed. Prenatal care is all the medical care you receive before the birth of your baby. Make sure you get good prenatal care and follow all of your doctor's instructions. Follow these instructions at home: Medicines  Take over-the-counter and prescription medicines only as told by your doctor. Some medicines are safe and some medicines are not safe during pregnancy.  Take a prenatal vitamin that contains at least 600 micrograms (mcg) of folic acid.  If you have trouble pooping (constipation), take medicine that will make your stool soft (stool softener) if your doctor approves. Eating and drinking  Eat regular, healthy meals.  Your doctor will tell you the amount of weight gain that is right for you.  Avoid raw meat and uncooked cheese.  If you feel sick to your stomach (nauseous) or throw up (vomit): ? Eat 4 or 5 small meals a day instead of 3 large meals. ? Try eating a few soda crackers. ? Drink liquids between meals instead of during meals.  To  prevent constipation: ? Eat foods that are high in fiber, like fresh fruits and vegetables, whole grains, and beans. ? Drink enough fluids to keep your pee (urine) clear or pale yellow. Activity  Exercise only as told by your doctor. Stop exercising if you have cramps or pain in your lower belly (abdomen) or low back.  Do not exercise if it is too hot, too humid, or if you are in a place of great height (high altitude).  Try to avoid standing for long periods of time. Move your legs often if you must stand in one place for a long time.  Avoid heavy lifting.  Wear low-heeled shoes. Sit and stand up straight.  You can have sex unless your doctor tells you not to. Relieving pain and discomfort  Wear a good support bra if your breasts are sore.  Take warm water baths (sitz baths) to soothe pain or discomfort caused by hemorrhoids. Use hemorrhoid cream if your doctor says it is okay.  Rest with your legs raised if you have leg cramps or low back pain.  If you have puffy, bulging veins (varicose veins) in your legs: ? Wear support hose or compression stockings as told by your doctor. ? Raise (elevate) your feet for 15 minutes, 3-4 times a day. ? Limit salt in your food. Prenatal care  Schedule your prenatal visits by the twelfth week of pregnancy.  Write down your questions. Take them to your prenatal visits.  Keep all your prenatal visits as told by your doctor. This is important. Safety  Wear your seat belt at all times when driving.  Make a list of emergency phone numbers. The list should include numbers for family, friends, the hospital, and police and fire departments. General instructions  Ask your doctor for a referral to a local prenatal class. Begin classes no later than at the start of month 6 of your pregnancy.  Ask for help if you need counseling or if you need help with nutrition. Your doctor can give you advice or tell you where to go for help.  Do not use hot  tubs, steam rooms, or saunas.  Do not douche or use tampons or scented sanitary pads.  Do not cross your legs for long periods of time.  Avoid all herbs and alcohol. Avoid drugs that are not approved by your doctor.  Do not use any tobacco products, including cigarettes, chewing tobacco, and electronic cigarettes. If you need help quitting, ask your doctor. You may get counseling or other support to help you quit.  Avoid cat litter boxes and soil used by cats. These carry germs that can cause birth defects in the baby and can cause a loss of your baby (miscarriage) or stillbirth.  Visit your dentist. At home, brush your teeth with a soft toothbrush. Be gentle when you floss. Contact a doctor if:  You are dizzy.  You have mild cramps or pressure in your lower belly.  You have a nagging pain in your belly area.  You continue to feel sick to your stomach, you throw up, or you have watery poop (diarrhea).  You have a bad smelling fluid coming from your vagina.  You have pain when you pee (urinate).  You have increased puffiness (swelling) in your face, hands, legs, or ankles. Get help right away if:  You have a fever.  You are leaking fluid from your vagina.  You have spotting or bleeding from your vagina.  You have very bad belly cramping or pain.  You gain or lose weight rapidly.  You throw up blood. It may look like coffee grounds.  You are around people who have [place] measles, fifth disease, or chickenpox.  You have a very bad headache.  You have shortness of breath.  You have any kind of trauma, such as from a fall or a car accident. Summary  The first trimester of pregnancy is from week 1 until the end of week 13 (months 1 through 3).  To take care of yourself and your unborn baby, you will need to eat healthy meals, take medicines only if your doctor tells you to do so, and do activities that are safe for you and your baby.  Keep all follow-up visits as told  by your doctor. This is important as your doctor will have to ensure that your baby is healthy and growing well. This information is not intended to replace advice given to you by your health care provider. Make sure you discuss any questions you have with your health care provider. Document Released: 12/30/2007 Document Revised: 07/21/2016 Document Reviewed: 07/21/2016 Elsevier Interactive Patient Education  2017 [place]. Commonly Asked Questions During Pregnancy  Cats: A parasite can be excreted in cat feces.  To avoid exposure you need to have another person empty the little box.  If you must empty the litter box you will need to wear gloves.  Wash your hands after handling your cat.  This parasite can also be found in raw or undercooked meat so this should also be avoided.  Colds, Sore Throats, Flu: Please check your medication sheet to see what you can take for symptoms.  If your symptoms are unrelieved by these medications please call the office.  Dental Work: Most any dental work Investment banker, corporate recommends is permitted.  X-rays should only be taken during the first trimester if absolutely necessary.  Your abdomen should be shielded with a lead apron during all x-rays.  Please notify your provider prior to receiving any x-rays.  Novocaine is fine; gas is not recommended.  If your dentist  requires a note from [place] prior to dental work please call the office and we will provide one for you.  Exercise: Exercise is an important part of staying healthy during your pregnancy.  You may continue most exercises you were accustomed to prior to pregnancy.  Later in your pregnancy you will most likely notice you have difficulty with activities requiring balance like riding a bicycle.  It is important that you listen to your body and avoid activities that put you at a higher risk of falling.  Adequate rest and staying well hydrated are a must!  If you have questions about the safety of specific activities ask  your provider.    Exposure to Children with illness: Try to avoid obvious exposure; report any symptoms to [place] when noted,  If you have chicken pos, red measles or mumps, you should be immune to these diseases.   Please do not take any vaccines while pregnant unless you have checked with your OB provider.  Fetal Movement: After 28 weeks we recommend you do "kick counts" twice daily.  Lie or sit down in a calm quiet environment and count your baby movements "kicks".  You should feel your baby at least 10 times per hour.  If you have not felt 10 kicks within the first hour get up, walk around and have something sweet to eat or drink then repeat for an additional hour.  If count remains less than 10 per hour notify your provider.  Fumigating: Follow your pest control agent's advice as to how long to stay out of your home.  Ventilate the area well before re-entering.  Hemorrhoids:   Most over-the-counter preparations can be used during pregnancy.  Check your medication to see what is safe to use.  It is important to use a stool softener or fiber in your diet and to drink lots of liquids.  If hemorrhoids seem to be getting worse please call the office.   Hot Tubs:  Hot tubs Jacuzzis and saunas are not recommended while pregnant.  These increase your internal body temperature and should be avoided.  Intercourse:  Sexual intercourse is safe during pregnancy as long as you are comfortable, unless otherwise advised by your provider.  Spotting may occur after intercourse; report any bright red bleeding that is heavier than spotting.  Labor:  If you know that you are in labor, please go to the hospital.  If you are unsure, please call the office and let us help you decide what to do.  Lifting, straining, etc:  If your job requires heavy lifting or straining please check with your provider for any limitations.  Generally, you should not lift items heavier than that you can lift simply with your hands and arms (no  back muscles)  Painting:  Paint fumes do not harm your pregnancy, but may make you ill and should be avoided if possible.  Latex or water based paints have less odor than oils.  Use adequate ventilation while painting.  Permanents & Hair Color:  Chemicals in hair dyes are not recommended as they cause increase hair dryness which can increase hair loss during pregnancy.  " Highlighting" and permanents are allowed.  Dye may be absorbed differently and permanents may not hold as well during pregnancy.  Sunbathing:  Use a sunscreen, as skin burns easily during pregnancy.  Drink plenty of fluids; avoid over heating.  Tanning Beds:  Because their possible side effects are still unknown, tanning beds are not recommended.  Ultrasound Scans:  Routine ultrasounds are performed at approximately 20 weeks.  You will be able to see your baby's general anatomy an if you would like to know the gender this can usually be determined as well.  If it is questionable when you conceived you may also receive an ultrasound early in your pregnancy for dating purposes.  Otherwise ultrasound exams are not routinely performed unless there is a medical necessity.  Although you can request a scan we ask that you pay for it when conducted because insurance does not cover " patient request" scans.  Work: If your pregnancy proceeds without complications you may work until your due date, unless your physician or employer advises otherwise.  Round Ligament Pain/Pelvic Discomfort:  Sharp, shooting pains not associated with bleeding are fairly common, usually occurring in the second trimester of pregnancy.  They tend to be worse when standing up or when you remain standing for long periods of time.  These are the result of pressure of certain pelvic ligaments called "round ligaments".  Rest, Tylenol and heat seem to be the most effective relief.  As the womb and fetus grow, they rise out of the pelvis and the discomfort improves.  Please  notify the office if your pain seems different than that described.  It may represent a more serious condition.  Common Medications Safe in Pregnancy  Acne:      Constipation:  Benzoyl Peroxide     Colace  Clindamycin      Dulcolax Suppository  Topica Erythromycin     Fibercon  Salicylic Acid      Metamucil         Miralax AVOID:        Senakot   Accutane    Cough:  Retin-A       Cough Drops  Tetracycline      Phenergan w/ Codeine if Rx  Minocycline      Robitussin (Plain & DM)  Antibiotics:     Crabs/Lice:  Ceclor       RID  Cephalosporins    AVOID:  E-Mycins      Kwell  Keflex  Macrobid/Macrodantin   Diarrhea:  Penicillin      Kao-Pectate  Zithromax      Imodium AD         PUSH FLUIDS AVOID:       Cipro     Fever:  Tetracycline      Tylenol (Regular or Extra  Minocycline       Strength)  Levaquin      Extra Strength-Do not          Exceed 8 tabs/24 hrs Caffeine:        <217m/day (equiv. To 1 cup of coffee or  approx. 3 12 oz sodas)         Gas: Cold/Hayfever:       Gas-X  Benadryl      Mylicon  Claritin       Phazyme  **Claritin-D        Chlor-Trimeton    Headaches:  Dimetapp      ASA-Free Excedrin  Drixoral-Non-Drowsy     Cold Compress  Mucinex (Guaifenasin)     Tylenol (Regular or Extra  Sudafed/Sudafed-12 Hour     Strength)  **Sudafed PE Pseudoephedrine   Tylenol Cold & Sinus     Vicks Vapor Rub  Zyrtec  **AVOID if Problems With Blood Pressure         Heartburn: Avoid lying down for at least 1 hour  after meals  Aciphex      Maalox     Rash:  Milk of Magnesia     Benadryl    Mylanta       1% Hydrocortisone Cream  Pepcid  Pepcid Complete   Sleep Aids:  Prevacid      Ambien   Prilosec       Benadryl  Rolaids       Chamomile Tea  Tums (Limit 4/day)     Unisom  Zantac       Tylenol PM         Warm milk-add vanilla or  Hemorrhoids:       Sugar for taste  Anusol/Anusol H.C.  (RX: Analapram 2.5%)  Sugar Substitutes:  Hydrocortisone OTC     Ok in  moderation  Preparation H      Tucks        Vaseline lotion applied to tissue with wiping    Herpes:     Throat:  Acyclovir      Oragel  Famvir  Valtrex     Vaccines:         Flu Shot Leg Cramps:       *Gardasil  Benadryl      Hepatitis A         Hepatitis B Nasal Spray:       Pneumovax  Saline Nasal Spray     Polio Booster         Tetanus Nausea:       Tuberculosis test or PPD  Vitamin B6 25 mg TID   AVOID:    Dramamine      *Gardasil  Emetrol       Live Poliovirus  Ginger Root 250 mg QID    MMR (measles, mumps &  High Complex Carbs @ Bedtime    rebella)  Sea Bands-Accupressure    Varicella (Chickenpox)  Unisom 1/2 tab TID     *No known complications           If received before Pain:         Known pregnancy;   Darvocet       Resume series after  Lortab        Delivery  Percocet    Yeast:   Tramadol      Femstat  Tylenol 3      Gyne-lotrimin  Ultram       Monistat  Vicodin           MISC:         All Sunscreens           Hair Coloring/highlights          Insect Repellant's          (Including DEET)         [place]

## 2018-06-18 LAB — VARICELLA ZOSTER ANTIBODY, IGG: Varicella zoster IgG: 1863 index (ref >165)

## 2018-06-18 LAB — HIV ANTIBODY (ROUTINE TESTING W REFLEX): HIV Screen 4th Generation wRfx: NONREACTIVE

## 2018-06-18 LAB — SYPHILIS: RPR W/REFLEX TO RPR TITER AND TREPONEMAL ANTIBODIES, TRADITIONAL SCREENING AND DIAGNOSIS ALGORITHM: RPR Ser Ql: NONREACTIVE

## 2018-06-18 LAB — ABO AND RH: RH TYPE: NEGATIVE

## 2018-06-18 LAB — RUBELLA SCREEN: Rubella Antibodies, IGG: 5.69 {index}

## 2018-06-18 LAB — ANTIBODY SCREEN: Antibody Screen: NEGATIVE

## 2018-06-18 LAB — HEPATITIS B SURFACE ANTIGEN: Hepatitis B Surface Ag: NEGATIVE

## 2018-06-20 ENCOUNTER — Other Ambulatory Visit: Payer: Medicaid Other

## 2018-06-28 ENCOUNTER — Ambulatory Visit (INDEPENDENT_AMBULATORY_CARE_PROVIDER_SITE_OTHER): Payer: Medicaid Other | Admitting: Obstetrics and Gynecology

## 2018-06-28 ENCOUNTER — Ambulatory Visit (INDEPENDENT_AMBULATORY_CARE_PROVIDER_SITE_OTHER): Payer: Medicaid Other

## 2018-06-28 ENCOUNTER — Other Ambulatory Visit: Payer: Self-pay | Admitting: Obstetrics and Gynecology

## 2018-06-28 ENCOUNTER — Other Ambulatory Visit (HOSPITAL_COMMUNITY)
Admission: RE | Admit: 2018-06-28 | Discharge: 2018-06-28 | Disposition: A | Discharge status: Home / Self Care | Payer: Medicaid Other | Source: Ambulatory Visit | Attending: Obstetrics and Gynecology | Admitting: Obstetrics and Gynecology

## 2018-06-28 ENCOUNTER — Encounter: Payer: Self-pay | Admitting: Obstetrics and Gynecology

## 2018-06-28 VITALS — BP 116/65 | HR 85 | Ht 66.0 in | Wt 165.7 lb

## 2018-06-28 DIAGNOSIS — I10 Essential (primary) hypertension: Secondary | ICD-10-CM

## 2018-06-28 DIAGNOSIS — O99331 Smoking (tobacco) complicating pregnancy, first trimester: Secondary | ICD-10-CM | POA: Diagnosis not present

## 2018-06-28 DIAGNOSIS — Z720 Tobacco use: Secondary | ICD-10-CM

## 2018-06-28 DIAGNOSIS — Z3201 Encounter for pregnancy test, result positive: Secondary | ICD-10-CM

## 2018-06-28 DIAGNOSIS — Z3A11 11 weeks gestation of pregnancy: Secondary | ICD-10-CM

## 2018-06-28 DIAGNOSIS — O26899 Other specified pregnancy related conditions, unspecified trimester: Secondary | ICD-10-CM | POA: Insufficient documentation

## 2018-06-28 DIAGNOSIS — Z3687 Encounter for antenatal screening for uncertain dates: Secondary | ICD-10-CM

## 2018-06-28 DIAGNOSIS — O10911 Unspecified pre-existing hypertension complicating pregnancy, first trimester: Secondary | ICD-10-CM

## 2018-06-28 DIAGNOSIS — Z3481 Encounter for supervision of other normal pregnancy, first trimester: Secondary | ICD-10-CM

## 2018-06-28 DIAGNOSIS — Z1379 Encounter for other screening for genetic and chromosomal anomalies: Secondary | ICD-10-CM

## 2018-06-28 DIAGNOSIS — Z8742 Personal history of other diseases of the female genital tract: Secondary | ICD-10-CM

## 2018-06-28 DIAGNOSIS — F17210 Nicotine dependence, cigarettes, uncomplicated: Secondary | ICD-10-CM

## 2018-06-28 DIAGNOSIS — E039 Hypothyroidism, unspecified: Secondary | ICD-10-CM

## 2018-06-28 DIAGNOSIS — O219 Vomiting of pregnancy, unspecified: Secondary | ICD-10-CM | POA: Diagnosis not present

## 2018-06-28 DIAGNOSIS — Z6791 Unspecified blood type, Rh negative: Secondary | ICD-10-CM | POA: Insufficient documentation

## 2018-06-28 DIAGNOSIS — Z3482 Encounter for supervision of other normal pregnancy, second trimester: Secondary | ICD-10-CM | POA: Diagnosis not present

## 2018-06-28 DIAGNOSIS — O26891 Other specified pregnancy related conditions, first trimester: Secondary | ICD-10-CM | POA: Diagnosis not present

## 2018-06-28 LAB — POCT URINALYSIS DIPSTICK OB
Bilirubin, UA: NEGATIVE
Glucose, UA: NEGATIVE
Leukocytes, UA: NEGATIVE
Nitrite, UA: NEGATIVE
POC,PROTEIN,UA: NEGATIVE
RBC UA: NEGATIVE
SPEC GRAV UA: 1.01 (ref 1.010–1.025)
Urobilinogen, UA: 0.2 E.U./dL
pH, UA: 7.5 (ref 5.0–8.0)

## 2018-06-28 NOTE — Progress Notes (Signed)
OBSTETRIC INITIAL PRENATAL VISIT  Subjective:    Janet Coleman is being seen today for her first obstetrical visit.  This is a planned pregnancy. She is a [id number] female at [redacted]w[redacted]d gestation, Estimated Date of Delivery: 01/15/19 with Patient's last menstrual period was 04/10/2018 (exact date) consistent with 11 week sono. Her obstetrical history is significant for Rh negative status, hypertension, tobacco abuse, and hypothyroidism. She also has a history of positive HSV serology, however has never had an outbreak. Has a history of Relationship with FOB: spouse, living together. Patient does intend to breast feed. Pregnancy history fully reviewed.    OB History  Gravida Para Term Preterm AB Living  5 2 2  0 2 2  SAB TAB Ectopic Multiple Live Births  2 0 0 0 2    # Outcome Date GA Lbr Len/2nd Weight Sex Delivery Anes PTL Lv  5 Current           4 Term 2007 [redacted]w[redacted]d  7 lb 6 oz (3.345 kg) F Vag-Spont   LIV  3 Term 2006 [redacted]w[redacted]d  8 lb 4 oz (3.742 kg) F Vag-Spont   LIV  2 SAB           1 SAB             Gynecologic History:  Last pap smear was 04/2016.  Results were abnormal (ASCUS HR HPV neg).  Admits h/o abnormal pap smears in the past (2016, [facility]).  Admits history of STIs: HPV.      Past Medical History:  Diagnosis Date  . GERD (gastroesophageal reflux disease)   . Hypertension   . Hypothyroidism   . 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  . Migraines   . Reactive hypoglycemia   . Reactive hypoglycemia   . Tobacco use   . Vaginal Pap smear, abnormal      Family History  Problem Relation Age of Onset  . Hypertension Mother   . Autoimmune disease Mother   . Stroke Father   . Hypertension Father   . Heart attack Father   . Aneurysm Father        brain  . Heart disease Father   . Cancer Maternal Aunt        breast  . Cancer Maternal Grandmother        breast  . Diabetes Maternal Grandmother   . Stroke Maternal Grandfather     . Diabetes Paternal Grandmother   . Heart disease Paternal Grandmother      Past Surgical History:  Procedure Laterality Date  . BELPHAROPTOSIS REPAIR     eye lid lift  . DILATION AND CURETTAGE OF UTERUS     x 2  . LEEP     x 2. last paps 2011, 2012, 2013 were normal     Social History   Socioeconomic History  . Marital status: Single    Spouse name: Not on file  . Number of children: Not on file  . Years of education: Not on file  . Highest education level: Not on file  Occupational History  . Not on file  Social Needs  . Financial resource strain: Not on file  . Food insecurity:    Worry: Not on file    Inability: Not on file  . Transportation needs:    Medical: Not on file    Non-medical: Not on file  Tobacco Use  . Smoking status: Current Every Day Smoker  Packs/day: 0.50    Years: 17.00    Pack years: 8.50    Types: Cigarettes  . Smokeless tobacco: Never Used  Substance and Sexual Activity  . Alcohol use: Not Currently    Comment: occass  . Drug use: No  . Sexual activity: Yes    Birth control/protection: None    Comment: Not consistently   Lifestyle  . Physical activity:    Days per week: Not on file    Minutes per session: Not on file  . Stress: Not on file  Relationships  . Social connections:    Talks on phone: Not on file    Gets together: Not on file    Attends religious service: Not on file    Active member of club or organization: Not on file    Attends meetings of clubs or organizations: Not on file    Relationship status: Not on file  . Intimate partner violence:    Fear of current or ex partner: Not on file    Emotionally abused: Not on file    Physically abused: Not on file    Forced sexual activity: Not on file  Other Topics Concern  . Not on file  Social History Narrative  . Not on file     Current Outpatient Medications on File Prior to Visit  Medication Sig Dispense Refill  . Prenatal Vit-Fe Fumarate-FA (PRENATAL  MULTIVITAMIN) TABS tablet Take 1 tablet by mouth daily at 12 noon.     No current facility-administered medications on file prior to visit.      Allergies  Allergen Reactions  . Toradol [Ketorolac Tromethamine] Swelling  . Ketorolac Swelling     Review of Systems General:Not Present- Fever, Weight Loss and Weight Gain. Skin:Not Present- Rash. HEENT:Not Present- Blurred Vision, Headache and Bleeding Gums. Respiratory:Not Present- Difficulty Breathing. Breast:Not Present- Breast Mass. Cardiovascular:Not Present- Chest Pain, Elevated Blood Pressure, Fainting / Blacking Out and Shortness of Breath. Gastrointestinal:Not Present- Abdominal Pain, Constipation, Nausea and Vomiting (controlled with Diclegis and Zofran). Female Genitourinary:Not Present- Frequency, Painful Urination, Pelvic Pain, Vaginal Bleeding, Vaginal Discharge, Contractions, regular, Fetal Movements Decreased, Urinary Complaints and Vaginal Fluid. Musculoskeletal:Not Present- Back Pain and Leg Cramps. Neurological:Not Present- Dizziness. Psychiatric:Not Present- Depression.     Objective:   Blood pressure 116/65, pulse 85, weight 165 lb 11.2 oz (75.2 kg), last menstrual period 04/10/2018, unknown if currently breastfeeding.  Body mass index is 26.74 kg/m.   General Appearance:    Alert, cooperative, no distress, appears stated age, overweight  Head:    Normocephalic, without obvious abnormality, atraumatic  Eyes:    PERRL, conjunctiva/corneas clear, EOM's intact, both eyes  Ears:    Normal external ear canals, both ears  Nose:   Nares normal, septum midline, mucosa normal, no drainage or sinus tenderness  Throat:   Lips, mucosa, and tongue normal; teeth and gums normal  Neck:   Supple, symmetrical, trachea midline, no adenopathy; thyroid: no enlargement/tenderness/nodules; no carotid bruit or JVD  Back:     Symmetric, no curvature, ROM normal, no CVA tenderness  Lungs:     Clear to auscultation  bilaterally, respirations unlabored  Chest Wall:    No tenderness or deformity   Heart:    Regular rate and rhythm, S1 and S2 normal, no murmur, rub or gallop  Breast Exam:    No tenderness, masses, or nipple abnormality  Abdomen:     Soft, non-tender, bowel sounds active all four quadrants, no masses, no organomegaly.  FH 11 cm.  FHT 167  bpm.  Genitalia:    Pelvic:external genitalia normal, vagina without lesions, discharge, or tenderness, rectovaginal septum  normal. Cervix normal in appearance, no cervical motion tenderness, no adnexal masses or tenderness.  Pregnancy positive findings: uterine enlargement: 11 wk size, nontender.   Rectal:    Normal external sphincter.  No hemorrhoids appreciated. Internal exam not done.   Extremities:   Extremities normal, atraumatic, no cyanosis or edema  Pulses:   2+ and symmetric all extremities  Skin:   Skin color, texture, turgor normal, no rashes or lesions  Lymph nodes:   Cervical, supraclavicular, and axillary nodes normal  Neurologic:   CNII-XII intact, normal strength, sensation and reflexes throughout       Assessment:    Pregnancy at 11 and 2/7 weeks    Hypertension Tobacco abuse Hypothyroidism Rh negative status Overweight History of abnormal pap smear  Plan:    Initial labs ordered. Recent type and screen notes Rh negative status.  Pap smear performed today.  Prenatal vitamins encouraged. Problem list reviewed and updated. New OB counseling:  The patient has been given an overview regarding routine prenatal care.  Recommendations regarding diet, weight gain, and exercise in pregnancy were given. Prenatal testing, optional genetic testing, and ultrasound use in pregnancy were reviewed.  first trimester, second trimester, and cell-free DNA screening: requests cell-free DNA testing, will order Panorama. Benefits of Breast Feeding were discussed. The patient is encouraged to consider nursing her baby post partum. Nausea and vomiting  controlled with anti-emetics.  Hypertension and hypothyroidism, currently controlled on no meds.  Will order baseline labs. Needs to begin daily baby aspirin for h/o HTN.  Counseled on smoking cessation  Follow up in 4 weeks.   The patient has Medicaid.  [facility] Medicaid Risk Screening Form completed today   50% of 30 min visit spent on counseling and coordination of care.

## 2018-06-28 NOTE — Patient Instructions (Signed)

## 2018-06-28 NOTE — Progress Notes (Signed)
ROB-Pt is present today for NOB PE. Pt stated that she is doing well no complaints.

## 2018-06-29 LAB — NICOTINE SCREEN, URINE: Cotinine Ql Scrn, Ur: POSITIVE ng/mL — AB

## 2018-06-29 LAB — URINALYSIS, ROUTINE W REFLEX MICROSCOPIC
Bilirubin, UA: NEGATIVE
Glucose, UA: NEGATIVE
Leukocytes, UA: NEGATIVE
NITRITE UA: NEGATIVE
Protein, UA: NEGATIVE
RBC, UA: NEGATIVE
Specific Gravity, UA: 1.013 (ref 1.005–1.030)
Urobilinogen, Ur: 0.2 mg/dL (ref 0.2–1.0)
pH, UA: 7 (ref 5.0–7.5)

## 2018-06-29 LAB — HEPATITIS B SURFACE ANTIGEN: Hepatitis B Surface Ag: NEGATIVE

## 2018-06-29 LAB — DRUG PROFILE, UR, 9 DRUGS (LABCORP)
Amphetamines, Urine: NEGATIVE ng/mL
Barbiturate Quant, Ur: NEGATIVE ng/mL
Benzodiazepine Quant, Ur: NEGATIVE ng/mL
Cannabinoid Quant, Ur: NEGATIVE ng/mL
Cocaine (Metab.): NEGATIVE ng/mL
Methadone Screen, Urine: NEGATIVE ng/mL
Opiate Quant, Ur: NEGATIVE ng/mL
PCP Quant, Ur: NEGATIVE ng/mL
Propoxyphene: NEGATIVE ng/mL

## 2018-06-29 LAB — ABO AND RH: Rh Factor: NEGATIVE

## 2018-06-29 LAB — VARICELLA ZOSTER ANTIBODY, IGG: Varicella zoster IgG: 3099 index (ref >165)

## 2018-06-29 LAB — HIV ANTIBODY (ROUTINE TESTING W REFLEX): HIV Screen 4th Generation wRfx: NONREACTIVE

## 2018-06-29 LAB — ANTIBODY SCREEN: Antibody Screen: NEGATIVE

## 2018-06-29 LAB — RUBELLA SCREEN: Rubella Antibodies, IGG: 5.89 index (ref >0.99)

## 2018-06-29 LAB — GC/CHLAMYDIA PROBE AMP
Chlamydia trachomatis, NAA: NEGATIVE
Neisseria gonorrhoeae by PCR: NEGATIVE

## 2018-06-29 LAB — HGB SOLU + RFLX FRAC: SICKLE SOLUBILITY TEST - HGBRFX: NEGATIVE

## 2018-06-29 LAB — RPR: RPR Ser Ql: NONREACTIVE

## 2018-06-30 LAB — URINE CULTURE, OB REFLEX: Organism ID, Bacteria: NO GROWTH

## 2018-06-30 LAB — CULTURE, OB URINE

## 2018-07-04 LAB — CYTOLOGY - PAP
Diagnosis: UNDETERMINED — AB
HPV: NOT DETECTED

## 2018-07-05 ENCOUNTER — Telehealth: Payer: Self-pay | Admitting: Unknown Physician Specialty

## 2018-07-05 NOTE — Telephone Encounter (Signed)
Patient called and asked to clarify the name of the test strips needed, she says she will need Test Strips for the GE 100 Meter.

## 2018-07-05 NOTE — Telephone Encounter (Signed)
Copied from CRM [id number]. Topic: Quick Communication - Rx Refill/Question >> Jul 05, 2018  9:00 AM Angela NevinWilliams, Candice N wrote: Medication: 50 ge 100 glucose blood test strip  Patient is requesting a refill of her test strips. Patient is out.   Preferred Pharmacy (with phone number or street name):[id number] [place], [place] - [address] [phone] (Phone) [phone] (Fax)

## 2018-07-06 NOTE — Telephone Encounter (Signed)
RX signed and faxed to the pharmacy.  

## 2018-07-06 NOTE — Telephone Encounter (Signed)
Order form for test strips filled out. Will get provider signature and fax to the pharmacy.

## 2018-07-07 ENCOUNTER — Telehealth: Payer: Self-pay

## 2018-07-07 NOTE — Telephone Encounter (Signed)
Pt was called and went over genetic testing and pt wanted to know the sex of the baby. Pt seems to be very excited that she was having a girl.

## 2018-07-11 ENCOUNTER — Telehealth: Payer: Self-pay

## 2018-07-11 NOTE — Telephone Encounter (Signed)
Form was filled out to [facility] [place] Accu-Chek AVIVA Plus Lantus Soft Click Strips Lantus Soft Click Needles  Fleet ContrasRachel not in the office today   Copied from CRM [phone]. Topic: Quick Communication - Rx Refill/Question >> Jul 05, 2018  9:00 AM Angela NevinWilliams, Candice N wrote: Medication: 50 ge 100 glucose blood test strip  Patient is requesting a refill of her test strips. Patient is out.   Preferred Pharmacy (with phone number or street name):[id number] [place], [place] - [address] [phone] (Phone) [phone] (Fax) >> Jul 11, 2018  2:07 PM Floria RavelingStovall, Shana A wrote: Pt called in and stated that pharmacy told her that test strips that was called in is not covered.  They told her that a new machine would need to be call in in order for Medicaid to over it.    Accu-Chek AVIVA Plus  Lantas Soft Click needles and strips . [facility] [place]

## 2018-07-12 NOTE — Telephone Encounter (Signed)
Fleet ContrasRachel signed order and [facility] [place].

## 2018-07-25 ENCOUNTER — Encounter: Payer: Self-pay | Admitting: Obstetrics and Gynecology

## 2018-07-25 ENCOUNTER — Ambulatory Visit (INDEPENDENT_AMBULATORY_CARE_PROVIDER_SITE_OTHER): Payer: Medicaid Other | Admitting: Obstetrics and Gynecology

## 2018-07-25 VITALS — BP 112/70 | HR 82 | Wt 162.9 lb

## 2018-07-25 DIAGNOSIS — Z3481 Encounter for supervision of other normal pregnancy, first trimester: Secondary | ICD-10-CM | POA: Diagnosis not present

## 2018-07-25 LAB — POCT URINALYSIS DIPSTICK OB
Bilirubin, UA: NEGATIVE
Blood, UA: NEGATIVE
Glucose, UA: NEGATIVE
Leukocytes, UA: NEGATIVE
Nitrite, UA: NEGATIVE
POC,PROTEIN,UA: NEGATIVE
Spec Grav, UA: 1.01 (ref 1.010–1.025)
Urobilinogen, UA: 0.2 E.U./dL
pH, UA: 6.5 (ref 5.0–8.0)

## 2018-07-25 NOTE — Progress Notes (Signed)
ROB: Patient without complaint.  Taking vitamins as prescribed.  Desires AFP for spina bifida.  FAS next visit.

## 2018-07-27 LAB — AFP, SERUM, OPEN SPINA BIFIDA
AFP MoM: 0.84
AFP Value: 23.2 ng/mL
Gest. Age on Collection Date: 15 weeks
Maternal Age At EDD: 33.4 yr
OSBR Risk 1 IN: 10000
Test Results:: NEGATIVE
Weight: 162 [lb_av]

## 2018-07-27 NOTE — L&D Delivery Note (Signed)
       Delivery Note   Janet Coleman is a 33 y.o. [id number] at [redacted]w[redacted]d Estimated Date of Delivery: 01/15/19  PRE-OPERATIVE DIAGNOSIS:  1) [redacted]w[redacted]d pregnancy.  2) PROM  POST-OPERATIVE DIAGNOSIS:  1) [redacted]w[redacted]d pregnancy s/p Vaginal, Spontaneous  2) viable female infant  Delivery Type: Vaginal, Spontaneous    Delivery Anesthesia: Epidural   Labor Complications:      ESTIMATED BLOOD LOSS: 150  ml    FINDINGS:   1) female infant, Apgar scores of 8   at 1 minute and 9   at 5 minutes and a birthweight of   ounces.    2) Nuchal cord: Body cord  SPECIMENS:   PLACENTA:   Appearance: Intact    Removal: Spontaneous      Disposition:    DISPOSITION:  Infant to left in stable condition in the delivery room, with L&D personnel and mother,  NARRATIVE SUMMARY: Labor course:  [name] is a [id number] at [redacted]w[redacted]d who presented for PROM.  She progressed well in labor with pitocin.  She received the appropriate anesthesia and proceeded to complete dilation. She evidenced good maternal expulsive effort during the second stage. She went on to deliver a viable infant. The placenta delivered without problems and was noted to be complete. A perineal and vaginal examination was performed. Episiotomy/Lacerations: None    Finis Bud, M.D. 01/10/2019 1:31 PM

## 2018-08-22 ENCOUNTER — Other Ambulatory Visit: Payer: Self-pay | Admitting: Obstetrics and Gynecology

## 2018-08-22 DIAGNOSIS — Z3402 Encounter for supervision of normal first pregnancy, second trimester: Secondary | ICD-10-CM

## 2018-08-23 ENCOUNTER — Ambulatory Visit (INDEPENDENT_AMBULATORY_CARE_PROVIDER_SITE_OTHER): Payer: Medicaid Other

## 2018-08-23 ENCOUNTER — Ambulatory Visit (INDEPENDENT_AMBULATORY_CARE_PROVIDER_SITE_OTHER): Payer: Medicaid Other | Admitting: Obstetrics and Gynecology

## 2018-08-23 VITALS — BP 109/73 | HR 103 | Wt 163.5 lb

## 2018-08-23 DIAGNOSIS — Z3482 Encounter for supervision of other normal pregnancy, second trimester: Secondary | ICD-10-CM

## 2018-08-23 DIAGNOSIS — Z8639 Personal history of other endocrine, nutritional and metabolic disease: Secondary | ICD-10-CM

## 2018-08-23 DIAGNOSIS — Z363 Encounter for antenatal screening for malformations: Secondary | ICD-10-CM | POA: Diagnosis not present

## 2018-08-23 DIAGNOSIS — Z3402 Encounter for supervision of normal first pregnancy, second trimester: Secondary | ICD-10-CM

## 2018-08-23 DIAGNOSIS — Z720 Tobacco use: Secondary | ICD-10-CM

## 2018-08-23 LAB — POCT URINALYSIS DIPSTICK OB
Bilirubin, UA: NEGATIVE
Blood, UA: NEGATIVE
Glucose, UA: NEGATIVE
Leukocytes, UA: NEGATIVE
Nitrite, UA: NEGATIVE
POC,PROTEIN,UA: NEGATIVE
Spec Grav, UA: 1.02 (ref 1.010–1.025)
Urobilinogen, UA: 0.2 E.U./dL
pH, UA: 6 (ref 5.0–8.0)

## 2018-08-23 MED ORDER — CITRANATAL BLOOM 90-1 MG PO TABS: 90.0000 mg | ORAL_TABLET | Freq: Every day | ORAL | 11 refills | Status: DC

## 2018-08-23 NOTE — Progress Notes (Signed)
Janet Coleman stated that she is doing well and has no problems or concerns to report at this time.

## 2018-08-23 NOTE — Progress Notes (Signed)
ROB: Doing well, no complaints. Notes concern about doing RGS due to reactive hypoglycemia. Discussed checking blood sugars x 1 week around 28 weeks, which patient is ok with. Continued counseling on tobacco cessation, down to 6 cig/day but feels like she is stuck here. Declines meds to assist with smoking cessation. To continue working on cut-down method. Normal anatomy scan today. Will need to repeat thyroid levels as patient with h/o thyroid disease. RTC in 4 weeks.

## 2018-09-20 ENCOUNTER — Other Ambulatory Visit: Payer: Medicaid Other

## 2018-09-20 ENCOUNTER — Encounter: Payer: Self-pay | Admitting: Obstetrics and Gynecology

## 2018-09-20 ENCOUNTER — Ambulatory Visit (INDEPENDENT_AMBULATORY_CARE_PROVIDER_SITE_OTHER): Payer: Medicaid Other | Admitting: Obstetrics and Gynecology

## 2018-09-20 VITALS — BP 115/71 | HR 80 | Ht 66.0 in | Wt 166.4 lb

## 2018-09-20 DIAGNOSIS — Z3482 Encounter for supervision of other normal pregnancy, second trimester: Secondary | ICD-10-CM

## 2018-09-20 DIAGNOSIS — Z8639 Personal history of other endocrine, nutritional and metabolic disease: Secondary | ICD-10-CM

## 2018-09-20 LAB — POCT URINALYSIS DIPSTICK OB
Bilirubin, UA: NEGATIVE
Blood, UA: NEGATIVE
Glucose, UA: NEGATIVE
KETONES UA: POSITIVE
Leukocytes, UA: NEGATIVE
Nitrite, UA: NEGATIVE
POC,PROTEIN,UA: NEGATIVE
Spec Grav, UA: 1.01 (ref 1.010–1.025)
Urobilinogen, UA: 0.2 E.U./dL
pH, UA: 6.5 (ref 5.0–8.0)

## 2018-09-20 NOTE — Progress Notes (Signed)
ROB: Patient without complaint.  History of hypoglycemia and hypothyroidism.  A1c ordered today instead of 1 hour GCT and thyroid panel ordered today.

## 2018-09-20 NOTE — Progress Notes (Signed)
Patient comes in today for  ROB visit.  

## 2018-09-21 LAB — HEMOGLOBIN A1C
ESTIMATED AVERAGE GLUCOSE: 82 mg/dL
HEMOGLOBIN A1C: 4.5 % — AB (ref 4.8–5.6)

## 2018-09-21 LAB — THYROID PANEL WITH TSH
FREE THYROXINE INDEX: 1.6 (ref 1.2–4.9)
T3 Uptake Ratio: 15 % — ABNORMAL LOW (ref 24–39)
T4, Total: 10.8 ug/dL (ref 4.5–12.0)
TSH: 3.14 u[IU]/mL (ref 0.450–4.500)

## 2018-10-18 ENCOUNTER — Encounter: Payer: Medicaid Other | Admitting: Obstetrics and Gynecology

## 2018-11-09 ENCOUNTER — Encounter: Payer: Self-pay | Admitting: Obstetrics and Gynecology

## 2018-11-09 ENCOUNTER — Other Ambulatory Visit: Payer: Self-pay

## 2018-11-09 ENCOUNTER — Ambulatory Visit (INDEPENDENT_AMBULATORY_CARE_PROVIDER_SITE_OTHER): Payer: Medicaid Other | Admitting: Obstetrics and Gynecology

## 2018-11-09 VITALS — BP 116/70 | HR 83 | Wt 171.8 lb

## 2018-11-09 DIAGNOSIS — Z23 Encounter for immunization: Secondary | ICD-10-CM

## 2018-11-09 DIAGNOSIS — E162 Hypoglycemia, unspecified: Secondary | ICD-10-CM

## 2018-11-09 DIAGNOSIS — E039 Hypothyroidism, unspecified: Secondary | ICD-10-CM

## 2018-11-09 DIAGNOSIS — O99619 Diseases of the digestive system complicating pregnancy, unspecified trimester: Secondary | ICD-10-CM

## 2018-11-09 DIAGNOSIS — Z3483 Encounter for supervision of other normal pregnancy, third trimester: Secondary | ICD-10-CM

## 2018-11-09 DIAGNOSIS — Z6791 Unspecified blood type, Rh negative: Secondary | ICD-10-CM | POA: Diagnosis not present

## 2018-11-09 DIAGNOSIS — O99613 Diseases of the digestive system complicating pregnancy, third trimester: Secondary | ICD-10-CM

## 2018-11-09 DIAGNOSIS — K219 Gastro-esophageal reflux disease without esophagitis: Secondary | ICD-10-CM

## 2018-11-09 DIAGNOSIS — O26893 Other specified pregnancy related conditions, third trimester: Secondary | ICD-10-CM

## 2018-11-09 DIAGNOSIS — O26899 Other specified pregnancy related conditions, unspecified trimester: Secondary | ICD-10-CM

## 2018-11-09 LAB — POCT URINALYSIS DIPSTICK OB
Bilirubin, UA: NEGATIVE
Blood, UA: NEGATIVE
Glucose, UA: NEGATIVE
Ketones, UA: NEGATIVE
Leukocytes, UA: NEGATIVE
Nitrite, UA: NEGATIVE
Spec Grav, UA: 1.02 (ref 1.010–1.025)
Urobilinogen, UA: 0.2 E.U./dL
pH, UA: 7 (ref 5.0–8.0)

## 2018-11-09 MED ORDER — TETANUS-DIPHTH-ACELL PERTUSSIS 5-2.5-18.5 LF-MCG/0.5 IM SUSP
0.5000 mL | Freq: Once | INTRAMUSCULAR | Status: AC
  Administered 2018-11-09: 0.5 mL via INTRAMUSCULAR

## 2018-11-09 MED ORDER — PANTOPRAZOLE SODIUM 40 MG PO TBEC: 40.0000 mg | DELAYED_RELEASE_TABLET | Freq: Every day | ORAL | 1 refills | Status: DC

## 2018-11-09 MED ORDER — RHO D IMMUNE GLOBULIN 1500 UNIT/2ML IJ SOSY
300.0000 ug | PREFILLED_SYRINGE | Freq: Once | INTRAMUSCULAR | Status: AC
  Administered 2018-11-09: 300 ug via INTRAMUSCULAR

## 2018-11-09 NOTE — Progress Notes (Signed)
ROB: Patient reports bad reflux despite use of Prilosec OTC and Tums. Symptoms usually around dinnertime which is her largest meal. Advised on smaller portion at dinner, and will prescribe Protonix.  Thyroid levels and A1c  normal,Desires to bottle feed,  desires unsure method for contraception, given handout on contraception. For Tdap and Rhogam today, signed blood consent. RTC in 4 weeks.

## 2018-11-09 NOTE — Progress Notes (Signed)
ROB-Pt present for prenatal care. Pt stated that she is having really bad acid reflux and has tried several OTC medication that has not helped. Pt stated that she was feeling the baby move, no pain or pressure expect for the pain from acid reflux, pt stated no contractions.

## 2018-12-07 ENCOUNTER — Telehealth: Payer: Self-pay

## 2018-12-07 NOTE — Telephone Encounter (Signed)
Pt called no symptoms do not have a face mask.    Coronavirus (COVID-19) Are you at risk?  Are you at risk for the Coronavirus (COVID-19)?  To be considered HIGH RISK for Coronavirus (COVID-19), you have to meet the following criteria:  . Traveled to [place], [place], [place], [place] or [place]; or in the [place] to [place], [place], [place], or [place]; and have fever, cough, and shortness of breath within the last 2 weeks of travel OR . Been in close contact with a person diagnosed with COVID-19 within the last 2 weeks and have fever, cough, and shortness of breath . IF YOU DO NOT MEET THESE CRITERIA, YOU ARE CONSIDERED LOW RISK FOR COVID-19.  What to do if you are HIGH RISK for COVID-19?  Marland Kitchen If you are having a medical emergency, call 911. . Seek medical care right away. Before you go to a doctor's office, urgent care or emergency department, call ahead and tell them about your recent travel, contact with someone diagnosed with COVID-19, and your symptoms. You should receive instructions from your physician's office regarding next steps of care.  . When you arrive at healthcare provider, tell the healthcare staff immediately you have returned from visiting [place], [place], [place], [place] or [place]; or traveled in the [place] to [place], [place], [place], or [place]; in the last two weeks or you have been in close contact with a person diagnosed with COVID-19 in the last 2 weeks.   . Tell the health care staff about your symptoms: fever, cough and shortness of breath. . After you have been seen by a medical provider, you will be either: o Tested for (COVID-19) and discharged home on quarantine except to seek medical care if symptoms worsen, and asked to  - Stay home and avoid contact with others until you get your results (4-5 days)  - Avoid travel on public transportation if possible (such as bus, train, or airplane) or o Sent to the Emergency Department by EMS  for evaluation, COVID-19 testing, and possible admission depending on your condition and test results.  What to do if you are LOW RISK for COVID-19?  Reduce your risk of any infection by using the same precautions used for avoiding the common cold or flu:  Marland Kitchen Wash your hands often with soap and warm water for at least 20 seconds.  If soap and water are not readily available, use an alcohol-based hand sanitizer with at least 60% alcohol.  . If coughing or sneezing, cover your mouth and nose by coughing or sneezing into the elbow areas of your shirt or coat, into a tissue or into your sleeve (not your hands). . Avoid shaking hands with others and consider head nods or verbal greetings only. . Avoid touching your eyes, nose, or mouth with unwashed hands.  . Avoid close contact with people who are sick. . Avoid places or events with large numbers of people in one location, like concerts or sporting events. . Carefully consider travel plans you have or are making. . If you are planning any travel outside or inside the [place], visit the CDC's Travelers' Health webpage for the latest health notices. . If you have some symptoms but not all symptoms, continue to monitor at home and seek medical attention if your symptoms worsen. . If you are having a medical emergency, call 911.   ADDITIONAL HEALTHCARE OPTIONS FOR PATIENTS  [facility] / e-Visit: [email or web address]  [facility]: [phone]  [facility]: [phone]                   [facility]: [phone]  

## 2018-12-08 ENCOUNTER — Ambulatory Visit (INDEPENDENT_AMBULATORY_CARE_PROVIDER_SITE_OTHER): Payer: Medicaid Other | Admitting: Obstetrics and Gynecology

## 2018-12-08 ENCOUNTER — Other Ambulatory Visit: Payer: Self-pay

## 2018-12-08 ENCOUNTER — Encounter: Payer: Self-pay | Admitting: Obstetrics and Gynecology

## 2018-12-08 VITALS — BP 124/86 | HR 67 | Ht 66.0 in | Wt 172.1 lb

## 2018-12-08 DIAGNOSIS — Z3483 Encounter for supervision of other normal pregnancy, third trimester: Secondary | ICD-10-CM

## 2018-12-08 LAB — POCT URINALYSIS DIPSTICK OB
Bilirubin, UA: NEGATIVE
Blood, UA: NEGATIVE
Glucose, UA: NEGATIVE
Ketones, UA: NEGATIVE
Leukocytes, UA: NEGATIVE
Nitrite, UA: NEGATIVE
POC,PROTEIN,UA: NEGATIVE
Spec Grav, UA: 1.01 (ref 1.010–1.025)
Urobilinogen, UA: 0.2 E.U./dL
pH, UA: 7 (ref 5.0–8.0)

## 2018-12-08 NOTE — Progress Notes (Signed)
ROB: Patient without complaint.  Occasional Braxton Hicks contractions.  GBS next visit.

## 2018-12-08 NOTE — Progress Notes (Signed)
Patient comes in today for ROB visit. She is having some braxton hicks contractions off and on. No other concerns.

## 2018-12-20 ENCOUNTER — Other Ambulatory Visit: Payer: Self-pay

## 2018-12-20 ENCOUNTER — Observation Stay
Admission: EM | Admit: 2018-12-20 | Discharge: 2018-12-20 | Disposition: A | Discharge status: Home / Self Care | Payer: Medicaid Other | Attending: Obstetrics and Gynecology | Admitting: Obstetrics and Gynecology

## 2018-12-20 DIAGNOSIS — O4703 False labor before 37 completed weeks of gestation, third trimester: Secondary | ICD-10-CM

## 2018-12-20 DIAGNOSIS — O99891 Other specified diseases and conditions complicating pregnancy: Secondary | ICD-10-CM

## 2018-12-20 DIAGNOSIS — Z3A36 36 weeks gestation of pregnancy: Secondary | ICD-10-CM | POA: Diagnosis not present

## 2018-12-20 DIAGNOSIS — O26893 Other specified pregnancy related conditions, third trimester: Secondary | ICD-10-CM | POA: Diagnosis present

## 2018-12-20 DIAGNOSIS — M549 Dorsalgia, unspecified: Secondary | ICD-10-CM

## 2018-12-20 DIAGNOSIS — M545 Low back pain: Secondary | ICD-10-CM | POA: Diagnosis not present

## 2018-12-20 LAB — URINALYSIS, ROUTINE W REFLEX MICROSCOPIC
Bilirubin Urine: NEGATIVE
Glucose, UA: NEGATIVE mg/dL
Hgb urine dipstick: NEGATIVE
Ketones, ur: NEGATIVE mg/dL
Leukocytes,Ua: NEGATIVE
Nitrite: NEGATIVE
Protein, ur: NEGATIVE mg/dL
Specific Gravity, Urine: 1.002 — ABNORMAL LOW (ref 1.005–1.030)
pH: 7 (ref 5.0–8.0)

## 2018-12-20 LAB — RUPTURE OF MEMBRANE (ROM)PLUS: Rom Plus: NEGATIVE

## 2018-12-20 MED ORDER — OXYCODONE HCL 5 MG PO TABS
5.0000 mg | ORAL_TABLET | Freq: Once | ORAL | Status: AC
  Administered 2018-12-20: 5 mg via ORAL

## 2018-12-20 MED ORDER — OXYCODONE HCL 5 MG PO TABS
ORAL_TABLET | ORAL | Status: AC
  Filled 2018-12-20: qty 1

## 2018-12-20 MED ORDER — OXYCODONE-ACETAMINOPHEN 5-325 MG PO TABS
ORAL_TABLET | ORAL | Status: AC
  Filled 2018-12-20: qty 1

## 2018-12-20 MED ORDER — ACETAMINOPHEN 500 MG PO TABS
1000.0000 mg | ORAL_TABLET | Freq: Four times a day (QID) | ORAL | Status: DC | PRN
  Administered 2018-12-20: 16:00:00 1000 mg via ORAL
  Filled 2018-12-20: qty 2

## 2018-12-20 NOTE — Discharge Instructions (Signed)
Back Pain in Pregnancy  Back pain during pregnancy is common. Back pain may be caused by several factors that are related to changes during your pregnancy.  Follow these instructions at home:  Managing pain, stiffness, and swelling          If directed, for sudden (acute) back pain, put ice on the painful area.  ? Put ice in a plastic bag.  ? Place a towel between your skin and the bag.  ? Leave the ice on for 20 minutes, 2-3 times per day.   If directed, apply heat to the affected area before you exercise. Use the heat source that your health care provider recommends, such as a moist heat pack or a heating pad.  ? Place a towel between your skin and the heat source.  ? Leave the heat on for 20-30 minutes.  ? Remove the heat if your skin turns bright red. This is especially important if you are unable to feel pain, heat, or cold. You may have a greater risk of getting burned.   If directed, massage the affected area.  Activity   Exercise as told by your health care provider. Gentle exercise is the best way to prevent or manage back pain.   Listen to your body when lifting. If lifting hurts, ask for help or bend your knees. This uses your leg muscles instead of your back muscles.   Squat down when picking up something from the floor. Do not bend over.   Only use bed rest for short periods as told by your health care provider. Bed rest should only be used for the most severe episodes of back pain.  Standing, sitting, and lying down   Do not stand in one place for long periods of time.   Use good posture when sitting. Make sure your head rests over your shoulders and is not hanging forward. Use a pillow on your lower back if necessary.   Try sleeping on your side, preferably the left side, with a pregnancy support pillow or 1-2 regular pillows between your legs.  ? If you have back pain after a night's rest, your bed may be too soft.  ? A firm mattress may provide more support for your back during  pregnancy.  General instructions   Do not wear high heels.   Eat a healthy diet. Try to gain weight within your health care provider's recommendations.   Use a maternity girdle, elastic sling, or back brace as told by your health care provider.   Take over-the-counter and prescription medicines only as told by your health care provider.   Work with a physical therapist or massage therapist to find ways to manage back pain. Acupuncture or massage therapy may be helpful.   Keep all follow-up visits as told by your health care provider. This is important.  Contact a health care provider if:   Your back pain interferes with your daily activities.   You have increasing pain in other parts of your body.  Get help right away if:   You develop numbness, tingling, weakness, or problems with the use of your arms or legs.   You develop severe back pain that is not controlled with medicine.   You have a change in bowel or bladder control.   You develop shortness of breath, dizziness, or you faint.   You develop nausea, vomiting, or sweating.   You have back pain that is a rhythmic, cramping pain similar to labor pains. Labor   pain is usually 1-2 minutes apart, lasts for about 1 minute, and involves a bearing down feeling or pressure in your pelvis.   You have back pain and your water breaks or you have vaginal bleeding.   You have back pain or numbness that travels down your leg.   Your back pain developed after you fell.   You develop pain on one side of your back.   You see blood in your urine.   You develop skin blisters in the area of your back pain.  Summary   Back pain may be caused by several factors that are related to changes during your pregnancy.   Follow instructions as told by your health care provider for managing pain, stiffness, and swelling.   Exercise as told by your health care provider. Gentle exercise is the best way to prevent or manage back pain.   Take over-the-counter and  prescription medicines only as told by your health care provider.   Keep all follow-up visits as told by your health care provider. This is important.  This information is not intended to replace advice given to you by your health care provider. Make sure you discuss any questions you have with your health care provider.  Document Released: 10/21/2005 Document Revised: 12/29/2017 Document Reviewed: 12/29/2017  Elsevier Interactive Patient Education  2019 [employer]

## 2018-12-20 NOTE — OB Triage Note (Signed)
Pt 33 yo female G5P2 presents with c/o lower back pain, rates it as a 7/10, constant sharp cramping. Pt states lower back pain started Friday night with a few ctxs. Stated ctxs 14-17 min apart, counted 20 of them, rates pain as a 4/10. Pt states she has had 5-6 ctx today. Denies LOF, N/V/D, and vaginal discharge. Denies decreased fetal movement. Vitals stable, monitors applied.

## 2018-12-20 NOTE — Final Progress Note (Addendum)
L&D OB Triage Note  Janet Coleman is a 33 y.o. [id number] female at [redacted]w[redacted]d, EDD Estimated Date of Delivery: 01/15/19 who presented to triage for complaints of low back pain rated 7/10 and contractions since Friday.  She notes that she has been just trying to "deal with the pain until her next appointment".  She was evaluated by the nurses with no significant findings for labor, however there was a question of leakage of fluids on exam. Vital signs stable. An NST was performed and has been reviewed by MD. She was treated with PO Tylenol 1000 mg and Oxycodone 5 mg x 1 dose each.    Physical Exam:   Blood pressure 130/72, pulse 88, temperature 98.3 F (36.8 C), temperature source Oral, resp. rate 16, height 5\' 6"  (1.676 m), weight 78 kg, last menstrual period 04/10/2018, unknown if currently breastfeeding.  Dilation: 1 Effacement (%): Thick Cervical Position: Posterior Exam by:: Laural Benes RN    NST INTERPRETATION: Indications: rule out uterine contractions  Mode: External Baseline Rate (A): 140 bpm(fht) Variability: Moderate Accelerations: 15 x 15 Decelerations: None     Contraction Frequency (min): ctx x 1  Impression: reactive     Labs:  Results for orders placed or performed during the hospital encounter of 12/20/18  Urinalysis, Routine w reflex microscopic  Result Value Ref Range   Color, Urine STRAW (A) YELLOW   APPearance CLEAR (A) CLEAR   Specific Gravity, Urine 1.002 (L) 1.005 - 1.030   pH 7.0 5.0 - 8.0   Glucose, UA NEGATIVE NEGATIVE mg/dL   Hgb urine dipstick NEGATIVE NEGATIVE   Bilirubin Urine NEGATIVE NEGATIVE   Ketones, ur NEGATIVE NEGATIVE mg/dL   Protein, ur NEGATIVE NEGATIVE mg/dL   Nitrite NEGATIVE NEGATIVE   Leukocytes,Ua NEGATIVE NEGATIVE  ROM Plus ([facility] only)  Result Value Ref Range   Rom Plus NEGATIVE      Plan: NST performed was reviewed and was found to be reactive. All labs normal. She was discharged home with bleeding/preterm labor  precautions.  Continue routine prenatal care. Follow up with OB/GYN as previously scheduled.    Hildred Laser, MD  [facility]

## 2018-12-23 ENCOUNTER — Encounter: Payer: Self-pay | Admitting: Obstetrics and Gynecology

## 2018-12-23 ENCOUNTER — Ambulatory Visit (INDEPENDENT_AMBULATORY_CARE_PROVIDER_SITE_OTHER): Payer: Medicaid Other | Admitting: Obstetrics and Gynecology

## 2018-12-23 ENCOUNTER — Other Ambulatory Visit: Payer: Self-pay

## 2018-12-23 VITALS — BP 118/65 | HR 80 | Wt 176.4 lb

## 2018-12-23 DIAGNOSIS — M549 Dorsalgia, unspecified: Secondary | ICD-10-CM

## 2018-12-23 DIAGNOSIS — Z3483 Encounter for supervision of other normal pregnancy, third trimester: Secondary | ICD-10-CM

## 2018-12-23 DIAGNOSIS — O9989 Other specified diseases and conditions complicating pregnancy, childbirth and the puerperium: Secondary | ICD-10-CM

## 2018-12-23 DIAGNOSIS — Z3A36 36 weeks gestation of pregnancy: Secondary | ICD-10-CM

## 2018-12-23 LAB — POCT URINALYSIS DIPSTICK OB
Bilirubin, UA: NEGATIVE
Blood, UA: NEGATIVE
Glucose, UA: NEGATIVE
Ketones, UA: NEGATIVE
Leukocytes, UA: NEGATIVE
Nitrite, UA: NEGATIVE
POC,PROTEIN,UA: NEGATIVE
Spec Grav, UA: 1.02 (ref 1.010–1.025)
Urobilinogen, UA: 0.2 E.U./dL
pH, UA: 7 (ref 5.0–8.0)

## 2018-12-23 NOTE — Patient Instructions (Signed)
Back Pain in Pregnancy  Back pain during pregnancy is common. Back pain may be caused by several factors that are related to changes during your pregnancy.  Follow these instructions at home:  Managing pain, stiffness, and swelling          If directed, for sudden (acute) back pain, put ice on the painful area.  ? Put ice in a plastic bag.  ? Place a towel between your skin and the bag.  ? Leave the ice on for 20 minutes, 2-3 times per day.   If directed, apply heat to the affected area before you exercise. Use the heat source that your health care provider recommends, such as a moist heat pack or a heating pad.  ? Place a towel between your skin and the heat source.  ? Leave the heat on for 20-30 minutes.  ? Remove the heat if your skin turns bright red. This is especially important if you are unable to feel pain, heat, or cold. You may have a greater risk of getting burned.   If directed, massage the affected area.  Activity   Exercise as told by your health care provider. Gentle exercise is the best way to prevent or manage back pain.   Listen to your body when lifting. If lifting hurts, ask for help or bend your knees. This uses your leg muscles instead of your back muscles.   Squat down when picking up something from the floor. Do not bend over.   Only use bed rest for short periods as told by your health care provider. Bed rest should only be used for the most severe episodes of back pain.  Standing, sitting, and lying down   Do not stand in one place for long periods of time.   Use good posture when sitting. Make sure your head rests over your shoulders and is not hanging forward. Use a pillow on your lower back if necessary.   Try sleeping on your side, preferably the left side, with a pregnancy support pillow or 1-2 regular pillows between your legs.  ? If you have back pain after a night's rest, your bed may be too soft.  ? A firm mattress may provide more support for your back during  pregnancy.  General instructions   Do not wear high heels.   Eat a healthy diet. Try to gain weight within your health care provider's recommendations.   Use a maternity girdle, elastic sling, or back brace as told by your health care provider.   Take over-the-counter and prescription medicines only as told by your health care provider.   Work with a physical therapist or massage therapist to find ways to manage back pain. Acupuncture or massage therapy may be helpful.   Keep all follow-up visits as told by your health care provider. This is important.  Contact a health care provider if:   Your back pain interferes with your daily activities.   You have increasing pain in other parts of your body.  Get help right away if:   You develop numbness, tingling, weakness, or problems with the use of your arms or legs.   You develop severe back pain that is not controlled with medicine.   You have a change in bowel or bladder control.   You develop shortness of breath, dizziness, or you faint.   You develop nausea, vomiting, or sweating.   You have back pain that is a rhythmic, cramping pain similar to labor pains. Labor   pain is usually 1-2 minutes apart, lasts for about 1 minute, and involves a bearing down feeling or pressure in your pelvis.   You have back pain and your water breaks or you have vaginal bleeding.   You have back pain or numbness that travels down your leg.   Your back pain developed after you fell.   You develop pain on one side of your back.   You see blood in your urine.   You develop skin blisters in the area of your back pain.  Summary   Back pain may be caused by several factors that are related to changes during your pregnancy.   Follow instructions as told by your health care provider for managing pain, stiffness, and swelling.   Exercise as told by your health care provider. Gentle exercise is the best way to prevent or manage back pain.   Take over-the-counter and  prescription medicines only as told by your health care provider.   Keep all follow-up visits as told by your health care provider. This is important.  This information is not intended to replace advice given to you by your health care provider. Make sure you discuss any questions you have with your health care provider.  Document Released: 10/21/2005 Document Revised: 12/29/2017 Document Reviewed: 12/29/2017  Elsevier Interactive Patient Education  2019 [employer]

## 2018-12-23 NOTE — Progress Notes (Signed)
ROB: Patient notes back pain and pelvic bone pain has been ongoing for over a week. Sciatic pain. Was seen in triage last week for pain, notes that none of the medications given helped.  Discussed trying position changes to help with back pain. 36 week labs done today. RTC in 1 week.

## 2018-12-23 NOTE — Progress Notes (Signed)
ROB-Pt [redacted]w[redacted]d today and present for prenatal care and 36 week cultures. Pt stated that her she is having lower abd cramping, pelvic bone pain. Pt stated discomfort with walking, standing and bending.

## 2018-12-25 LAB — STREP GP B NAA: Strep Gp B NAA: NEGATIVE

## 2018-12-30 ENCOUNTER — Ambulatory Visit (INDEPENDENT_AMBULATORY_CARE_PROVIDER_SITE_OTHER): Payer: Medicaid Other | Admitting: Obstetrics and Gynecology

## 2018-12-30 ENCOUNTER — Encounter: Payer: Self-pay | Admitting: Obstetrics and Gynecology

## 2018-12-30 ENCOUNTER — Other Ambulatory Visit: Payer: Self-pay

## 2018-12-30 ENCOUNTER — Telehealth: Payer: Self-pay | Admitting: Obstetrics and Gynecology

## 2018-12-30 VITALS — BP 115/82 | HR 69 | Ht 66.0 in | Wt 173.5 lb

## 2018-12-30 DIAGNOSIS — Z3483 Encounter for supervision of other normal pregnancy, third trimester: Secondary | ICD-10-CM

## 2018-12-30 LAB — POCT URINALYSIS DIPSTICK OB
Bilirubin, UA: NEGATIVE
Blood, UA: NEGATIVE
Glucose, UA: NEGATIVE
Ketones, UA: NEGATIVE
Leukocytes, UA: NEGATIVE
Nitrite, UA: NEGATIVE
POC,PROTEIN,UA: NEGATIVE
Spec Grav, UA: 1.01 (ref 1.010–1.025)
Urobilinogen, UA: 0.2 E.U./dL
pH, UA: 7 (ref 5.0–8.0)

## 2018-12-30 NOTE — Telephone Encounter (Signed)
Notified lab results that was in.

## 2018-12-30 NOTE — Progress Notes (Signed)
ROB: Occasional contractions.  Nothing regular.  Patient reports normal fetal movement.  Signs and symptoms of labor discussed.

## 2018-12-30 NOTE — Progress Notes (Signed)
Patient comes in to for the ROB visit. No problems.

## 2018-12-30 NOTE — Telephone Encounter (Signed)
The patient called and stated that she forgot to mention a few things while she was here for her appointment today. The patient is requested a call back to got over her lab results, She mentioned that she believes the results are from recent beta labs. The patient did not disclose any other information. Please advise.

## 2018-12-31 LAB — GC/CHLAMYDIA PROBE AMP
Chlamydia trachomatis, NAA: NEGATIVE
Neisseria Gonorrhoeae by PCR: NEGATIVE

## 2019-01-06 ENCOUNTER — Other Ambulatory Visit: Payer: Self-pay

## 2019-01-06 ENCOUNTER — Ambulatory Visit (INDEPENDENT_AMBULATORY_CARE_PROVIDER_SITE_OTHER): Payer: Medicaid Other | Admitting: Obstetrics and Gynecology

## 2019-01-06 ENCOUNTER — Encounter: Payer: Self-pay | Admitting: Obstetrics and Gynecology

## 2019-01-06 VITALS — BP 120/67 | HR 89 | Wt 177.1 lb

## 2019-01-06 DIAGNOSIS — Z3483 Encounter for supervision of other normal pregnancy, third trimester: Secondary | ICD-10-CM

## 2019-01-06 DIAGNOSIS — R21 Rash and other nonspecific skin eruption: Secondary | ICD-10-CM

## 2019-01-06 LAB — POCT URINALYSIS DIPSTICK OB
Bilirubin, UA: NEGATIVE
Blood, UA: NEGATIVE
Glucose, UA: NEGATIVE
Ketones, UA: NEGATIVE
Nitrite, UA: NEGATIVE
POC,PROTEIN,UA: NEGATIVE
Spec Grav, UA: 1.01 (ref 1.010–1.025)
Urobilinogen, UA: 0.2 E.U./dL
pH, UA: 7.5 (ref 5.0–8.0)

## 2019-01-06 MED ORDER — HYDROCORTISONE 0.5 % EX CREA: 1.0000 "application " | TOPICAL_CREAM | Freq: Two times a day (BID) | CUTANEOUS | 0 refills | Status: DC

## 2019-01-06 NOTE — Progress Notes (Signed)
ROB: Patient noting small red bumps over abdomen with itching. Notes she ate seafood yesterday, usually has not had an allergic response in the past (to shrimp or fish), but did try scallops yesterday. Will prescribe hydrocortisone cream, advised on Benadryl for itching. Discussed labor precautions. RTC in 1 week. If no signs of labor, to discuss IOL by 41 weeks.

## 2019-01-06 NOTE — Progress Notes (Signed)
ROB-Pt present today for prenatal care. Pt stated that she has little red bumps all over her stomach area that is itching really bad. Pt stated having pain and pressure in the vaginal area and having cramping in the lower abd and pain.

## 2019-01-10 ENCOUNTER — Inpatient Hospital Stay
Admission: EM | Admit: 2019-01-10 | Discharge: 2019-01-11 | DRG: 807 | Disposition: A | Discharge status: Home / Self Care | Payer: Medicaid Other | Attending: Obstetrics and Gynecology | Admitting: Obstetrics and Gynecology

## 2019-01-10 ENCOUNTER — Other Ambulatory Visit: Payer: Self-pay

## 2019-01-10 ENCOUNTER — Inpatient Hospital Stay: Payer: Medicaid Other | Admitting: Anesthesiology

## 2019-01-10 DIAGNOSIS — O26893 Other specified pregnancy related conditions, third trimester: Secondary | ICD-10-CM | POA: Diagnosis present

## 2019-01-10 DIAGNOSIS — O99334 Smoking (tobacco) complicating childbirth: Secondary | ICD-10-CM | POA: Diagnosis present

## 2019-01-10 DIAGNOSIS — Z3A39 39 weeks gestation of pregnancy: Secondary | ICD-10-CM | POA: Diagnosis not present

## 2019-01-10 DIAGNOSIS — Z6791 Unspecified blood type, Rh negative: Secondary | ICD-10-CM | POA: Diagnosis not present

## 2019-01-10 DIAGNOSIS — O4202 Full-term premature rupture of membranes, onset of labor within 24 hours of rupture: Secondary | ICD-10-CM | POA: Diagnosis not present

## 2019-01-10 DIAGNOSIS — Z1159 Encounter for screening for other viral diseases: Secondary | ICD-10-CM | POA: Diagnosis not present

## 2019-01-10 DIAGNOSIS — K219 Gastro-esophageal reflux disease without esophagitis: Secondary | ICD-10-CM | POA: Diagnosis present

## 2019-01-10 DIAGNOSIS — F17210 Nicotine dependence, cigarettes, uncomplicated: Secondary | ICD-10-CM | POA: Diagnosis present

## 2019-01-10 DIAGNOSIS — O9962 Diseases of the digestive system complicating childbirth: Secondary | ICD-10-CM | POA: Diagnosis present

## 2019-01-10 DIAGNOSIS — O4290 Premature rupture of membranes, unspecified as to length of time between rupture and onset of labor, unspecified weeks of gestation: Secondary | ICD-10-CM | POA: Diagnosis present

## 2019-01-10 LAB — CBC
HCT: 34.9 % — ABNORMAL LOW (ref 36.0–46.0)
Hemoglobin: 12.1 g/dL (ref 12.0–15.0)
MCH: 33.7 pg (ref 26.0–34.0)
MCHC: 34.7 g/dL (ref 30.0–36.0)
MCV: 97.2 fL (ref 80.0–100.0)
Platelets: 163 10*3/uL (ref 150–400)
RBC: 3.59 MIL/uL — ABNORMAL LOW (ref 3.87–5.11)
RDW: 14.4 % (ref 11.5–15.5)
WBC: 12.1 10*3/uL — ABNORMAL HIGH (ref 4.0–10.5)
nRBC: 0 % (ref 0.0–0.2)

## 2019-01-10 LAB — RAPID HIV SCREEN (HIV 1/2 AB+AG)
HIV 1/2 Antibodies: NONREACTIVE
HIV-1 P24 Antigen - HIV24: NONREACTIVE

## 2019-01-10 LAB — RUPTURE OF MEMBRANE (ROM)PLUS: Rom Plus: POSITIVE

## 2019-01-10 LAB — SARS CORONAVIRUS 2 BY RT PCR (HOSPITAL ORDER, PERFORMED IN ~~LOC~~ HOSPITAL LAB): SARS Coronavirus 2: NEGATIVE

## 2019-01-10 LAB — ABO/RH: ABO/RH(D): O NEG

## 2019-01-10 MED ORDER — SOD CITRATE-CITRIC ACID 500-334 MG/5ML PO SOLN: 30.0000 mL | ORAL | Status: DC | PRN

## 2019-01-10 MED ORDER — SIMETHICONE 80 MG PO CHEW: 80.0000 mg | CHEWABLE_TABLET | ORAL | Status: DC | PRN

## 2019-01-10 MED ORDER — FENTANYL 2.5 MCG/ML W/ROPIVACAINE 0.15% IN NS 100 ML EPIDURAL (ARMC)
EPIDURAL | Status: AC
  Administered 2019-01-10: 10:00:00
  Filled 2019-01-10: qty 100

## 2019-01-10 MED ORDER — OXYTOCIN 10 UNIT/ML IJ SOLN
INTRAMUSCULAR | Status: AC
  Filled 2019-01-10: qty 2

## 2019-01-10 MED ORDER — LIDOCAINE HCL (PF) 1 % IJ SOLN
INTRAMUSCULAR | Status: AC
  Filled 2019-01-10: qty 30

## 2019-01-10 MED ORDER — OXYTOCIN 40 UNITS IN NORMAL SALINE INFUSION - SIMPLE MED
2.5000 [IU]/h | INTRAVENOUS | Status: DC | PRN
  Filled 2019-01-10: qty 1000

## 2019-01-10 MED ORDER — IBUPROFEN 600 MG PO TABS
600.0000 mg | ORAL_TABLET | Freq: Four times a day (QID) | ORAL | Status: DC
  Administered 2019-01-10 (×2): 600 mg via ORAL
  Filled 2019-01-10 (×2): qty 1

## 2019-01-10 MED ORDER — ONDANSETRON HCL 4 MG/2ML IJ SOLN: 4.0000 mg | Freq: Four times a day (QID) | INTRAMUSCULAR | Status: DC | PRN

## 2019-01-10 MED ORDER — OXYTOCIN 40 UNITS IN NORMAL SALINE INFUSION - SIMPLE MED
1.0000 m[IU]/min | INTRAVENOUS | Status: DC
  Administered 2019-01-10: 4 m[IU]/min via INTRAVENOUS

## 2019-01-10 MED ORDER — ACETAMINOPHEN 325 MG PO TABS
650.0000 mg | ORAL_TABLET | ORAL | Status: DC | PRN
  Administered 2019-01-11 (×2): 650 mg via ORAL
  Filled 2019-01-10 (×2): qty 2

## 2019-01-10 MED ORDER — TERBUTALINE SULFATE 1 MG/ML IJ SOLN: 0.2500 mg | Freq: Once | INTRAMUSCULAR | Status: DC | PRN

## 2019-01-10 MED ORDER — ZOLPIDEM TARTRATE 5 MG PO TABS: 5.0000 mg | ORAL_TABLET | Freq: Every evening | ORAL | Status: DC | PRN

## 2019-01-10 MED ORDER — OXYCODONE-ACETAMINOPHEN 5-325 MG PO TABS: 2.0000 | ORAL_TABLET | ORAL | Status: DC | PRN

## 2019-01-10 MED ORDER — LIDOCAINE HCL (PF) 1 % IJ SOLN: 30.0000 mL | INTRAMUSCULAR | Status: DC | PRN

## 2019-01-10 MED ORDER — ACETAMINOPHEN 325 MG PO TABS: 650.0000 mg | ORAL_TABLET | ORAL | Status: DC | PRN

## 2019-01-10 MED ORDER — BENZOCAINE-MENTHOL 20-0.5 % EX AERO
1.0000 "application " | INHALATION_SPRAY | CUTANEOUS | Status: DC | PRN
  Administered 2019-01-11: 1 via TOPICAL
  Filled 2019-01-10: qty 56

## 2019-01-10 MED ORDER — DIPHENHYDRAMINE HCL 25 MG PO CAPS: 25.0000 mg | ORAL_CAPSULE | Freq: Four times a day (QID) | ORAL | Status: DC | PRN

## 2019-01-10 MED ORDER — LACTATED RINGERS IV SOLN
INTRAVENOUS | Status: DC
  Administered 2019-01-10: 11:00:00 via INTRAVENOUS

## 2019-01-10 MED ORDER — PRENATAL MULTIVITAMIN CH
1.0000 | ORAL_TABLET | Freq: Every day | ORAL | Status: DC
  Administered 2019-01-10 – 2019-01-11 (×2): 1 via ORAL
  Filled 2019-01-10 (×2): qty 1

## 2019-01-10 MED ORDER — OXYTOCIN BOLUS FROM INFUSION
500.0000 mL | Freq: Once | INTRAVENOUS | Status: AC
  Administered 2019-01-10: 500 mL via INTRAVENOUS

## 2019-01-10 MED ORDER — BUTORPHANOL TARTRATE 2 MG/ML IJ SOLN: 1.0000 mg | INTRAMUSCULAR | Status: DC | PRN

## 2019-01-10 MED ORDER — OXYTOCIN 40 UNITS IN NORMAL SALINE INFUSION - SIMPLE MED
2.5000 [IU]/h | INTRAVENOUS | Status: DC
  Filled 2019-01-10 (×2): qty 1000

## 2019-01-10 MED ORDER — OXYCODONE-ACETAMINOPHEN 5-325 MG PO TABS: 1.0000 | ORAL_TABLET | ORAL | Status: DC | PRN

## 2019-01-10 MED ORDER — DOCUSATE SODIUM 100 MG PO CAPS
100.0000 mg | ORAL_CAPSULE | Freq: Two times a day (BID) | ORAL | Status: DC
  Administered 2019-01-10 – 2019-01-11 (×2): 100 mg via ORAL
  Filled 2019-01-10 (×2): qty 1

## 2019-01-10 MED ORDER — MISOPROSTOL 200 MCG PO TABS
ORAL_TABLET | ORAL | Status: AC
  Filled 2019-01-10: qty 4

## 2019-01-10 MED ORDER — TETANUS-DIPHTH-ACELL PERTUSSIS 5-2.5-18.5 LF-MCG/0.5 IM SUSP: 0.5000 mL | Freq: Once | INTRAMUSCULAR | Status: DC

## 2019-01-10 MED ORDER — AMMONIA AROMATIC IN INHA
RESPIRATORY_TRACT | Status: AC
  Filled 2019-01-10: qty 10

## 2019-01-10 MED ORDER — IBUPROFEN 600 MG PO TABS
600.0000 mg | ORAL_TABLET | Freq: Four times a day (QID) | ORAL | Status: DC
  Administered 2019-01-11 (×2): 600 mg via ORAL
  Filled 2019-01-10 (×2): qty 1

## 2019-01-10 MED ORDER — LACTATED RINGERS IV SOLN: 500.0000 mL | INTRAVENOUS | Status: DC | PRN

## 2019-01-10 NOTE — H&P (Signed)
Obstetric History and Physical  Janet Coleman is a 33 y.o. [id number] with IUP at [redacted]w[redacted]d presenting for complaints of contractions and possible ROM since ~ 3 AM.  Notes that she felt a pop. Patient states she has been having  irregular, every 5-7 minutes contractions, none vaginal bleeding, ruptured, clear fluid membranes, with active fetal movement.    Prenatal Course Source of Care: [facility]  with onset of care at 11 weeks Pregnancy complications or risks: Patient Active Problem List   Diagnosis Date Noted  . Premature rupture of membranes 01/10/2019  . Back pain in pregnancy 12/20/2018  . Rh negative state in antepartum period 06/28/2018  . Reactive hypoglycemia 02/21/2015  . Elevated cortisol level ([facility]) 02/21/2015  . Weight gain, abnormal 02/08/2015  . Hypoglycemia 02/08/2015  . GERD (gastroesophageal reflux disease)   . Hypertension   . Hypothyroidism   . Tobacco use   . Migraines    She plans to breastfeed She desires unsure method for postpartum contraception.   Prenatal labs and studies: ABO, Rh: --/--/O NEG Performed at [facility], [address] [place], [place] [address]  [phone]) Antibody: POS (06/16 3220) Rubella: 5.89 (12/03 1532) RPR: Non Reactive (12/03 1532)  HBsAg: Negative (12/03 1532)  HIV: NON REACTIVE (06/16 2542)  HCW:CBJSEGBT (05/29 1602) 1 hr Glucola  normal Genetic screening normal Anatomy US normal   Past Medical History:  Diagnosis Date  . GERD (gastroesophageal reflux disease)   . Hypertension   . Hypothyroidism   . 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  . Migraines   . Reactive hypoglycemia   . Reactive hypoglycemia   . Tobacco use   . Vaginal Pap smear, abnormal     Past Surgical History:  Procedure Laterality Date  . BELPHAROPTOSIS REPAIR     eye lid lift  . DILATION AND CURETTAGE OF UTERUS     x 2  . LEEP     x 2. last paps 2011, 2012, 2013 were  normal    OB History  Gravida Para Term Preterm AB Living  5 2 2   2 2   SAB TAB Ectopic Multiple Live Births  2       2    # Outcome Date GA Lbr Len/2nd Weight Sex Delivery Anes PTL Lv  5 Current           4 Term 2007 [redacted]w[redacted]d  3345 g F Vag-Spont   LIV  3 Term 2006 [redacted]w[redacted]d  3742 g F Vag-Spont   LIV  2 SAB           1 SAB             Social History   Socioeconomic History  . Marital status: Single    Spouse name: Not on file  . Number of children: Not on file  . Years of education: Not on file  . Highest education level: Not on file  Occupational History  . Not on file  Social Needs  . Financial resource strain: Not hard at all  . Food insecurity    Worry: Never true    Inability: Never true  . Transportation needs    Medical: No    Non-medical: No  Tobacco Use  . Smoking status: Current Every Day Smoker    Packs/day: 0.50    Years: 17.00    Pack years: 8.50    Types: Cigarettes  . Smokeless tobacco: Never Used  Substance and Sexual Activity  . Alcohol use: Not Currently    Comment: occass  . Drug use: No  . Sexual activity: Yes    Birth control/protection: None    Comment: plans to do pill  Lifestyle  . Physical activity    Days per week: Not on file    Minutes per session: Not on file  . Stress: Not on file  Relationships  . Social Musicianconnections    Talks on phone: Not on file    Gets together: Not on file    Attends religious service: Not on file    Active member of club or organization: Not on file    Attends meetings of clubs or organizations: Not on file    Relationship status: Not on file  Other Topics Concern  . Not on file  Social History Narrative  . Not on file    Family History  Problem Relation Age of Onset  . Hypertension Mother   . Autoimmune disease Mother   . Stroke Father   . Hypertension Father   . Heart attack Father   . Aneurysm Father        brain  . Heart disease Father   . Cancer Maternal Aunt        breast  . Cancer  Maternal Grandmother        breast  . Diabetes Maternal Grandmother   . Stroke Maternal Grandfather   . Diabetes Paternal Grandmother   . Heart disease Paternal Grandmother     Medications Prior to Admission  Medication Sig Dispense Refill Last Dose  . hydrocortisone cream 0.5 % Apply 1 application topically 2 (two) times daily. 30 g 0 Past Week at Unknown time  . pantoprazole (PROTONIX) 40 MG tablet Take 1 tablet (40 mg total) by mouth daily. 60 tablet 1 01/09/2019 at Unknown time  . Prenatal-DSS-FeCb-FeGl-FA (CITRANATAL BLOOM) 90-1 MG TABS Take 90 mg by mouth daily. 30 tablet 11 01/09/2019 at Unknown time    Allergies  Allergen Reactions  . Toradol [Ketorolac Tromethamine] Swelling  . Ketorolac Swelling    Review of Systems: Negative except for what is mentioned in HPI.  Physical Exam: BP 122/89   Pulse 84   Temp 97.7 F (36.5 C) (Oral)   Resp 16   Ht 5\' 6"  (1.676 m)   Wt 80.3 kg   LMP 04/10/2018 (LMP Unknown)   SpO2 96%   BMI 28.57 kg/m  CONSTITUTIONAL: Well-developed, well-nourished female in no acute distress.  HENT:  Normocephalic, atraumatic, External right and left ear normal. Oropharynx is clear and moist EYES: Conjunctivae and EOM are normal. Pupils are equal, round, and reactive to light. No scleral icterus.  NECK: Normal range of motion, supple, no masses SKIN: Skin is warm and dry. No rash noted. Not diaphoretic. No erythema. No pallor. NEUROLOGIC: Alert and oriented to person, place, and time. Normal reflexes, muscle tone coordination. No cranial nerve deficit noted. PSYCHIATRIC: Normal mood and affect. Normal behavior. Normal judgment and thought content. CARDIOVASCULAR: Normal heart rate noted, regular rhythm RESPIRATORY: Effort and breath sounds normal, no problems with respiration noted ABDOMEN: Soft, nontender, nondistended, gravid. MUSCULOSKELETAL: Normal range of motion. No edema and no tenderness. 2+ distal pulses.  Cervical Exam: Dilatation 2 cm    Effacement 70%   Station -1 (changed from 1/70/-2 at presentation) Presentation: cephalic FHT:  Baseline rate 135 bpm   Variability moderate  Accelerations present   Decelerations none Contractions: Every 10 mins   Pertinent Labs/Studies:  Results for orders placed or performed during the hospital encounter of 01/10/19 (from the past 24 hour(s))  ROM Plus ([facility] only)     Status: None   Collection Time: 01/10/19  5:14 AM  Result Value Ref Range   Rom Plus POSITIVE   CBC     Status: Abnormal   Collection Time: 01/10/19  6:38 AM  Result Value Ref Range   WBC 12.1 (H) 4.0 - 10.5 K/uL   RBC 3.59 (L) 3.87 - 5.11 MIL/uL   Hemoglobin 12.1 12.0 - 15.0 g/dL   HCT 82.934.9 (L) 56.236.0 - 13.046.0 %   MCV 97.2 80.0 - 100.0 fL   MCH 33.7 26.0 - 34.0 pg   MCHC 34.7 30.0 - 36.0 g/dL   RDW 86.514.4 78.411.5 - 69.615.5 %   Platelets 163 150 - 400 K/uL   nRBC 0.0 0.0 - 0.2 %  Type and screen [facility]     Status: None (Preliminary result)   Collection Time: 01/10/19  6:38 AM  Result Value Ref Range   ABO/RH(D) O NEG    Antibody Screen POS    Sample Expiration 01/13/2019,2359    Antibody Identification PASSIVELY ACQUIRED ANTI-D    Unit Number [id number]    Blood Component Type RED CELLS,LR    Unit division 00    Status of Unit ALLOCATED    Transfusion Status OK TO TRANSFUSE    Crossmatch Result      COMPATIBLE Performed at [facility], [address] [place], [place] [address]    Unit Number [id number]    Blood Component Type RED CELLS,LR    Unit division 00    Status of Unit ALLOCATED    Transfusion Status OK TO TRANSFUSE    Crossmatch Result COMPATIBLE   SARS Coronavirus 2 (CEPHEID - Performed in [facility] hospital lab), Hosp Order     Status: None   Collection Time: 01/10/19  6:38 AM   Specimen: Nasopharyngeal Swab  Result Value Ref Range   SARS Coronavirus 2 NEGATIVE NEGATIVE  Rapid HIV screen (HIV 1/2 Ab+Ag) ([facility] Only)     Status: None   Collection Time:  01/10/19  6:38 AM  Result Value Ref Range   HIV-1 P24 Antigen - HIV24 NON REACTIVE NON REACTIVE   HIV 1/2 Antibodies NON REACTIVE NON REACTIVE   Interpretation (HIV Ag Ab)      A non reactive test result means that HIV 1 or HIV 2 antibodies and HIV 1 p24 antigen were not detected in the specimen.  ABO/Rh     Status: None   Collection Time: 01/10/19 11:31 AM  Result Value Ref Range   ABO/RH(D)      [place] NEG Performed at [facility], [address] [place], [place] [address]     Assessment : Marilynn Raileifny A Coleman is a 33 y.o. [id number] at 6367w2d being admitted for PROM with onset of latent labor.  Plan: Labor: Expectant management for now. Augmentation as needed as per protocol. Analgesia as needed. FWB: Reassuring fetal heart tracing.  GBS negative Delivery plan: Hopeful for vaginal delivery   Hildred Laserherry, Mira Balon, MD [facility]

## 2019-01-10 NOTE — OB Triage Note (Signed)
Pt arrival to triage with c/o contractions starting about 0250.  Pt states possible SROM around 0250. Pt states spotting and loss of mucous plug. Pt is feeling baby move normally.  EFM and toco applied and assessing.

## 2019-01-11 LAB — TYPE AND SCREEN
ABO/RH(D): O NEG
Antibody Screen: POSITIVE
Unit division: 0
Unit division: 0

## 2019-01-11 LAB — BPAM RBC
Blood Product Expiration Date: 202007122359
Blood Product Expiration Date: 202007132359
Unit Type and Rh: 9500
Unit Type and Rh: 9500

## 2019-01-11 LAB — RPR: RPR Ser Ql: NONREACTIVE

## 2019-01-11 MED ORDER — BUPIVACAINE HCL (PF) 0.25 % IJ SOLN
INTRAMUSCULAR | Status: DC | PRN
  Administered 2019-01-10 (×2): 5 mL via EPIDURAL

## 2019-01-11 MED ORDER — FENTANYL 2.5 MCG/ML W/ROPIVACAINE 0.15% IN NS 100 ML EPIDURAL (ARMC)
EPIDURAL | Status: DC | PRN
  Administered 2019-01-10: 12 mL/h via EPIDURAL

## 2019-01-11 MED ORDER — LIDOCAINE-EPINEPHRINE (PF) 1.5 %-1:200000 IJ SOLN
INTRAMUSCULAR | Status: DC | PRN
  Administered 2019-01-10: 3 mL via PERINEURAL

## 2019-01-11 MED ORDER — LIDOCAINE HCL (PF) 1 % IJ SOLN
INTRAMUSCULAR | Status: DC | PRN
  Administered 2019-01-10: 3 mL

## 2019-01-11 NOTE — Discharge Summary (Signed)
                              Discharge Summary  Date of Admission: 01/10/2019  Date of Discharge: 01/11/2019  Admitting Diagnosis: Premature rupture of membrane at [redacted]w[redacted]d  Mode of Delivery: normal spontaneous vaginal delivery                 Discharge Diagnosis: No other diagnosis   Intrapartum Procedures: epidural and pitocin augmentation   Post partum procedures:   Complications: none                      Discharge Day SOAP Note:  Progress Note - Vaginal Delivery  Janet Coleman is a 33 y.o. Q2V9563 now PP day 1 s/p Vaginal, Spontaneous . Delivery was uncomplicated  Subjective  The patient has the following complaints: has no unusual complaints  Pain is controlled with current medications.   Patient is urinating without difficulty.  She is ambulating well.    Objective  Vital signs: BP 117/78 (BP Location: Left Arm)   Pulse 81   Temp 98.1 F (36.7 C) (Oral)   Resp 18   Ht 5\' 6"  (1.676 m)   Wt 80.3 kg   LMP 04/10/2018 (LMP Unknown)   SpO2 99%   Breastfeeding Unknown   BMI 28.57 kg/m   Physical Exam: Gen: NAD Fundus Fundal Tone: Firm  Lochia Amount: Small  Perineum Appearance: Intact     Data Review Labs: CBC Latest Ref Rng & Units 01/10/2019 01/17/2017 04/04/2015  WBC 4.0 - 10.5 K/uL 12.1(H) 10.8 11.5(H)  Hemoglobin 12.0 - 15.0 g/dL 12.1 14.6 14.3  Hematocrit 36.0 - 46.0 % 34.9(L) 40.8 42.2  Platelets 150 - 400 K/uL 163 179 180   O NEG Performed at [facility], [address] [place], [place] [address]   Assessment/Plan  Active Problems:   Premature rupture of membranes    Plan for discharge today.   Discharge Instructions: Per After Visit Summary. Activity: Advance as tolerated. Pelvic rest for 6 weeks.  Also refer to After Visit Summary Diet: Regular Medications: Allergies as of 01/11/2019      Reactions   Toradol [ketorolac Tromethamine] Swelling   Ketorolac Swelling      Medication List    STOP taking these  medications   hydrocortisone cream 0.5 %   pantoprazole 40 MG tablet Commonly known as: Protonix     TAKE these medications   CitraNatal Bloom 90-1 MG Tabs Take 90 mg by mouth daily.      Outpatient follow up:  Follow-up Information    Harlin Heys, MD Follow up in 6 week(s).   Specialties: Obstetrics and Gynecology, Radiology Contact information: [address] [place] [address] [phone]          Postpartum contraception: Will discuss at first office visit post-partum  Discharged Condition: good  Discharged to: home  Newborn Data: Disposition:home with mother  Apgars: APGAR (1 MIN): 8   APGAR (5 MINS): 9   APGAR (10 MINS):    Baby Feeding: Bottle    Finis Bud, M.D. 01/11/2019 9:30 AM

## 2019-01-11 NOTE — Lactation Note (Signed)
This note was copied from a baby's chart. Lactation Consultation Note  Patient Name: Janet Coleman Today's Date: 01/11/2019     Maternal Data Formula Feeding for Exclusion: Yes Reason for exclusion: Mother's choice to formula feed on admision  Feeding Feeding Type: Bottle Fed - Formula  LATCH Score                   Interventions    Lactation Tools Discussed/Used     Consult Status  Lactation spoke with mother about formula preparation, the correct way to mix formula and how to pace bottle feed to avoid overfeeding infant. Mother denies questions or concerns at this time.    Elvera Lennox 0/53/9767, 12:08 PM

## 2019-01-11 NOTE — Progress Notes (Signed)
Discharge order received from doctor. Reviewed discharge instructions with patient and answered all questions. Follow up appointment instructions given. Patient verbalized understanding. ID bands checked. Patient discharged home with infant via wheelchair by nursing/auxillary.   Joanann Mies Garner, RN  

## 2019-01-11 NOTE — Discharge Instructions (Signed)
Please call your doctor or return to the ER if you experience any chest pains, shortness of breath, dizziness, visual changes, fever greater than 101, any heavy bleeding (saturating more than 1 pad per hour), large clots, or foul smelling discharge, any worsening abdominal pain and cramping that is not controlled by pain medication, or any signs of postpartum depression. No tampons, enemas, douches, or sexual intercourse for 6 weeks. Also avoid tub baths, hot tubs, or swimming for 6 weeks.  °

## 2019-01-11 NOTE — Anesthesia Postprocedure Evaluation (Signed)
Anesthesia Post Note  Patient: Janet Coleman  Procedure(s) Performed: AN AD HOC LABOR EPIDURAL  Patient location during evaluation: Mother Baby Anesthesia Type: Epidural Level of consciousness: awake and alert and oriented Pain management: satisfactory to patient Vital Signs Assessment: post-procedure vital signs reviewed and stable Respiratory status: respiratory function stable Cardiovascular status: stable Postop Assessment: no backache, no headache, epidural receding, no apparent nausea or vomiting, patient able to bend at knees, adequate PO intake and able to ambulate Anesthetic complications: no     Last Vitals:  Vitals:   01/10/19 2315 01/11/19 0321  BP: 114/70 117/78  Pulse: 82 81  Resp: 18 18  Temp: 36.7 C 36.7 C  SpO2: 98% 99%    Last Pain:  Vitals:   01/11/19 0321  TempSrc: Oral  PainSc:                  Blima Singer

## 2019-01-11 NOTE — Anesthesia Procedure Notes (Signed)
Epidural Patient location during procedure: OB Start time: 01/10/2019 10:05 AM End time: 01/10/2019 10:25 AM  Staffing Resident/CRNA: Nelda Marseille, CRNA Performed: resident/CRNA   Preanesthetic Checklist Completed: patient identified, site marked, surgical consent, pre-op evaluation, timeout performed, IV checked, risks and benefits discussed and monitors and equipment checked  Epidural Patient position: sitting Prep: Betadine Patient monitoring: heart rate, continuous pulse ox and blood pressure Approach: midline Location: L3-L4 Injection technique: LOR saline  Needle:  Needle type: Tuohy  Needle gauge: 18 G Needle length: 9 cm and 9 Catheter type: closed end flexible Catheter size: 20 Guage Test dose: negative and 1.5% lidocaine with Epi 1:200 K  Assessment Sensory level: T10 Events: blood not aspirated, injection not painful, no injection resistance, negative IV test and no paresthesia  Additional Notes   Patient tolerated the insertion well without complications.Reason for block:procedure for pain

## 2019-01-11 NOTE — Anesthesia Preprocedure Evaluation (Signed)
Anesthesia Evaluation  Patient identified by MRN, date of birth, ID band Patient awake    Reviewed: Allergy & Precautions, H&P , NPO status , Patient's Chart, lab work & pertinent test results  History of Anesthesia Complications Negative for: history of anesthetic complications  Airway Mallampati: II  TM Distance: <3 FB Neck ROM: full    Dental no notable dental hx.    Pulmonary neg pulmonary ROS, Current Smoker,    Pulmonary exam normal        Cardiovascular hypertension, negative cardio ROS Normal cardiovascular exam     Neuro/Psych negative neurological ROS  negative psych ROS   GI/Hepatic negative GI ROS, Neg liver ROS,   Endo/Other  negative endocrine ROS  Renal/GU negative Renal ROS  negative genitourinary   Musculoskeletal   Abdominal   Peds  Hematology negative hematology ROS (+)   Anesthesia Other Findings   Reproductive/Obstetrics (+) Pregnancy                             Anesthesia Physical Anesthesia Plan  ASA: II  Anesthesia Plan: Epidural   Post-op Pain Management:    Induction:   PONV Risk Score and Plan:   Airway Management Planned:   Additional Equipment:   Intra-op Plan:   Post-operative Plan:   Informed Consent: I have reviewed the patients History and Physical, chart, labs and discussed the procedure including the risks, benefits and alternatives for the proposed anesthesia with the patient or authorized representative who has indicated his/her understanding and acceptance.       Plan Discussed with: Anesthesiologist and CRNA  Anesthesia Plan Comments:         Anesthesia Quick Evaluation

## 2019-01-13 ENCOUNTER — Encounter: Payer: Medicaid Other | Admitting: Obstetrics and Gynecology

## 2019-01-13 ENCOUNTER — Telehealth: Payer: Self-pay | Admitting: Obstetrics and Gynecology

## 2019-01-13 NOTE — Telephone Encounter (Signed)
The patient called and stated that she is experiencing right leg and foot swelling. The patient stated the swelling goes down when she keeps her feet elevated. Pt is requesting a call back, Please advise.

## 2019-01-13 NOTE — Telephone Encounter (Signed)
Spoke with pt and she stated that her feet has been swollen after walking all day. Pt stated that she has elevated her feet and noticed that the swelling in her feet goes down and little. Advised pt to increase water intake, elevate feet every time she sits down, wear compression socks, decrease salt intake, caffeine intake,eat more potassium-rich foods, wear loose fitting clothing (no tight jeans)  and try messaging her legs. Pt did stated that she has been doing a lot of walking, cleaning up around the house and go up and down the stairs. Pt also stated that she has increased her soda intake as well. Pt was asked to check her legs by pressing into the area to see if would leave an indentation. Pt stated that it did not. Pt was advised to try doing some of the suggestions and see if her swelling decrease. Pt was advised that if her swelling increase to please see care at an urgent care of hospital. informed pt that I will call her on Monday to see how she is doing.

## 2019-01-18 NOTE — Telephone Encounter (Signed)
Pt was called to check on her since the last phone call. Pt stated that the swelling in her legs has decreased since our last phone call. Pt stated that she was doing a lot better now.

## 2019-01-23 NOTE — Anesthesia Post-op Follow-up Note (Signed)
Anesthesia QCDR form completed.        

## 2019-02-21 ENCOUNTER — Ambulatory Visit (INDEPENDENT_AMBULATORY_CARE_PROVIDER_SITE_OTHER): Payer: Medicaid Other | Admitting: Obstetrics and Gynecology

## 2019-02-21 ENCOUNTER — Other Ambulatory Visit: Payer: Self-pay

## 2019-02-21 ENCOUNTER — Encounter: Payer: Self-pay | Admitting: Obstetrics and Gynecology

## 2019-02-21 DIAGNOSIS — Z30011 Encounter for initial prescription of contraceptive pills: Secondary | ICD-10-CM

## 2019-02-21 MED ORDER — NORETHIN ACE-ETH ESTRAD-FE 1-20 MG-MCG PO TABS: 1.0000 | ORAL_TABLET | Freq: Every day | ORAL | 2 refills | Status: DC

## 2019-02-21 NOTE — Progress Notes (Signed)
Patient comes in today for PPV. She is bottle feeding with formula. She is wanting to take birth control pill.

## 2019-02-21 NOTE — Progress Notes (Signed)
.dje HPI:      [name] is a 33 y.o. [id number] who LMP was No LMP recorded.  Subjective:   She presents today for her postpartum checkup.  She reports no problems.  She has had spotting and continues to have a small amount of bleeding every day since delivery.  She has not resumed intercourse.  She would like to start OCPs for birth control.  She is currently bottlefeeding.    Hx: The following portions of the patient's history were reviewed and updated as appropriate:             She  has a past medical history of GERD (gastroesophageal reflux disease), Hypertension, Hypothyroidism, Localized morphea, Migraines, Reactive hypoglycemia, Reactive hypoglycemia, Tobacco use, and Vaginal Pap smear, abnormal. She does not have any pertinent problems on file. She  has a past surgical history that includes Dilation and curettage of uterus; LEEP; and Blepharoptosis repair. Her family history includes Aneurysm in her father; Autoimmune disease in her mother; Cancer in her maternal aunt and maternal grandmother; Diabetes in her maternal grandmother and paternal grandmother; Heart attack in her father; Heart disease in her father and paternal grandmother; Hypertension in her father and mother; Stroke in her father and maternal grandfather. She  reports that she has quit smoking. Her smoking use included cigarettes. She has a 8.50 pack-year smoking history. She quit smokeless tobacco use 3 days ago. She reports previous alcohol use. She reports that she does not use drugs. She has a current medication list which includes the following prescription(s): citranatal bloom and norethindrone-ethinyl estradiol. She is allergic to toradol [ketorolac tromethamine] and ketorolac.       Review of Systems:  Review of Systems  Constitutional: Denied constitutional symptoms, night sweats, recent illness, fatigue, fever, insomnia and weight loss.  Eyes: Denied eye symptoms, eye pain, photophobia, vision change  and visual disturbance.  Ears/Nose/Throat/Neck: Denied ear, nose, throat or neck symptoms, hearing loss, nasal discharge, sinus congestion and sore throat.  Cardiovascular: Denied cardiovascular symptoms, arrhythmia, chest pain/pressure, edema, exercise intolerance, orthopnea and palpitations.  Respiratory: Denied pulmonary symptoms, asthma, pleuritic pain, productive sputum, cough, dyspnea and wheezing.  Gastrointestinal: Denied, gastro-esophageal reflux, melena, nausea and vomiting.  Genitourinary: Denied genitourinary symptoms including symptomatic vaginal discharge, pelvic relaxation issues, and urinary complaints.  Musculoskeletal: Denied musculoskeletal symptoms, stiffness, swelling, muscle weakness and myalgia.  Dermatologic: Denied dermatology symptoms, rash and scar.  Neurologic: Denied neurology symptoms, dizziness, headache, neck pain and syncope.  Psychiatric: Denied psychiatric symptoms, anxiety and depression.  Endocrine: Denied endocrine symptoms including hot flashes and night sweats.   Meds:   Current Outpatient Medications on File Prior to Visit  Medication Sig Dispense Refill  . Prenatal-DSS-FeCb-FeGl-FA (CITRANATAL BLOOM) 90-1 MG TABS Take 90 mg by mouth daily. 30 tablet 11   No current facility-administered medications on file prior to visit.     Objective:     Vitals:   02/21/19 1336  BP: 117/81  Pulse: 74              Pelvic examination   Pelvic:   Vulva: Normal appearance.  No lesions.  No abnormal scarring.    Vagina: No lesions or abnormalities noted.  Support: Normal pelvic support.  Urethra No masses tenderness or scarring.  Meatus Normal size without lesions or prolapse.  Cervix: Normal ectropion.  No lesions.  Anus: Normal exam.  No lesions.  Perineum: Normal exam.  No lesions.  Healed well.  Bimanual   Uterus: Normal size.  Non-tender.  Mobile.  AV.  Adnexae: No masses.  Non-tender to palpation.  Cul-de-sac: Negative for abnormality.      Assessment:    [id number] Patient Active Problem List   Diagnosis Date Noted  . Premature rupture of membranes 01/10/2019  . Back pain in pregnancy 12/20/2018  . Rh negative state in antepartum period 06/28/2018  . Reactive hypoglycemia 02/21/2015  . Elevated cortisol level (HCC) 02/21/2015  . Weight gain, abnormal 02/08/2015  . Hypoglycemia 02/08/2015  . GERD (gastroesophageal reflux disease)   . Hypertension   . Hypothyroidism   . Tobacco use   . Migraines      1. Postpartum care and examination immediately after delivery   2. Initiation of OCP (BCP)     Patient doing very well postpartum-without issue.  Desires OCPs   Plan:            1.  Patient may resume normal activities with exception of heavy lifting.  2.  OCPs The risks /benefits of OCPs have been explained to the patient in detail.  Product literature has been given to her.  I have instructed her in the use of OCPs and have given her literature reinforcing this information.  I have explained to the patient that OCPs are not as effective for birth control during the first month of use, and that another form of contraception should be used during this time.  Both first-day start and Sunday start have been explained.  The risks and benefits of each was discussed.  She has been made aware of  the fact that other medications may affect the efficacy of OCPs.  I have answered all of her questions, and I believe that she has an understanding of the effectiveness and use of OCPs.  Orders No orders of the defined types were placed in this encounter.    Meds ordered this encounter  Medications  . norethindrone-ethinyl estradiol (JUNEL FE 1/20) 1-20 MG-MCG tablet    Sig: Take 1 tablet by mouth daily.    Dispense:  3 Package    Refill:  2      F/U  Return in about 5 months (around 07/24/2019) for Annual Physical.  Elonda Huskyavid J. Gevin Perea, M.D. 02/21/2019 1:56 PM

## 2019-03-15 ENCOUNTER — Telehealth: Payer: Self-pay | Admitting: Obstetrics and Gynecology

## 2019-03-15 NOTE — Telephone Encounter (Signed)
Patient called stating she has been bleeding continuously since she started taking birth control. She has bled through a tampon and pad every 2 hours. Please Advise. Thanks

## 2019-03-15 NOTE — Telephone Encounter (Signed)
Pt has been on Junel fe 1/20 since 7/29. For the last 12 days pt has been bleeding heavy.   Wearing tampon and pad changing every 2 hours. Mild cramps.  Encourage MVI with FE. If soaking a pad/tampon q 30 minutes to see ED. Pt voices understanding.   Wants something different. pls advise

## 2019-03-16 MED ORDER — NORETHINDRONE ACETATE 5 MG PO TABS: 5.0000 mg | ORAL_TABLET | Freq: Every day | ORAL | 1 refills | Status: DC

## 2019-03-16 NOTE — Addendum Note (Signed)
Addended by: Elouise Munroe on: 03/16/2019 04:47 PM   Modules accepted: Orders

## 2019-03-17 NOTE — Telephone Encounter (Signed)
PER DJE  Pt to start Aygestin 1 po qd on day 3 of placebo. She will continue with 1 ocp and 1 Aygestin qd for 30 days.   Pt voices understanding.

## 2019-04-17 ENCOUNTER — Telehealth: Payer: Self-pay | Admitting: Obstetrics and Gynecology

## 2019-04-17 NOTE — Telephone Encounter (Signed)
Spoke with patient and she stated that she is still having the irregular period. She is bleeding through he BC pills as well. She is taking the Junel and the Aygestin as well. This is not helping. She would like to be switched to another [facility].

## 2019-04-17 NOTE — Telephone Encounter (Signed)
The patient called requesting a call back from Dr. Amalia Hailey nurse if possible today. Pt did not disclose any other information. Please advise.

## 2019-04-18 MED ORDER — DESOGESTREL-ETHINYL ESTRADIOL 0.15-0.02/0.01 MG (21/5) PO TABS: 1.0000 | ORAL_TABLET | Freq: Every day | ORAL | 2 refills | Status: DC

## 2019-04-18 NOTE — Telephone Encounter (Signed)
Prescription for new [facility] sent to the pharmacy. I have let the patient know that prescription has been sent in.

## 2019-05-09 DIAGNOSIS — M7542 Impingement syndrome of left shoulder: Secondary | ICD-10-CM | POA: Diagnosis not present

## 2019-07-25 ENCOUNTER — Encounter: Payer: Self-pay | Admitting: Obstetrics and Gynecology

## 2019-07-25 ENCOUNTER — Other Ambulatory Visit (HOSPITAL_COMMUNITY)
Admission: RE | Admit: 2019-07-25 | Discharge: 2019-07-25 | Disposition: A | Discharge status: Home / Self Care | Payer: Medicaid Other | Source: Ambulatory Visit | Attending: Obstetrics and Gynecology | Admitting: Obstetrics and Gynecology

## 2019-07-25 ENCOUNTER — Other Ambulatory Visit: Payer: Self-pay

## 2019-07-25 ENCOUNTER — Ambulatory Visit (INDEPENDENT_AMBULATORY_CARE_PROVIDER_SITE_OTHER): Payer: Medicaid Other | Admitting: Obstetrics and Gynecology

## 2019-07-25 VITALS — BP 146/94 | HR 77 | Ht 66.0 in | Wt 166.3 lb

## 2019-07-25 DIAGNOSIS — Z3009 Encounter for other general counseling and advice on contraception: Secondary | ICD-10-CM

## 2019-07-25 DIAGNOSIS — Z124 Encounter for screening for malignant neoplasm of cervix: Secondary | ICD-10-CM

## 2019-07-25 DIAGNOSIS — Z30011 Encounter for initial prescription of contraceptive pills: Secondary | ICD-10-CM

## 2019-07-25 DIAGNOSIS — Z0000 Encounter for general adult medical examination without abnormal findings: Secondary | ICD-10-CM

## 2019-07-25 DIAGNOSIS — Z01419 Encounter for gynecological examination (general) (routine) without abnormal findings: Secondary | ICD-10-CM

## 2019-07-25 NOTE — Progress Notes (Signed)
HPI:      [name] is a 33 y.o. [phone] who LMP was Patient's last menstrual period was 07/18/2019.  Subjective:   She presents today for her annual examination.  She tried OCPs and has significant breakthrough bleeding whereupon she added progesterone and continue to bleed.  It was not until she stopped the pills and then 3 weeks later did her bleeding resolved.  Since that time she has had regular cycles.  She does desire something for birth control would like to discuss it further today.  Her history includes use of Mirena IUD where she had pelvic pain and something poking her while she was standing and sitting.  She had to have it removed for the pain to resolve.  She is open to the idea of reconsidering OCPs.    Hx: The following portions of the patient's history were reviewed and updated as appropriate:             She  has a past medical history of GERD (gastroesophageal reflux disease), Hypertension, Hypothyroidism, Localized morphea, Migraines, Reactive hypoglycemia, Reactive hypoglycemia, Tobacco use, and Vaginal Pap smear, abnormal. She does not have any pertinent problems on file. She  has a past surgical history that includes Dilation and curettage of uterus; LEEP; and Blepharoptosis repair. Her family history includes Aneurysm in her father; Autoimmune disease in her mother; Cancer in her maternal aunt and maternal grandmother; Diabetes in her maternal grandmother and paternal grandmother; Heart attack in her father; Heart disease in her father and paternal grandmother; Hypertension in her father and mother; Stroke in her father and maternal grandfather. She  reports that she has quit smoking. Her smoking use included cigarettes. She has a 8.50 pack-year smoking history. She quit smokeless tobacco use about 5 months ago. She reports previous alcohol use. She reports that she does not use drugs. She has a current medication list which includes the following prescription(s):  desogestrel-ethinyl estradiol, norethindrone, norethindrone-ethinyl estradiol, and citranatal bloom. She is allergic to toradol [ketorolac tromethamine] and ketorolac.       Review of Systems:  Review of Systems  Constitutional: Denied constitutional symptoms, night sweats, recent illness, fatigue, fever, insomnia and weight loss.  Eyes: Denied eye symptoms, eye pain, photophobia, vision change and visual disturbance.  Ears/Nose/Throat/Neck: Denied ear, nose, throat or neck symptoms, hearing loss, nasal discharge, sinus congestion and sore throat.  Cardiovascular: Denied cardiovascular symptoms, arrhythmia, chest pain/pressure, edema, exercise intolerance, orthopnea and palpitations.  Respiratory: Denied pulmonary symptoms, asthma, pleuritic pain, productive sputum, cough, dyspnea and wheezing.  Gastrointestinal: Denied, gastro-esophageal reflux, melena, nausea and vomiting.  Genitourinary: Denied genitourinary symptoms including symptomatic vaginal discharge, pelvic relaxation issues, and urinary complaints.  Musculoskeletal: Denied musculoskeletal symptoms, stiffness, swelling, muscle weakness and myalgia.  Dermatologic: Denied dermatology symptoms, rash and scar.  Neurologic: Denied neurology symptoms, dizziness, headache, neck pain and syncope.  Psychiatric: Denied psychiatric symptoms, anxiety and depression.  Endocrine: Denied endocrine symptoms including hot flashes and night sweats.   Meds:   Current Outpatient Medications on File Prior to Visit  Medication Sig Dispense Refill  . desogestrel-ethinyl estradiol (MIRCETTE) 0.15-0.02/0.01 MG (21/5) tablet Take 1 tablet by mouth at bedtime. (Patient not taking: Reported on 07/25/2019) 3 Package 2  . norethindrone (AYGESTIN) 5 MG tablet Take 1 tablet (5 mg total) by mouth daily. (Patient not taking: Reported on 07/25/2019) 30 tablet 1  . norethindrone-ethinyl estradiol (JUNEL FE 1/20) 1-20 MG-MCG tablet Take 1 tablet by mouth daily.  (Patient not taking: Reported on 07/25/2019)  3 Package 2  . Prenatal-DSS-FeCb-FeGl-FA (CITRANATAL BLOOM) 90-1 MG TABS Take 90 mg by mouth daily. (Patient not taking: Reported on 07/25/2019) 30 tablet 11   No current facility-administered medications on file prior to visit.    Objective:     Vitals:   07/25/19 1338  BP: (!) 146/94  Pulse: 77              Physical examination General NAD, Conversant  HEENT Atraumatic; Op clear with mmm.  Normo-cephalic. Pupils reactive. Anicteric sclerae  Thyroid/Neck Smooth without nodularity or enlargement. Normal ROM.  Neck Supple.  Skin No rashes, lesions or ulceration. Normal palpated skin turgor. No nodularity.  Breasts: No masses or discharge.  Symmetric.  No axillary adenopathy.  Lungs: Clear to auscultation.No rales or wheezes. Normal Respiratory effort, no retractions.  Heart: NSR.  No murmurs or rubs appreciated. No periferal edema  Abdomen: Soft.  Non-tender.  No masses.  No HSM. No hernia  Extremities: Moves all appropriately.  Normal ROM for age. No lymphadenopathy.  Neuro: Oriented to PPT.  Normal mood. Normal affect.     Pelvic:   Vulva: Normal appearance.  No lesions.  Vagina: No lesions or abnormalities noted.  Support: Normal pelvic support.  Urethra No masses tenderness or scarring.  Meatus Normal size without lesions or prolapse.  Cervix: Normal appearance.  No lesions.  Anus: Normal exam.  No lesions.  Perineum: Normal exam.  No lesions.        Bimanual   Uterus: Normal size.  Non-tender.  Mobile.  AV.  Adnexae: No masses.  Non-tender to palpation.  Cul-de-sac: Negative for abnormality.      Assessment:    [id number] Patient Active Problem List   Diagnosis Date Noted  . Premature rupture of membranes 01/10/2019  . Back pain in pregnancy 12/20/2018  . Rh negative state in antepartum period 06/28/2018  . Reactive hypoglycemia 02/21/2015  . Elevated cortisol level 02/21/2015  . Weight gain, abnormal 02/08/2015  .  Hypoglycemia 02/08/2015  . GERD (gastroesophageal reflux disease)   . Hypertension   . Hypothyroidism   . Tobacco use   . Migraines      1. Well woman exam with routine gynecological exam   2. Initiation of OCP (BCP)   3. Birth control counseling        Plan:            1.  Basic Screening Recommendations The basic screening recommendations for asymptomatic women were discussed with the patient during her visit.  The age-appropriate recommendations were discussed with her and the rational for the tests reviewed.  When I am informed by the patient that another primary care physician has previously obtained the age-appropriate tests and they are up-to-date, only outstanding tests are ordered and referrals given as necessary.  Abnormal results of tests will be discussed with her when all of her results are completed.  Routine preventative health maintenance measures emphasized: Exercise/Diet/Weight control, Tobacco Warnings, Alcohol/Substance use risks and Stress Management Pap performed 2.  Birth Control I discussed multiple birth control options and methods with the patient.  The risks and benefits of each were reviewed. Patient will call [place] with a birth control pill that she took "a long time ago" and it worked for her.  She would like to try this kind.  If she cannot find the pack at home to tell us what it was I would plan on prescribing low Loestrin. Orders No orders of the defined types were placed in this  encounter.   No orders of the defined types were placed in this encounter.       F/U  No follow-ups on file.  Elonda Huskyavid J. Karalee Hauter, M.D. 07/25/2019 2:11 PM

## 2019-07-25 NOTE — Addendum Note (Signed)
Addended by: Durwin Glaze on: 07/25/2019 04:28 PM   Modules accepted: Orders

## 2019-07-26 ENCOUNTER — Telehealth: Payer: Self-pay

## 2019-07-26 NOTE — Telephone Encounter (Signed)
Is the [facility] she was on.

## 2019-07-26 NOTE — Telephone Encounter (Signed)
Pt would like Dr. Amalia Hailey to know that she'd like to  use microgestine for her birth control.

## 2019-07-27 ENCOUNTER — Telehealth: Payer: Self-pay | Admitting: Obstetrics and Gynecology

## 2019-07-27 MED ORDER — DESOGESTREL-ETHINYL ESTRADIOL 0.15-0.02/0.01 MG (21/5) PO TABS: 1.0000 | ORAL_TABLET | Freq: Every day | ORAL | 3 refills | Status: DC

## 2019-07-27 NOTE — Telephone Encounter (Signed)
Pt called in and said that she was waiting birth control. That the pharmacy said its not there. Please advise.

## 2019-07-31 NOTE — Telephone Encounter (Signed)
prescription was sent to pharmacy.

## 2019-08-02 LAB — CYTOLOGY - PAP
Comment: NEGATIVE
Diagnosis: NEGATIVE
High risk HPV: NEGATIVE

## 2019-08-29 ENCOUNTER — Telehealth: Payer: Self-pay

## 2019-08-29 NOTE — Telephone Encounter (Signed)
Patient is having yeast infection symptoms. Please call to advis. Would like to be seen today

## 2019-08-29 NOTE — Telephone Encounter (Signed)
I just saw your message. Would you like for me to call her this week and get her on the sched? Please advise

## 2019-11-02 ENCOUNTER — Telehealth: Payer: Self-pay | Admitting: Obstetrics and Gynecology

## 2019-11-02 NOTE — Telephone Encounter (Signed)
Patient called in stating that she received two bills from [place], however she had medicaid and she had it at the time of her appointments that she was sent a bill for. Patient has already contacted her social worker however they told her she needed to call her doctors office and ask them to rebill her visits? I informed patient that we didn't have a billing office here and that she would need to contact billing however she stated that she has already done that and they referred her back to her social worker. I told patient I would send this over to our office manager to see what we could do for her.

## 2019-11-03 NOTE — Telephone Encounter (Signed)
Pt states on dos 06/30/2018 she had mediciad but it was not filled.   Sent to [facility] for review.

## 2019-11-07 ENCOUNTER — Telehealth: Payer: Self-pay | Admitting: Nurse Practitioner

## 2019-11-07 NOTE — Telephone Encounter (Signed)
Scheduled appt for 11/10/19 pt states that her job is ok with blood draw.

## 2019-11-07 NOTE — Telephone Encounter (Signed)
Will need appointment as has not been seen since 2019, please inform her we only do blood work to assess and not skin test.

## 2019-11-07 NOTE — Telephone Encounter (Signed)
Mailbox full unable to leave message.   Stacy Maye response below.   The charge was sent to collections on 07/01/19 due to no response from the patient.  The claim was for [facility] 04/06/18, and it was submitted to Medicaid, but Medicaid denied the claim due to another insurance primary on the [place].  I last worked this charge on 05/20/18.  Per [employer], [employer] policy [id number] was listed as primary per their records.   [facility] record showed the policy was effective 07/27/17-11/23/17.  I transferred the balance to patient responsibility because they are responsible for contacting their insurance to update COB.   Per [employer], as of today, the same [employer] is still listed as primary for this Dos, which shows the patient never contacted her caseworker to update the COB.  The patient received billing statements dated 06/26/18, 07/31/18, and 09/04/18 showing her patient responsibility.

## 2019-11-07 NOTE — Telephone Encounter (Signed)
Copied from CRM [id number]. Topic: General - Other >> Nov 07, 2019 11:01 AM Gwenlyn Fudge wrote: Reason for CRM: Pt called and is requesting to have a TB test done. Please advise.

## 2019-11-08 ENCOUNTER — Telehealth: Payer: Self-pay | Admitting: Obstetrics and Gynecology

## 2019-11-08 NOTE — Telephone Encounter (Signed)
Please advise. Thanks Leoni Goodness 

## 2019-11-08 NOTE — Telephone Encounter (Signed)
Patient will need to be seen.

## 2019-11-08 NOTE — Telephone Encounter (Signed)
Patient would like to come in to drop off a urine sample she thinks she has a uti. Is this okay to do?

## 2019-11-10 ENCOUNTER — Encounter: Payer: Self-pay | Admitting: Nurse Practitioner

## 2019-11-10 ENCOUNTER — Ambulatory Visit (INDEPENDENT_AMBULATORY_CARE_PROVIDER_SITE_OTHER): Payer: Medicaid Other | Admitting: Nurse Practitioner

## 2019-11-10 ENCOUNTER — Other Ambulatory Visit: Payer: Self-pay

## 2019-11-10 VITALS — BP 136/81 | HR 81 | Temp 98.0°F | Wt 167.5 lb

## 2019-11-10 DIAGNOSIS — Z111 Encounter for screening for respiratory tuberculosis: Secondary | ICD-10-CM

## 2019-11-10 NOTE — Patient Instructions (Signed)
Healthy Eating Following a healthy eating pattern may help you to achieve and maintain a healthy body weight, reduce the risk of chronic disease, and live a long and productive life. It is important to follow a healthy eating pattern at an appropriate calorie level for your body. Your nutritional needs should be met primarily through food by choosing a variety of nutrient-rich foods. What are tips for following this plan? Reading food labels  Read labels and choose the following: ? Reduced or low sodium. ? Juices with 100% fruit juice. ? Foods with low saturated fats and high polyunsaturated and monounsaturated fats. ? Foods with whole grains, such as whole wheat, cracked wheat, brown rice, and wild rice. ? Whole grains that are fortified with folic acid. This is recommended for women who are pregnant or who want to become pregnant.  Read labels and avoid the following: ? Foods with a lot of added sugars. These include foods that contain brown sugar, corn sweetener, corn syrup, dextrose, fructose, glucose, high-fructose corn syrup, honey, invert sugar, lactose, malt syrup, maltose, molasses, raw sugar, sucrose, trehalose, or turbinado sugar.  Do not eat more than the following amounts of added sugar per day:  6 teaspoons (25 g) for women.  9 teaspoons (38 g) for men. ? Foods that contain processed or refined starches and grains. ? Refined grain products, such as white flour, degermed cornmeal, white bread, and white rice. Shopping  Choose nutrient-rich snacks, such as vegetables, whole fruits, and nuts. Avoid high-calorie and high-sugar snacks, such as potato chips, fruit snacks, and candy.  Use oil-based dressings and spreads on foods instead of solid fats such as butter, stick margarine, or cream cheese.  Limit pre-made sauces, mixes, and "instant" products such as flavored rice, instant noodles, and ready-made pasta.  Try more plant-protein sources, such as tofu, tempeh, black beans,  edamame, lentils, nuts, and seeds.  Explore eating plans such as the Mediterranean diet or vegetarian diet. Cooking  Use oil to saut or stir-fry foods instead of solid fats such as butter, stick margarine, or lard.  Try baking, boiling, grilling, or broiling instead of frying.  Remove the fatty part of meats before cooking.  Steam vegetables in water or broth. Meal planning   At meals, imagine dividing your plate into fourths: ? One-half of your plate is fruits and vegetables. ? One-fourth of your plate is whole grains. ? One-fourth of your plate is protein, especially lean meats, poultry, eggs, tofu, beans, or nuts.  Include low-fat dairy as part of your daily diet. Lifestyle  Choose healthy options in all settings, including home, work, school, restaurants, or stores.  Prepare your food safely: ? Wash your hands after handling raw meats. ? Keep food preparation surfaces clean by regularly washing with hot, soapy water. ? Keep raw meats separate from ready-to-eat foods, such as fruits and vegetables. ? Cook seafood, meat, poultry, and eggs to the recommended internal temperature. ? Store foods at safe temperatures. In general:  Keep cold foods at 59F (4.4C) or below.  Keep hot foods at 159F (60C) or above.  Keep your freezer at [facility] (-17.8C) or below.  Foods are no longer safe to eat when they have been between the temperatures of 40-159F (4.4-60C) for more than 2 hours. What foods should I eat? Fruits Aim to eat 2 cup-equivalents of fresh, canned (in natural juice), or frozen fruits each day. Examples of 1 cup-equivalent of fruit include 1 small apple, 8 large strawberries, 1 cup canned fruit,  cup  dried fruit, or 1 cup 100% juice. Vegetables Aim to eat 2-3 cup-equivalents of fresh and frozen vegetables each day, including different varieties and colors. Examples of 1 cup-equivalent of vegetables include 2 medium carrots, 2 cups raw, leafy greens, 1 cup chopped  vegetable (raw or cooked), or 1 medium baked potato. Grains Aim to eat 6 ounce-equivalents of whole grains each day. Examples of 1 ounce-equivalent of grains include 1 slice of bread, 1 cup ready-to-eat cereal, 3 cups popcorn, or  cup cooked rice, pasta, or cereal. Meats and other proteins Aim to eat 5-6 ounce-equivalents of protein each day. Examples of 1 ounce-equivalent of protein include 1 egg, 1/2 cup nuts or seeds, or 1 tablespoon (16 g) peanut butter. A cut of meat or fish that is the size of a deck of cards is about 3-4 ounce-equivalents.  Of the protein you eat each week, try to have at least 8 ounces come from seafood. This includes salmon, trout, herring, and anchovies. Dairy Aim to eat 3 cup-equivalents of fat-free or low-fat dairy each day. Examples of 1 cup-equivalent of dairy include 1 cup (240 mL) milk, 8 ounces (250 g) yogurt, 1 ounces (44 g) natural cheese, or 1 cup (240 mL) fortified soy milk. Fats and oils  Aim for about 5 teaspoons (21 g) per day. Choose monounsaturated fats, such as canola and olive oils, avocados, peanut butter, and most nuts, or polyunsaturated fats, such as sunflower, corn, and soybean oils, walnuts, pine nuts, sesame seeds, sunflower seeds, and flaxseed. Beverages  Aim for six 8-oz glasses of water per day. Limit coffee to three to five 8-oz cups per day.  Limit caffeinated beverages that have added calories, such as soda and energy drinks.  Limit alcohol intake to no more than 1 drink a day for nonpregnant women and 2 drinks a day for men. One drink equals 12 oz of beer (355 mL), 5 oz of wine (148 mL), or 1 oz of hard liquor (44 mL). Seasoning and other foods  Avoid adding excess amounts of salt to your foods. Try flavoring foods with herbs and spices instead of salt.  Avoid adding sugar to foods.  Try using oil-based dressings, sauces, and spreads instead of solid fats. This information is based on general [place] nutrition guidelines. For more  information, visit [employer]. Exact amounts may vary based on your nutrition needs. Summary  A healthy eating plan may help you to maintain a healthy weight, reduce the risk of chronic diseases, and stay active throughout your life.  Plan your meals. Make sure you eat the right portions of a variety of nutrient-rich foods.  Try baking, boiling, grilling, or broiling instead of frying.  Choose healthy options in all settings, including home, work, school, restaurants, or stores. This information is not intended to replace advice given to you by your health care provider. Make sure you discuss any questions you have with your health care provider. Document Revised: 10/25/2017 Document Reviewed: 10/25/2017 Elsevier Patient Education  [place].

## 2019-11-10 NOTE — Progress Notes (Signed)
BP 136/81 (BP Location: Left Arm, Patient Position: Sitting, Cuff Size: Normal)   Pulse 81   Temp 98 F (36.7 C) (Oral)   Wt 167 lb 8 oz (76 kg)   SpO2 98%   BMI 27.04 kg/m    Subjective:    Patient ID: Janet Coleman, female    DOB: [birth year], 34 y.o.   MRN: [id number]  HPI: Janet Coleman is a 34 y.o. female  Chief Complaint  Patient presents with  . 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  Needs screening for job and will need print out of lab report.  Does not have MyChart.    Relevant past medical, surgical, family and social history reviewed and updated as indicated. Interim medical history since our last visit reviewed. Allergies and medications reviewed and updated.  Review of Systems  Constitutional: Negative for activity change, appetite change, diaphoresis, fatigue and fever.  Respiratory: Negative for cough, chest tightness and shortness of breath.   Cardiovascular: Negative for chest pain, palpitations and leg swelling.  Gastrointestinal: Negative.   Neurological: Negative.   Psychiatric/Behavioral: Negative.     Per HPI unless specifically indicated above     Objective:    BP 136/81 (BP Location: Left Arm, Patient Position: Sitting, Cuff Size: Normal)   Pulse 81   Temp 98 F (36.7 C) (Oral)   Wt 167 lb 8 oz (76 kg)   SpO2 98%   BMI 27.04 kg/m   Wt Readings from Last 3 Encounters:  11/10/19 167 lb 8 oz (76 kg)  07/25/19 166 lb 4.8 oz (75.4 kg)  02/21/19 166 lb 14.4 oz (75.7 kg)    Physical Exam Vitals and nursing note reviewed.  Constitutional:      General: She is awake. She is not in acute distress.    Appearance: She is well-developed and well-groomed. She is not ill-appearing.  HENT:     Head: Normocephalic.     Right Ear: Hearing normal.     Left Ear: Hearing normal.  Eyes:     General: Lids are normal.        Right eye: No  discharge.        Left eye: No discharge.     Conjunctiva/sclera: Conjunctivae normal.     Pupils: Pupils are equal, round, and reactive to light.  Neck:     Vascular: No carotid bruit.  Cardiovascular:     Rate and Rhythm: Normal rate and regular rhythm.     Heart sounds: Normal heart sounds. No murmur. No gallop.   Pulmonary:     Effort: Pulmonary effort is normal. No accessory muscle usage or respiratory distress.     Breath sounds: Normal breath sounds.  Abdominal:     General: Bowel sounds are normal.     Palpations: Abdomen is soft. There is no hepatomegaly or splenomegaly.  Musculoskeletal:     Cervical back: Normal range of motion and neck supple.     Right lower leg: No edema.     Left lower leg: No edema.  Skin:    General: Skin is warm and dry.  Neurological:     Mental Status: She is alert and oriented to person, place, and time.  Psychiatric:        Attention and Perception: Attention normal.        Mood and Affect: Mood normal.  Speech: Speech normal.        Behavior: Behavior normal. Behavior is cooperative.     Results for orders placed or performed in visit on 07/25/19  Cytology - PAP  Result Value Ref Range   High risk HPV Negative    Adequacy      Satisfactory for evaluation; transformation zone component PRESENT.   Diagnosis      - Negative for intraepithelial lesion or malignancy (NILM)   Comment Normal Reference Range HPV - Negative       Assessment & Plan:   Problem List Items Addressed This Visit    None    Visit Diagnoses    Screening-pulmonary TB    -  Primary   Quantiferon Gold testing obtained and will report to patient once resulted.   Relevant Orders   QuantiFERON-TB Gold Plus       Follow up plan: Return if symptoms worsen or fail to improve.

## 2019-11-13 LAB — QUANTIFERON-TB GOLD PLUS
QuantiFERON Mitogen Value: >10 IU/mL
QuantiFERON Nil Value: 0.06 IU/mL
QuantiFERON TB1 Ag Value: 0.08 IU/mL
QuantiFERON TB2 Ag Value: 0.06 IU/mL
QuantiFERON-TB Gold Plus: NEGATIVE

## 2019-11-13 NOTE — Progress Notes (Signed)
Please let [place] know her TB testing returned negative.  She may want print out of this and if so please print out for her to pick up for school.  Thank you.

## 2019-11-20 NOTE — Telephone Encounter (Signed)
Pt aware. Advised her to contact her case worker and have [employer] removed as primary insurance.   Pt appreciative of call.

## 2019-11-20 NOTE — Telephone Encounter (Signed)
VM full. Unable to leave message.

## 2019-12-14 NOTE — Telephone Encounter (Signed)
Completed.

## 2020-03-12 ENCOUNTER — Encounter: Payer: Self-pay | Admitting: Obstetrics and Gynecology

## 2020-03-12 ENCOUNTER — Ambulatory Visit (INDEPENDENT_AMBULATORY_CARE_PROVIDER_SITE_OTHER): Payer: Medicaid Other | Admitting: Obstetrics and Gynecology

## 2020-03-12 ENCOUNTER — Other Ambulatory Visit: Payer: Self-pay

## 2020-03-12 VITALS — BP 118/77 | HR 66 | Ht 66.0 in | Wt 165.0 lb

## 2020-03-12 DIAGNOSIS — N912 Amenorrhea, unspecified: Secondary | ICD-10-CM

## 2020-03-12 DIAGNOSIS — O09521 Supervision of elderly multigravida, first trimester: Secondary | ICD-10-CM | POA: Diagnosis not present

## 2020-03-12 NOTE — Progress Notes (Signed)
HPI:      [name] is a 34 y.o. [phone] who LMP was Patient's last menstrual period was 02/02/2020.  Subjective:   She presents today for pregnancy confirmation.  She stopped OCPs and her cycle "finally got regular".  She had several normal menses and then got pregnant.  She is not yet taking prenatal vitamins.  She has occasional nausea without vomiting. She has reactive hypoglycemia and her blood sugars naturally stay between 50 and 80 requiring her to eat 6 times per day (small meals).    Hx: The following portions of the patient's history were reviewed and updated as appropriate:             She  has a past medical history of GERD (gastroesophageal reflux disease), Hypertension, Hypothyroidism, Localized morphea, Migraines, Reactive hypoglycemia, Reactive hypoglycemia, Tobacco use, and Vaginal Pap smear, abnormal. She does not have any pertinent problems on file. She  has a past surgical history that includes Dilation and curettage of uterus; LEEP; and Blepharoptosis repair. Her family history includes Aneurysm in her father; Autoimmune disease in her mother; Cancer in her maternal aunt and maternal grandmother; Diabetes in her maternal grandmother and paternal grandmother; Heart attack in her father; Heart disease in her father and paternal grandmother; Hypertension in her father and mother; Stroke in her father and maternal grandfather. She  reports that she has been smoking cigarettes. She has a 4.25 pack-year smoking history. She has never used smokeless tobacco. She reports previous alcohol use. She reports that she does not use drugs. She has a current medication list which includes the following prescription(s): citranatal bloom. She is allergic to toradol [ketorolac tromethamine] and ketorolac.       Review of Systems:  Review of Systems  Constitutional: Denied constitutional symptoms, night sweats, recent illness, fatigue, fever, insomnia and weight loss.  Eyes: Denied  eye symptoms, eye pain, photophobia, vision change and visual disturbance.  Ears/Nose/Throat/Neck: Denied ear, nose, throat or neck symptoms, hearing loss, nasal discharge, sinus congestion and sore throat.  Cardiovascular: Denied cardiovascular symptoms, arrhythmia, chest pain/pressure, edema, exercise intolerance, orthopnea and palpitations.  Respiratory: Denied pulmonary symptoms, asthma, pleuritic pain, productive sputum, cough, dyspnea and wheezing.  Gastrointestinal: Denied, gastro-esophageal reflux, melena, nausea and vomiting.  Genitourinary: Denied genitourinary symptoms including symptomatic vaginal discharge, pelvic relaxation issues, and urinary complaints.  Musculoskeletal: Denied musculoskeletal symptoms, stiffness, swelling, muscle weakness and myalgia.  Dermatologic: Denied dermatology symptoms, rash and scar.  Neurologic: Denied neurology symptoms, dizziness, headache, neck pain and syncope.  Psychiatric: Denied psychiatric symptoms, anxiety and depression.  Endocrine: Denied endocrine symptoms including hot flashes and night sweats.   Meds:   Current Outpatient Medications on File Prior to Visit  Medication Sig Dispense Refill  . Prenatal-DSS-FeCb-FeGl-FA (CITRANATAL BLOOM) 90-1 MG TABS Take 1 tablet by mouth daily.     No current facility-administered medications on file prior to visit.    Objective:     Vitals:   03/12/20 1514  BP: 118/77  Pulse: 66              Urinary pregnancy test positive  Assessment:    [id number] Patient Active Problem List   Diagnosis Date Noted  . Premature rupture of membranes 01/10/2019  . Back pain in pregnancy 12/20/2018  . Rh negative state in antepartum period 06/28/2018  . Elevated cortisol level 02/21/2015  . Hypoglycemia 02/08/2015  . GERD (gastroesophageal reflux disease)   . Hypertension   . Hypothyroidism   . Tobacco use   .  Migraines      1. Amenorrhea   2. Multigravida of advanced maternal age in first  trimester     Approximately 5 weeks based on last menstrual period dating   Plan:           Prenatal Plan 1.  The patient was given prenatal literature. 2.  She was begun on prenatal vitamins. 3.  A prenatal lab panel was ordered or drawn. 4.  An ultrasound was ordered to better determine an EDC. 5.  A nurse visit was scheduled. 6.  Genetic testing and testing for other inheritable conditions discussed in detail. She will decide in the future whether to have these labs performed. 7.  A general overview of pregnancy testing, visit schedule, ultrasound schedule, and prenatal care was discussed. 8.  COVID and its risks associated with pregnancy, prevention by limiting exposure and use of masks, as well as the risks and benefits of vaccination during pregnancy were discussed in detail.  [facility] policy regarding office and hospital visitation and testing was explained. 9.  Benefits of breast-feeding discussed in detail including both maternal and infant benefits. Ready Set Baby website discussed.  Patient did not breast-feed last pregnancy but " would consider it" for this one.   Orders No orders of the defined types were placed in this encounter.   No orders of the defined types were placed in this encounter.     F/U  Return in about 6 weeks (around 04/23/2020). I spent 23 minutes involved in the care of this patient preparing to see the patient by obtaining and reviewing her medical history (including labs, imaging tests and prior procedures), documenting clinical information in the electronic health record (EHR), counseling and coordinating care plans, writing and sending prescriptions, ordering tests or procedures and directly communicating with the patient by discussing pertinent items from her history and physical exam as well as detailing my assessment and plan as noted above so that she has an informed understanding.  All of her questions were answered.  Elonda Husky,  M.D. 03/12/2020 3:39 PM

## 2020-03-19 ENCOUNTER — Other Ambulatory Visit: Payer: Self-pay | Admitting: Obstetrics and Gynecology

## 2020-03-19 ENCOUNTER — Other Ambulatory Visit: Payer: Medicaid Other

## 2020-03-19 ENCOUNTER — Other Ambulatory Visit: Payer: Self-pay

## 2020-03-19 ENCOUNTER — Other Ambulatory Visit (INDEPENDENT_AMBULATORY_CARE_PROVIDER_SITE_OTHER): Payer: Medicaid Other

## 2020-03-19 DIAGNOSIS — Z3687 Encounter for antenatal screening for uncertain dates: Secondary | ICD-10-CM

## 2020-03-19 DIAGNOSIS — Z3A01 Less than 8 weeks gestation of pregnancy: Secondary | ICD-10-CM

## 2020-03-19 DIAGNOSIS — Z789 Other specified health status: Secondary | ICD-10-CM

## 2020-03-21 ENCOUNTER — Other Ambulatory Visit: Payer: Self-pay | Admitting: Obstetrics and Gynecology

## 2020-03-21 ENCOUNTER — Telehealth: Payer: Self-pay | Admitting: Obstetrics and Gynecology

## 2020-03-21 MED ORDER — CITRANATAL B-CALM 20-1 MG & 2 X 25 MG PO MISC: 3.0000 | Freq: Every day | ORAL | 11 refills | Status: DC

## 2020-03-21 NOTE — Telephone Encounter (Signed)
Patient called in stating that she came in for a pregnancy confirmation appointment and that some prenatal vitamins were supposed to be called into her pharmacy however they are stating that they haven't received anything from [place]. Confirmed pharmacy with patient- correct pharmacy is on file. Could you please advise?

## 2020-03-21 NOTE — Telephone Encounter (Signed)
Tried to call patient to let her know that I have sent prescription to the pharmacy. Mailbox is full.

## 2020-03-21 NOTE — Telephone Encounter (Signed)
Spoke with patient and let her know that prenatal vitamin has been sent to the pharmacy.

## 2020-03-22 ENCOUNTER — Telehealth: Payer: Self-pay | Admitting: Obstetrics and Gynecology

## 2020-03-22 NOTE — Telephone Encounter (Signed)
Spoke with patient and let her know that any of the vaccines are fine. I did let her know that the Pfeizer vaccine is FDA approved now. Patient verbalized understanding.

## 2020-03-22 NOTE — Telephone Encounter (Signed)
Patient was considering getting the COVID vaccine and wanted to know if there was a specific one she should get?  Could you please advise?  Informed her that her provider was out of the office and he returns on Tuesday 8/21. Asked patient to allow her provider 24-48 hours starting at the time he arrives back in the office to respond. Patient verbalized understanding.

## 2020-04-04 ENCOUNTER — Other Ambulatory Visit: Payer: Self-pay

## 2020-04-04 ENCOUNTER — Other Ambulatory Visit: Payer: Medicaid Other

## 2020-04-04 ENCOUNTER — Ambulatory Visit (INDEPENDENT_AMBULATORY_CARE_PROVIDER_SITE_OTHER): Payer: Medicaid Other

## 2020-04-04 VITALS — BP 112/70 | HR 84 | Ht 66.0 in | Wt 164.2 lb

## 2020-04-04 DIAGNOSIS — Z0283 Encounter for blood-alcohol and blood-drug test: Secondary | ICD-10-CM | POA: Diagnosis not present

## 2020-04-04 DIAGNOSIS — Z113 Encounter for screening for infections with a predominantly sexual mode of transmission: Secondary | ICD-10-CM | POA: Diagnosis not present

## 2020-04-04 DIAGNOSIS — Z3481 Encounter for supervision of other normal pregnancy, first trimester: Secondary | ICD-10-CM | POA: Diagnosis not present

## 2020-04-04 LAB — OB RESULTS CONSOLE VARICELLA ZOSTER ANTIBODY, IGG: Varicella: IMMUNE

## 2020-04-04 MED ORDER — CITRANATAL BLOOM 90-1 MG PO TABS: 1.0000 | ORAL_TABLET | Freq: Every day | ORAL | 11 refills | Status: DC

## 2020-04-04 NOTE — Patient Instructions (Signed)
WHAT OB PATIENTS CAN EXPECT   Confirmation of pregnancy and ultrasound ordered if medically indicated-[redacted] weeks gestation  New OB (NOB) intake with nurse and New OB (NOB) labs- [redacted] weeks gestation  New OB (NOB) physical examination with provider- 11/[redacted] weeks gestation  Flu vaccine-[redacted] weeks gestation  Anatomy scan-[redacted] weeks gestation  Glucose tolerance test, blood work to test for anemia, T-dap vaccine-[redacted] weeks gestation  Vaginal swabs/cultures-STD/Group B strep-[redacted] weeks gestation  Appointments every 4 weeks until 28 weeks  Every 2 weeks from 28 weeks until 36 weeks  Weekly visits from 36 weeks until delivery  How a Baby Grows During Pregnancy  Pregnancy begins when a female's sperm enters a female's egg (fertilization). Fertilization usually happens in one of the tubes (fallopian tubes) that connect the ovaries to the womb (uterus). The fertilized egg moves down the fallopian tube to the uterus. Once it reaches the uterus, it implants into the lining of the uterus and begins to grow. For the first 10 weeks, the fertilized egg is called an embryo. After 10 weeks, it is called a fetus. As the fetus continues to grow, it receives oxygen and nutrients through tissue (placenta) that grows to support the developing baby. The placenta is the life support system for the baby. It provides oxygen and nutrition and removes waste. Learning as much as you can about your pregnancy and how your baby is developing can help you enjoy the experience. It can also make you aware of when there might be a problem and when to ask questions. How long does a typical pregnancy last? A pregnancy usually lasts 280 days, or about 40 weeks. Pregnancy is divided into three periods of growth, also called trimesters:  First trimester: 0-12 weeks.  Second trimester: 13-27 weeks.  Third trimester: 28-40 weeks. The day when your baby is ready to be born (full term) is your estimated date of delivery. How does my baby  develop month by month? First month  The fertilized egg attaches to the inside of the uterus.  Some cells will form the placenta. Others will form the fetus.  The arms, legs, brain, spinal cord, lungs, and heart begin to develop.  At the end of the first month, the heart begins to beat. Second month  The bones, inner ear, eyelids, hands, and feet form.  The genitals develop.  By the end of 8 weeks, all major organs are developing. Third month  All of the internal organs are forming.  Teeth develop below the gums.  Bones and muscles begin to grow. The spine can flex.  The skin is transparent.  Fingernails and toenails begin to form.  Arms and legs continue to grow longer, and hands and feet develop.  The fetus is about 3 inches (7.6 cm) long. Fourth month  The placenta is completely formed.  The external sex organs, neck, outer ear, eyebrows, eyelids, and fingernails are formed.  The fetus can hear, swallow, and move its arms and legs.  The kidneys begin to produce urine.  The skin is covered with a white, waxy coating (vernix) and very fine hair (lanugo). Fifth month  The fetus moves around more and can be felt for the first time (quickening).  The fetus starts to sleep and wake up and may begin to suck its finger.  The nails grow to the end of the fingers.  The organ in the digestive system that makes bile (gallbladder) functions and helps to digest nutrients.  If your baby is a girl, eggs  are present in her ovaries. If your baby is a boy, testicles start to move down into his scrotum. Sixth month  The lungs are formed.  The eyes open. The brain continues to develop.  Your baby has fingerprints and toe prints. Your baby's hair grows thicker.  At the end of the second trimester, the fetus is about 9 inches (22.9 cm) long. Seventh month  The fetus kicks and stretches.  The eyes are developed enough to sense changes in light.  The hands can make a  grasping motion.  The fetus responds to sound. Eighth month  All organs and body systems are fully developed and functioning.  Bones harden, and taste buds develop. The fetus may hiccup.  Certain areas of the brain are still developing. The skull remains soft. Ninth month  The fetus gains about  lb (0.23 kg) each week.  The lungs are fully developed.  Patterns of sleep develop.  The fetus's head typically moves into a head-down position (vertex) in the uterus to prepare for birth.  The fetus weighs 6-9 lb (2.72-4.08 kg) and is 19-20 inches (48.26-50.8 cm) long. What can I do to have a healthy pregnancy and help my baby develop? General instructions  Take prenatal vitamins as directed by your health care provider. These include vitamins such as folic acid, iron, calcium, and vitamin D. They are important for healthy development.  Take medicines only as directed by your health care provider. Read labels and ask a pharmacist or your health care provider whether over-the-counter medicines, supplements, and prescription drugs are safe to take during pregnancy.  Keep all follow-up visits as directed by your health care provider. This is important. Follow-up visits include prenatal care and screening tests. How do I know if my baby is developing well? At each prenatal visit, your health care provider will do several different tests to check on your health and keep track of your baby's development. These include:  Fundal height and position. ? Your health care provider will measure your growing belly from your pubic bone to the top of the uterus using a tape measure. ? Your health care provider will also feel your belly to determine your baby's position.  Heartbeat. ? An ultrasound in the first trimester can confirm pregnancy and show a heartbeat, depending on how far along you are. ? Your health care provider will check your baby's heart rate at every prenatal visit.  Second  trimester ultrasound. ? This ultrasound checks your baby's development. It also may show your baby's gender. What should I do if I have concerns about my baby's development? Always talk with your health care provider about any concerns that you may have about your pregnancy and your baby. Summary  A pregnancy usually lasts 280 days, or about 40 weeks. Pregnancy is divided into three periods of growth, also called trimesters.  Your health care provider will monitor your baby's growth and development throughout your pregnancy.  Follow your health care provider's recommendations about taking prenatal vitamins and medicines during your pregnancy.  Talk with your health care provider if you have any concerns about your pregnancy or your developing baby. This information is not intended to replace advice given to you by your health care provider. Make sure you discuss any questions you have with your health care provider. Document Revised: 11/03/2018 Document Reviewed: 05/26/2017 Elsevier Patient Education  2020 [employer]. Commonly Asked Questions During Pregnancy  Cats: A parasite can be excreted in cat feces.  To avoid exposure  you need to have another person empty the little box.  If you must empty the litter box you will need to wear gloves.  Wash your hands after handling your cat.  This parasite can also be found in raw or undercooked meat so this should also be avoided.  Colds, Sore Throats, Flu: Please check your medication sheet to see what you can take for symptoms.  If your symptoms are unrelieved by these medications please call the office.  Dental Work: Most any dental work Investment banker, corporate recommends is permitted.  X-rays should only be taken during the first trimester if absolutely necessary.  Your abdomen should be shielded with a lead apron during all x-rays.  Please notify your provider prior to receiving any x-rays.  Novocaine is fine; gas is not recommended.  If your dentist  requires a note from [place] prior to dental work please call the office and we will provide one for you.  Exercise: Exercise is an important part of staying healthy during your pregnancy.  You may continue most exercises you were accustomed to prior to pregnancy.  Later in your pregnancy you will most likely notice you have difficulty with activities requiring balance like riding a bicycle.  It is important that you listen to your body and avoid activities that put you at a higher risk of falling.  Adequate rest and staying well hydrated are a must!  If you have questions about the safety of specific activities ask your provider.    Exposure to Children with illness: Try to avoid obvious exposure; report any symptoms to [place] when noted,  If you have chicken pos, red measles or mumps, you should be immune to these diseases.   Please do not take any vaccines while pregnant unless you have checked with your OB provider.  Fetal Movement: After 28 weeks we recommend you do "kick counts" twice daily.  Lie or sit down in a calm quiet environment and count your baby movements "kicks".  You should feel your baby at least 10 times per hour.  If you have not felt 10 kicks within the first hour get up, walk around and have something sweet to eat or drink then repeat for an additional hour.  If count remains less than 10 per hour notify your provider.  Fumigating: Follow your pest control agent's advice as to how long to stay out of your home.  Ventilate the area well before re-entering.  Hemorrhoids:   Most over-the-counter preparations can be used during pregnancy.  Check your medication to see what is safe to use.  It is important to use a stool softener or fiber in your diet and to drink lots of liquids.  If hemorrhoids seem to be getting worse please call the office.   Hot Tubs:  Hot tubs Jacuzzis and saunas are not recommended while pregnant.  These increase your internal body temperature and should be  avoided.  Intercourse:  Sexual intercourse is safe during pregnancy as long as you are comfortable, unless otherwise advised by your provider.  Spotting may occur after intercourse; report any bright red bleeding that is heavier than spotting.  Labor:  If you know that you are in labor, please go to the hospital.  If you are unsure, please call the office and let us help you decide what to do.  Lifting, straining, etc:  If your job requires heavy lifting or straining please check with your provider for any limitations.  Generally, you should not lift items  heavier than that you can lift simply with your hands and arms (no back muscles)  Painting:  Paint fumes do not harm your pregnancy, but may make you ill and should be avoided if possible.  Latex or water based paints have less odor than oils.  Use adequate ventilation while painting.  Permanents & Hair Color:  Chemicals in hair dyes are not recommended as they cause increase hair dryness which can increase hair loss during pregnancy.  " Highlighting" and permanents are allowed.  Dye may be absorbed differently and permanents may not hold as well during pregnancy.  Sunbathing:  Use a sunscreen, as skin burns easily during pregnancy.  Drink plenty of fluids; avoid over heating.  Tanning Beds:  Because their possible side effects are still unknown, tanning beds are not recommended.  Ultrasound Scans:  Routine ultrasounds are performed at approximately 20 weeks.  You will be able to see your baby's general anatomy an if you would like to know the gender this can usually be determined as well.  If it is questionable when you conceived you may also receive an ultrasound early in your pregnancy for dating purposes.  Otherwise ultrasound exams are not routinely performed unless there is a medical necessity.  Although you can request a scan we ask that you pay for it when conducted because insurance does not cover " patient request" scans.  Work: If your  pregnancy proceeds without complications you may work until your due date, unless your physician or employer advises otherwise.  Round Ligament Pain/Pelvic Discomfort:  Sharp, shooting pains not associated with bleeding are fairly common, usually occurring in the second trimester of pregnancy.  They tend to be worse when standing up or when you remain standing for long periods of time.  These are the result of pressure of certain pelvic ligaments called "round ligaments".  Rest, Tylenol and heat seem to be the most effective relief.  As the womb and fetus grow, they rise out of the pelvis and the discomfort improves.  Please notify the office if your pain seems different than that described.  It may represent a more serious condition.  Common Medications Safe in Pregnancy  Acne:      Constipation:  Benzoyl Peroxide     Colace  Clindamycin      Dulcolax Suppository  Topica Erythromycin     Fibercon  Salicylic Acid      Metamucil         Miralax AVOID:        Senakot   Accutane    Cough:  Retin-A       Cough Drops  Tetracycline      Phenergan w/ Codeine if Rx  Minocycline      Robitussin (Plain & DM)  Antibiotics:     Crabs/Lice:  Ceclor       RID  Cephalosporins    AVOID:  E-Mycins      Kwell  Keflex  Macrobid/Macrodantin   Diarrhea:  Penicillin      Kao-Pectate  Zithromax      Imodium AD         PUSH FLUIDS AVOID:       Cipro     Fever:  Tetracycline      Tylenol (Regular or Extra  Minocycline       Strength)  Levaquin      Extra Strength-Do not          Exceed 8 tabs/24 hrs Caffeine:        <  $'200mg'R$ /day (equiv. To 1 cup of coffee or  approx. 3 12 oz sodas)         Gas: Cold/Hayfever:       Gas-X  Benadryl      Mylicon  Claritin       Phazyme  **Claritin-D        Chlor-Trimeton    Headaches:  Dimetapp      ASA-Free Excedrin  Drixoral-Non-Drowsy     Cold Compress  Mucinex (Guaifenasin)     Tylenol (Regular or Extra  Sudafed/Sudafed-12 Hour     Strength)  **Sudafed PE  Pseudoephedrine   Tylenol Cold & Sinus     Vicks Vapor Rub  Zyrtec  **AVOID if Problems With Blood Pressure         Heartburn: Avoid lying down for at least 1 hour after meals  Aciphex      Maalox     Rash:  Milk of Magnesia     Benadryl    Mylanta       1% Hydrocortisone Cream  Pepcid  Pepcid Complete   Sleep Aids:  Prevacid      Ambien   Prilosec       Benadryl  Rolaids       Chamomile Tea  Tums (Limit 4/day)     Unisom         Tylenol PM         Warm milk-add vanilla or  Hemorrhoids:       Sugar for taste  Anusol/Anusol H.C.  (RX: Analapram 2.5%)  Sugar Substitutes:  Hydrocortisone OTC     Ok in moderation  Preparation H      Tucks        Vaseline lotion applied to tissue with wiping    Herpes:     Throat:  Acyclovir      Oragel  Famvir  Valtrex     Vaccines:         Flu Shot Leg Cramps:       *Gardasil  Benadryl      Hepatitis A         Hepatitis B Nasal Spray:       Pneumovax  Saline Nasal Spray     Polio Booster         Tetanus Nausea:       Tuberculosis test or PPD  Vitamin B6 25 mg TID   AVOID:    Dramamine      *Gardasil  Emetrol       Live Poliovirus  Ginger Root 250 mg QID    MMR (measles, mumps &  High Complex Carbs @ Bedtime    rebella)  Sea Bands-Accupressure    Varicella (Chickenpox)  Unisom 1/2 tab TID     *No known complications           If received before Pain:         Known pregnancy;   Darvocet       Resume series after  Lortab        Delivery  Percocet    Yeast:   Tramadol      Femstat  Tylenol 3      Gyne-lotrimin  Ultram       Monistat  Vicodin           MISC:         All Sunscreens           Hair Coloring/highlights          Insect  Repellant's          (Including DEET)         Mystic Tans

## 2020-04-04 NOTE — Progress Notes (Signed)
      Janet Coleman presents for NOB nurse intake visit. Pregnancy confirmation done at [facility], 03/12/2020, with Linzie Collin, MD.  G6.  [phone].  LMP 02/02/2020.  EDD 11/08/2020.  Ga [redacted]w[redacted]d. Pregnancy education material explained and given.  1 cats in the home.  NOB labs ordered. BMI less than 30. TSH/HbgA1c not ordered. Sickle cell not ordered due to race. HIV and drug screen explained and ordered. Genetic screening discussed. Genetic testing; Unsure. Pt to discuss genetic testing with provider. PNV encouraged. Pt to follow up with provider in 4 weeks for NOB physical.  FMLA, HIV/Drug Screening Consent and [facility] Financial Policy signed and reviewed with pt.

## 2020-04-05 LAB — HIV ANTIBODY (ROUTINE TESTING W REFLEX): HIV Screen 4th Generation wRfx: NONREACTIVE

## 2020-04-05 LAB — URINALYSIS, ROUTINE W REFLEX MICROSCOPIC
Bilirubin, UA: NEGATIVE
Glucose, UA: NEGATIVE
Ketones, UA: NEGATIVE
Leukocytes,UA: NEGATIVE
Nitrite, UA: NEGATIVE
RBC, UA: NEGATIVE
Specific Gravity, UA: 1.028 (ref 1.005–1.030)
Urobilinogen, Ur: 0.2 mg/dL (ref 0.2–1.0)
pH, UA: 6.5 (ref 5.0–7.5)

## 2020-04-05 LAB — TOXOPLASMA ANTIBODIES- IGG AND  IGM
Toxoplasma Antibody- IgM: <3 AU/mL (ref 0.0–7.9)
Toxoplasma IgG Ratio: <3 IU/mL (ref 0.0–7.1)

## 2020-04-05 LAB — ANTIBODY SCREEN: Antibody Screen: NEGATIVE

## 2020-04-05 LAB — VARICELLA ZOSTER ANTIBODY, IGG: Varicella zoster IgG: >4000 index (ref >165)

## 2020-04-05 LAB — RUBELLA SCREEN: Rubella Antibodies, IGG: 5.94 index (ref >0.99)

## 2020-04-05 LAB — HEPATITIS B SURFACE ANTIGEN: Hepatitis B Surface Ag: NEGATIVE

## 2020-04-05 LAB — RPR: RPR Ser Ql: NONREACTIVE

## 2020-04-06 LAB — DRUG PROFILE, UR, 9 DRUGS (LABCORP)
Amphetamines, Urine: NEGATIVE ng/mL
Barbiturate Quant, Ur: NEGATIVE ng/mL
Benzodiazepine Quant, Ur: NEGATIVE ng/mL
Cannabinoid Quant, Ur: NEGATIVE ng/mL
Cocaine (Metab.): NEGATIVE ng/mL
Methadone Screen, Urine: NEGATIVE ng/mL
Opiate Quant, Ur: NEGATIVE ng/mL
PCP Quant, Ur: NEGATIVE ng/mL
Propoxyphene: NEGATIVE ng/mL

## 2020-04-06 LAB — NICOTINE SCREEN, URINE: Cotinine Ql Scrn, Ur: POSITIVE ng/mL — AB

## 2020-04-06 LAB — GC/CHLAMYDIA PROBE AMP
Chlamydia trachomatis, NAA: NEGATIVE
Neisseria Gonorrhoeae by PCR: NEGATIVE

## 2020-04-06 LAB — CULTURE, OB URINE

## 2020-04-06 LAB — URINE CULTURE, OB REFLEX

## 2020-04-24 ENCOUNTER — Other Ambulatory Visit (HOSPITAL_COMMUNITY)
Admission: RE | Admit: 2020-04-24 | Discharge: 2020-04-24 | Disposition: A | Discharge status: Home / Self Care | Payer: Medicaid Other | Source: Ambulatory Visit | Attending: Obstetrics and Gynecology | Admitting: Obstetrics and Gynecology

## 2020-04-24 ENCOUNTER — Encounter: Payer: Self-pay | Admitting: Obstetrics and Gynecology

## 2020-04-24 ENCOUNTER — Ambulatory Visit (INDEPENDENT_AMBULATORY_CARE_PROVIDER_SITE_OTHER): Payer: Medicaid Other | Admitting: Obstetrics and Gynecology

## 2020-04-24 ENCOUNTER — Other Ambulatory Visit: Payer: Self-pay

## 2020-04-24 VITALS — BP 130/85 | HR 90 | Wt 168.0 lb

## 2020-04-24 DIAGNOSIS — Z3A11 11 weeks gestation of pregnancy: Secondary | ICD-10-CM

## 2020-04-24 DIAGNOSIS — Z3481 Encounter for supervision of other normal pregnancy, first trimester: Secondary | ICD-10-CM | POA: Diagnosis not present

## 2020-04-24 DIAGNOSIS — Z124 Encounter for screening for malignant neoplasm of cervix: Secondary | ICD-10-CM

## 2020-04-24 LAB — POCT URINALYSIS DIPSTICK OB
Bilirubin, UA: NEGATIVE
Blood, UA: NEGATIVE
Glucose, UA: NEGATIVE
Ketones, UA: NEGATIVE
Leukocytes, UA: NEGATIVE
Nitrite, UA: NEGATIVE
POC,PROTEIN,UA: NEGATIVE
Spec Grav, UA: 1.02 (ref 1.010–1.025)
Urobilinogen, UA: 0.2 E.U./dL
pH, UA: 7 (ref 5.0–8.0)

## 2020-04-24 NOTE — Addendum Note (Signed)
Addended by: Dorian Pod on: 04/24/2020 02:52 PM   Modules accepted: Orders

## 2020-04-24 NOTE — Progress Notes (Signed)
NOB: Patient doing well without nausea or vomiting.  Has a small amount of occasional brown discharge.  Taking vitamins as directed.  Desires genetic testing today.  aFP next visit.  Physical examination General NAD, Conversant  HEENT Atraumatic; Op clear with mmm.  Normo-cephalic. Pupils reactive. Anicteric sclerae  Thyroid/Neck Smooth without nodularity or enlargement. Normal ROM.  Neck Supple.  Skin No rashes, lesions or ulceration. Normal palpated skin turgor. No nodularity.  Breasts: No masses or discharge.  Symmetric.  No axillary adenopathy.  Lungs: Clear to auscultation.No rales or wheezes. Normal Respiratory effort, no retractions.  Heart: NSR.  No murmurs or rubs appreciated. No periferal edema  Abdomen: Soft.  Non-tender.  No masses.  No HSM. No hernia  Extremities: Moves all appropriately.  Normal ROM for age. No lymphadenopathy.  Neuro: Oriented to PPT.  Normal mood. Normal affect.     Pelvic:   Vulva: Normal appearance.  No lesions.  Vagina: No lesions or abnormalities noted.  Support: Normal pelvic support.  Urethra No masses tenderness or scarring.  Meatus Normal size without lesions or prolapse.  Cervix: Normal appearance.  No lesions.  Anus: Normal exam.  No lesions.  Perineum: Normal exam.  No lesions.        Bimanual   Adnexae: No masses.  Non-tender to palpation.  Uterus: Enlarged. 11 wks   Non-tender.  Mobile.  AV.  Adnexae: No masses.  Non-tender to palpation.  Cul-de-sac: Negative for abnormality.  Adnexae: No masses.  Non-tender to palpation.         Pelvimetry   Diagonal: Reached.  Spines: Average.  Sacrum: Concave.  Pubic Arch: Normal.    Pap performed

## 2020-04-26 LAB — CYTOLOGY - PAP
Comment: NEGATIVE
Diagnosis: NEGATIVE
High risk HPV: NEGATIVE

## 2020-04-30 LAB — MATERNIT21  PLUS CORE+ESS+SCA, BLOOD
11q23 deletion (Jacobsen): NOT DETECTED
15q11 deletion (PW Angelman): NOT DETECTED
1p36 deletion syndrome: NOT DETECTED
22q11 deletion (DiGeorge): NOT DETECTED
4p16 deletion(Wolf-Hirschhorn): NOT DETECTED
5p15 deletion (Cri-du-chat): NOT DETECTED
8q24 deletion (Langer-Giedion): NOT DETECTED
Fetal Fraction: 12
Monosomy X (Turner Syndrome): NOT DETECTED
Result (T21): NEGATIVE
Trisomy 13 (Patau syndrome): NEGATIVE
Trisomy 16: NOT DETECTED
Trisomy 18 (Edwards syndrome): NEGATIVE
Trisomy 21 (Down syndrome): NEGATIVE
Trisomy 22: NOT DETECTED
XXX (Triple X Syndrome): NOT DETECTED
XXY (Klinefelter Syndrome): NOT DETECTED
XYY (Jacobs Syndrome): NOT DETECTED

## 2020-05-10 ENCOUNTER — Other Ambulatory Visit: Payer: Self-pay | Admitting: Surgical

## 2020-05-22 ENCOUNTER — Encounter: Payer: Self-pay | Admitting: Obstetrics and Gynecology

## 2020-05-22 ENCOUNTER — Other Ambulatory Visit: Payer: Self-pay

## 2020-05-22 ENCOUNTER — Ambulatory Visit (INDEPENDENT_AMBULATORY_CARE_PROVIDER_SITE_OTHER): Payer: Medicaid Other | Admitting: Obstetrics and Gynecology

## 2020-05-22 VITALS — BP 132/77 | HR 92 | Wt 166.2 lb

## 2020-05-22 DIAGNOSIS — Z3A15 15 weeks gestation of pregnancy: Secondary | ICD-10-CM

## 2020-05-22 DIAGNOSIS — O09522 Supervision of elderly multigravida, second trimester: Secondary | ICD-10-CM | POA: Insufficient documentation

## 2020-05-22 DIAGNOSIS — Z1379 Encounter for other screening for genetic and chromosomal anomalies: Secondary | ICD-10-CM

## 2020-05-22 LAB — POCT URINALYSIS DIPSTICK OB
Bilirubin, UA: NEGATIVE
Blood, UA: NEGATIVE
Glucose, UA: NEGATIVE
Ketones, UA: NEGATIVE
Leukocytes, UA: NEGATIVE
Nitrite, UA: NEGATIVE
POC,PROTEIN,UA: NEGATIVE
Spec Grav, UA: 1.01
Urobilinogen, UA: 0.2 U/dL
pH, UA: 7

## 2020-05-22 NOTE — Progress Notes (Signed)
ROB: Patient notes receiving 1st injection for COVID which made her sick for 21 days. Will hold off until 3rd trimester for second dose. Declines flu vaccine. Normal MaterniT21, for AFP today.  RTC in 4 weeks, for anatomy scan then.

## 2020-05-22 NOTE — Progress Notes (Signed)
ROB-Pt present for routine prenatal care. Pt stated that she was doing well.  

## 2020-05-25 LAB — AFP, SERUM, OPEN SPINA BIFIDA
AFP MoM: 1.44
AFP Value: 43.6 ng/mL
Gest. Age on Collection Date: 15.7 weeks
Maternal Age At EDD: 35 yr
OSBR Risk 1 IN: 3220
Test Results:: NEGATIVE
Weight: 166 [lb_av]

## 2020-06-11 ENCOUNTER — Telehealth: Payer: Self-pay

## 2020-06-26 ENCOUNTER — Other Ambulatory Visit: Payer: Medicaid Other

## 2020-06-26 ENCOUNTER — Encounter: Payer: Medicaid Other | Admitting: Obstetrics and Gynecology

## 2020-06-27 ENCOUNTER — Telehealth: Payer: Self-pay

## 2020-06-27 ENCOUNTER — Encounter: Payer: Medicaid Other | Admitting: Obstetrics and Gynecology

## 2020-06-27 ENCOUNTER — Other Ambulatory Visit: Payer: Medicaid Other

## 2020-06-29 ENCOUNTER — Encounter: Payer: Self-pay | Admitting: Emergency Medicine

## 2020-06-29 DIAGNOSIS — R55 Syncope and collapse: Secondary | ICD-10-CM | POA: Insufficient documentation

## 2020-06-29 DIAGNOSIS — R479 Unspecified speech disturbances: Secondary | ICD-10-CM | POA: Insufficient documentation

## 2020-06-29 DIAGNOSIS — T40411A Poisoning by fentanyl or fentanyl analogs, accidental (unintentional), initial encounter: Secondary | ICD-10-CM | POA: Insufficient documentation

## 2020-06-29 DIAGNOSIS — Z5321 Procedure and treatment not carried out due to patient leaving prior to being seen by health care provider: Secondary | ICD-10-CM | POA: Insufficient documentation

## 2020-07-04 ENCOUNTER — Other Ambulatory Visit: Payer: Self-pay

## 2020-07-04 ENCOUNTER — Ambulatory Visit (INDEPENDENT_AMBULATORY_CARE_PROVIDER_SITE_OTHER): Payer: Medicaid Other

## 2020-07-04 ENCOUNTER — Ambulatory Visit (INDEPENDENT_AMBULATORY_CARE_PROVIDER_SITE_OTHER): Payer: Medicaid Other | Admitting: Obstetrics and Gynecology

## 2020-07-04 VITALS — BP 133/82 | HR 80 | Wt 168.5 lb

## 2020-07-04 DIAGNOSIS — O09522 Supervision of elderly multigravida, second trimester: Secondary | ICD-10-CM

## 2020-07-04 DIAGNOSIS — Z3A21 21 weeks gestation of pregnancy: Secondary | ICD-10-CM | POA: Diagnosis not present

## 2020-07-04 DIAGNOSIS — K219 Gastro-esophageal reflux disease without esophagitis: Secondary | ICD-10-CM

## 2020-07-04 LAB — POCT URINALYSIS DIPSTICK OB
Bilirubin, UA: NEGATIVE
Blood, UA: NEGATIVE
Glucose, UA: NEGATIVE
Leukocytes, UA: NEGATIVE
Nitrite, UA: NEGATIVE
Spec Grav, UA: 1.015 (ref 1.010–1.025)
Urobilinogen, UA: 0.2 E.U./dL
pH, UA: 6.5 (ref 5.0–8.0)

## 2020-07-04 MED ORDER — DOXYLAMINE-PYRIDOXINE 10-10 MG PO TBEC: 2.0000 | DELAYED_RELEASE_TABLET | Freq: Every day | ORAL | 5 refills | Status: DC

## 2020-07-04 MED ORDER — PANTOPRAZOLE SODIUM 20 MG PO TBEC: 20.0000 mg | DELAYED_RELEASE_TABLET | Freq: Two times a day (BID) | ORAL | 1 refills | Status: DC

## 2020-07-04 NOTE — Progress Notes (Signed)
ROB: Complains of acid reflux-heartburn has tried over-the-counter medications without success. Rxed.  FAS today.

## 2020-07-27 NOTE — L&D Delivery Note (Signed)
       Delivery Note   Janet Coleman is a 35 y.o. [id number] at [redacted]w[redacted]d Estimated Date of Delivery: 11/08/20  PRE-OPERATIVE DIAGNOSIS:  1) [redacted]w[redacted]d pregnancy.  2) Labor  POST-OPERATIVE DIAGNOSIS:  1) [redacted]w[redacted]d pregnancy s/p Vaginal, Spontaneous  2) Viable infant  Delivery Type: Vaginal, Spontaneous    Delivery Anesthesia: Epidural   Labor Complications:     ESTIMATED BLOOD LOSS: 100  ml    FINDINGS:   1) female infant, Apgar scores of    at 1 minute and    at 5 minutes and a birthweight of   ounces.    2) Nuchal cord: Yes x1  SPECIMENS:   PLACENTA:   Appearance: Intact    Removal: Spontaneous      Disposition:    DISPOSITION:  Infant to left in stable condition in the delivery room, with L&D personnel and mother,  NARRATIVE SUMMARY: Labor course:  Janet Coleman is a [id number] at [redacted]w[redacted]d who presented for labor management.  She progressed well in labor with pitocin.  She received the appropriate anesthesia and proceeded to complete dilation. She evidenced good maternal expulsive effort during the second stage. She went on to deliver a viable infant. A nuchal cord was reduced on the perineum. The placenta delivered without problems and was noted to be complete. A perineal and vaginal examination was performed. Episiotomy/Lacerations: None   Elonda Husky, M.D. 11/08/2020 11:54 PM

## 2020-07-29 ENCOUNTER — Encounter: Payer: Medicaid Other | Admitting: Obstetrics and Gynecology

## 2020-07-30 ENCOUNTER — Encounter: Payer: Medicaid Other | Admitting: Obstetrics and Gynecology

## 2020-08-01 ENCOUNTER — Ambulatory Visit (INDEPENDENT_AMBULATORY_CARE_PROVIDER_SITE_OTHER): Payer: Medicaid Other | Admitting: Obstetrics and Gynecology

## 2020-08-01 ENCOUNTER — Other Ambulatory Visit: Payer: Self-pay

## 2020-08-01 ENCOUNTER — Encounter: Payer: Self-pay | Admitting: Obstetrics and Gynecology

## 2020-08-01 VITALS — BP 121/74 | HR 69 | Wt 171.4 lb

## 2020-08-01 DIAGNOSIS — O09522 Supervision of elderly multigravida, second trimester: Secondary | ICD-10-CM

## 2020-08-01 DIAGNOSIS — Z9189 Other specified personal risk factors, not elsewhere classified: Secondary | ICD-10-CM

## 2020-08-01 DIAGNOSIS — Z3A25 25 weeks gestation of pregnancy: Secondary | ICD-10-CM

## 2020-08-01 LAB — POCT URINALYSIS DIPSTICK OB
Bilirubin, UA: NEGATIVE
Blood, UA: NEGATIVE
Glucose, UA: NEGATIVE
Ketones, UA: NEGATIVE
Leukocytes, UA: NEGATIVE
Nitrite, UA: NEGATIVE
POC,PROTEIN,UA: NEGATIVE
Spec Grav, UA: 1.01 (ref 1.010–1.025)
Urobilinogen, UA: 0.2 E.U./dL
pH, UA: 7.5 (ref 5.0–8.0)

## 2020-08-01 NOTE — Progress Notes (Signed)
ROB-Pt present for routine prenatal care. Pt stated that she was having pelvic and anal pain and pressure.

## 2020-08-01 NOTE — Patient Instructions (Signed)

## 2020-08-01 NOTE — Progress Notes (Signed)
ROB: Patient notes hip pain and back pain, mostly after working and standing. She is working night shift. Also discussed importance of supportive shoes. Discussed use of pregnancy girdle. Desires to breastfeed.  Desires unsure method for contraception (considering BTL vs LARC). For 28 week labs next visit, however patient notes she cannot tolerate glucola test due to her reactive hypoglycemia. Declines testing.  Will order remaining 28 week labs. Visited by Child psychotherapist during today's visit. Patient for repeat UDS next visit (h/o fentanyl overdose in ER on 12/4, however patient denies use).   The following were addressed during this visit:  Breastfeeding Education - Nonpharmacological pain relief methods for labor    Comments: Deep breathing, focusing on pleasant things, movement and walking, heating pads or cold compress, massage and relaxation, continuous support from someone you trust, and Doulas   - The importance of early skin-to-skin contact    Comments: Keeps baby warm and secure, helps keep baby's blood sugar up and breathing steady, easier to bond and breastfeed, and helps calm baby.  - Rooming-in on a 24-hour basis    Comments: Easier to learn baby's feeding cues, easier to bond and get to know each other, and encourages milk production.   - Effective positioning and attachment    Comments: Helps my baby to get enough breast milk, helps to produce an adequate milk supply, and helps prevent nipple pain and damage   - Exclusive breastfeeding for the first 6 months    Comments: Builds a healthy milk supply and keeps it up, protects baby from sickness and disease, and breastmilk has everything your baby needs for the first 6 months.

## 2020-08-14 DIAGNOSIS — M545 Low back pain, unspecified: Secondary | ICD-10-CM | POA: Diagnosis not present

## 2020-08-14 DIAGNOSIS — S39012A Strain of muscle, fascia and tendon of lower back, initial encounter: Secondary | ICD-10-CM | POA: Diagnosis not present

## 2020-08-21 DIAGNOSIS — Z3493 Encounter for supervision of normal pregnancy, unspecified, third trimester: Secondary | ICD-10-CM | POA: Diagnosis not present

## 2020-08-21 DIAGNOSIS — M62830 Muscle spasm of back: Secondary | ICD-10-CM | POA: Diagnosis not present

## 2020-08-21 DIAGNOSIS — M5432 Sciatica, left side: Secondary | ICD-10-CM | POA: Diagnosis not present

## 2020-08-27 NOTE — Telephone Encounter (Signed)
error 

## 2020-08-27 NOTE — Telephone Encounter (Signed)
Error

## 2020-08-29 ENCOUNTER — Other Ambulatory Visit: Payer: Medicaid Other

## 2020-08-29 ENCOUNTER — Encounter: Payer: Self-pay | Admitting: Obstetrics and Gynecology

## 2020-08-29 ENCOUNTER — Other Ambulatory Visit: Payer: Self-pay

## 2020-08-29 ENCOUNTER — Ambulatory Visit (INDEPENDENT_AMBULATORY_CARE_PROVIDER_SITE_OTHER): Payer: Medicaid Other | Admitting: Obstetrics and Gynecology

## 2020-08-29 VITALS — BP 133/77 | HR 83 | Wt 177.0 lb

## 2020-08-29 DIAGNOSIS — Z23 Encounter for immunization: Secondary | ICD-10-CM

## 2020-08-29 DIAGNOSIS — O09522 Supervision of elderly multigravida, second trimester: Secondary | ICD-10-CM | POA: Diagnosis not present

## 2020-08-29 DIAGNOSIS — O09523 Supervision of elderly multigravida, third trimester: Secondary | ICD-10-CM

## 2020-08-29 DIAGNOSIS — Z9189 Other specified personal risk factors, not elsewhere classified: Secondary | ICD-10-CM

## 2020-08-29 DIAGNOSIS — Z3A29 29 weeks gestation of pregnancy: Secondary | ICD-10-CM

## 2020-08-29 LAB — POCT URINALYSIS DIPSTICK OB
Appearance: NORMAL
Bilirubin, UA: NEGATIVE
Blood, UA: NEGATIVE
Glucose: NEGATIVE
Ketones, UA: 5
Leukocytes, UA: NEGATIVE
Nitrite, UA: NEGATIVE
Odor: NORMAL
POC,PROTEIN,UA: NEGATIVE
Spec Grav, UA: 1.01 (ref 1.010–1.025)
Urobilinogen, UA: 0.2 E.U./dL
pH, UA: 7 (ref 5.0–8.0)

## 2020-08-29 NOTE — Progress Notes (Signed)
ROB: She states this pregnancy is harder than her previous pregnancies but she says she is doing well today.  UDS today.  Patient says she takes her sugars 4 times a day because of hypoglycemia.  She says the highest she ever gets is 120.  I have recommended hemoglobin A1c with next visit and the patient has agreed.  Patient had several days of severe back pain and took time off of work but she says she is now improving.

## 2020-08-30 LAB — DRUG PROFILE, UR, 9 DRUGS (LABCORP)
Amphetamines, Urine: NEGATIVE ng/mL
Barbiturate Quant, Ur: NEGATIVE ng/mL
Benzodiazepine Quant, Ur: NEGATIVE ng/mL
Cannabinoid Quant, Ur: NEGATIVE ng/mL
Cocaine (Metab.): NEGATIVE ng/mL
Methadone Screen, Urine: NEGATIVE ng/mL
Opiate Quant, Ur: NEGATIVE ng/mL
PCP Quant, Ur: NEGATIVE ng/mL
Propoxyphene: NEGATIVE ng/mL

## 2020-08-30 LAB — CBC
Hematocrit: 33.7 % — ABNORMAL LOW (ref 34.0–46.6)
Hemoglobin: 11.5 g/dL (ref 11.1–15.9)
MCH: 34 pg — ABNORMAL HIGH (ref 26.6–33.0)
MCHC: 34.1 g/dL (ref 31.5–35.7)
MCV: 100 fL — ABNORMAL HIGH (ref 79–97)
Platelets: 173 10*3/uL (ref 150–450)
RBC: 3.38 x10E6/uL — ABNORMAL LOW (ref 3.77–5.28)
RDW: 12.7 % (ref 11.7–15.4)
WBC: 8.9 10*3/uL (ref 3.4–10.8)

## 2020-08-30 LAB — HEPATITIS C ANTIBODY: Hep C Virus Ab: <0.1 s/co ratio (ref 0.0–0.9)

## 2020-08-30 LAB — RPR: RPR Ser Ql: NONREACTIVE

## 2020-09-11 NOTE — Patient Instructions (Signed)
WHAT OB PATIENTS CAN EXPECT   Confirmation of pregnancy and ultrasound ordered if medically indicated-[redacted] weeks gestation  New OB (NOB) intake with nurse and New OB (NOB) labs- [redacted] weeks gestation  New OB (NOB) physical examination with provider- 11/[redacted] weeks gestation  Flu vaccine-[redacted] weeks gestation  Anatomy scan-[redacted] weeks gestation  Glucose tolerance test, blood work to test for anemia, T-dap vaccine-[redacted] weeks gestation  Vaginal swabs/cultures-STD/Group B strep-[redacted] weeks gestation  Appointments every 4 weeks until 28 weeks  Every 2 weeks from 28 weeks until 36 weeks  Weekly visits from 36 weeks until delivery  Common Medications Safe in Pregnancy  Acne:      Constipation:  Benzoyl Peroxide     Colace  Clindamycin      Dulcolax Suppository  Topica Erythromycin     Fibercon  Salicylic Acid      Metamucil         Miralax AVOID:        Senakot   Accutane    Cough:  Retin-A       Cough Drops  Tetracycline      Phenergan w/ Codeine if Rx  Minocycline      Robitussin (Plain & DM)  Antibiotics:     Crabs/Lice:  Ceclor       RID  Cephalosporins    AVOID:  E-Mycins      Kwell  Keflex  Macrobid/Macrodantin   Diarrhea:  Penicillin      Kao-Pectate  Zithromax      Imodium AD         PUSH FLUIDS AVOID:       Cipro     Fever:  Tetracycline      Tylenol (Regular or Extra  Minocycline       Strength)  Levaquin      Extra Strength-Do not          Exceed 8 tabs/24 hrs Caffeine:        <256m/day (equiv. To 1 cup of coffee or  approx. 3 12 oz sodas)         Gas: Cold/Hayfever:       Gas-X  Benadryl      Mylicon  Claritin       Phazyme  **Claritin-D        Chlor-Trimeton    Headaches:  Dimetapp      ASA-Free Excedrin  Drixoral-Non-Drowsy     Cold Compress  Mucinex (Guaifenasin)     Tylenol (Regular or Extra  Sudafed/Sudafed-12 Hour     Strength)  **Sudafed PE Pseudoephedrine   Tylenol Cold & Sinus     Vicks Vapor Rub  Zyrtec  **AVOID if Problems With Blood  Pressure         Heartburn: Avoid lying down for at least 1 hour after meals  Aciphex      Maalox     Rash:  Milk of Magnesia     Benadryl    Mylanta       1% Hydrocortisone Cream  Pepcid  Pepcid Complete   Sleep Aids:  Prevacid      Ambien   Prilosec       Benadryl  Rolaids       Chamomile Tea  Tums (Limit 4/day)     Unisom         Tylenol PM         Warm milk-add vanilla or  Hemorrhoids:       Sugar for taste  Anusol/Anusol H.C.  (RX: Analapram 2.5%)  Sugar Substitutes:  Hydrocortisone OTC     Ok in moderation  Preparation H      Tucks        Vaseline lotion applied to tissue with wiping    Herpes:     Throat:  Acyclovir      Oragel  Famvir  Valtrex     Vaccines:         Flu Shot Leg Cramps:       *Gardasil  Benadryl      Hepatitis A         Hepatitis B Nasal Spray:       Pneumovax  Saline Nasal Spray     Polio Booster         Tetanus Nausea:       Tuberculosis test or PPD  Vitamin B6 25 mg TID   AVOID:    Dramamine      *Gardasil  Emetrol       Live Poliovirus  Ginger Root 250 mg QID    MMR (measles, mumps &  High Complex Carbs @ Bedtime    rebella)  Sea Bands-Accupressure    Varicella (Chickenpox)  Unisom 1/2 tab TID     *No known complications           If received before Pain:         Known pregnancy;   Darvocet       Resume series after  Lortab        Delivery  Percocet    Yeast:   Tramadol      Femstat  Tylenol 3      Gyne-lotrimin  Ultram       Monistat  Vicodin           MISC:         All Sunscreens           Hair Coloring/highlights          Insect Repellant's          (Including DEET)         Mystic Tans Breastfeeding  Choosing to breastfeed is one of the best decisions you can make for yourself and your baby. A change in hormones during pregnancy causes your breasts to make breast milk in your milk-producing glands. Hormones prevent breast milk from being released before your baby is born. They also prompt milk flow after birth. Once  breastfeeding has begun, thoughts of your baby, as well as his or her sucking or crying, can stimulate the release of milk from your milk-producing glands. Benefits of breastfeeding Research shows that breastfeeding offers many health benefits for infants and mothers. It also offers a cost-free and convenient way to feed your baby. For your baby  Your first milk (colostrum) helps your baby's digestive system to function better.  Special cells in your milk (antibodies) help your baby to fight off infections.  Breastfed babies are less likely to develop asthma, allergies, obesity, or type 2 diabetes. They are also at lower risk for sudden infant death syndrome (SIDS).  Nutrients in breast milk are better able to meet your baby's needs compared to infant formula.  Breast milk improves your baby's brain development. For you  Breastfeeding helps to create a very special bond between you and your baby.  Breastfeeding is convenient. Breast milk costs nothing and is always available at the correct temperature.  Breastfeeding helps to burn calories. It helps you to lose the weight that you gained during pregnancy.  Breastfeeding  makes your uterus return faster to its size before pregnancy. It also slows bleeding (lochia) after you give birth.  Breastfeeding helps to lower your risk of developing type 2 diabetes, osteoporosis, rheumatoid arthritis, cardiovascular disease, and breast, ovarian, uterine, and endometrial cancer later in life. Breastfeeding basics Starting breastfeeding  Find a comfortable place to sit or lie down, with your neck and back well-supported.  Place a pillow or a rolled-up blanket under your baby to bring him or her to the level of your breast (if you are seated). Nursing pillows are specially designed to help support your arms and your baby while you breastfeed.  Make sure that your baby's tummy (abdomen) is facing your abdomen.  Gently massage your breast. With your  fingertips, massage from the outer edges of your breast inward toward the nipple. This encourages milk flow. If your milk flows slowly, you may need to continue this action during the feeding.  Support your breast with 4 fingers underneath and your thumb above your nipple (make the letter "C" with your hand). Make sure your fingers are well away from your nipple and your baby's mouth.  Stroke your baby's lips gently with your finger or nipple.  When your baby's mouth is open wide enough, quickly bring your baby to your breast, placing your entire nipple and as much of the areola as possible into your baby's mouth. The areola is the colored area around your nipple. ? More areola should be visible above your baby's upper lip than below the lower lip. ? Your baby's lips should be opened and extended outward (flanged) to ensure an adequate, comfortable latch. ? Your baby's tongue should be between his or her lower gum and your breast.  Make sure that your baby's mouth is correctly positioned around your nipple (latched). Your baby's lips should create a seal on your breast and be turned out (everted).  It is common for your baby to suck about 2-3 minutes in order to start the flow of breast milk. Latching Teaching your baby how to latch onto your breast properly is very important. An improper latch can cause nipple pain, decreased milk supply, and poor weight gain in your baby. Also, if your baby is not latched onto your nipple properly, he or she may swallow some air during feeding. This can make your baby fussy. Burping your baby when you switch breasts during the feeding can help to get rid of the air. However, teaching your baby to latch on properly is still the best way to prevent fussiness from swallowing air while breastfeeding. Signs that your baby has successfully latched onto your nipple  Silent tugging or silent sucking, without causing you pain. Infant's lips should be extended outward  (flanged).  Swallowing heard between every 3-4 sucks once your milk has started to flow (after your let-down milk reflex occurs).  Muscle movement above and in front of his or her ears while sucking. Signs that your baby has not successfully latched onto your nipple  Sucking sounds or smacking sounds from your baby while breastfeeding.  Nipple pain. If you think your baby has not latched on correctly, slip your finger into the corner of your baby's mouth to break the suction and place it between your baby's gums. Attempt to start breastfeeding again. Signs of successful breastfeeding Signs from your baby  Your baby will gradually decrease the number of sucks or will completely stop sucking.  Your baby will fall asleep.  Your baby's body will relax.  Your baby will retain a small amount of milk in his or her mouth.  Your baby will let go of your breast by himself or herself. Signs from you  Breasts that have increased in firmness, weight, and size 1-3 hours after feeding.  Breasts that are softer immediately after breastfeeding.  Increased milk volume, as well as a change in milk consistency and color by the fifth day of breastfeeding.  Nipples that are not sore, cracked, or bleeding. Signs that your baby is getting enough milk  Wetting at least 1-2 diapers during the first 24 hours after birth.  Wetting at least 5-6 diapers every 24 hours for the first week after birth. The urine should be clear or pale yellow by the age of 5 days.  Wetting 6-8 diapers every 24 hours as your baby continues to grow and develop.  At least 3 stools in a 24-hour period by the age of 5 days. The stool should be soft and yellow.  At least 3 stools in a 24-hour period by the age of 7 days. The stool should be seedy and yellow.  No loss of weight greater than 10% of birth weight during the first 3 days of life.  Average weight gain of 4-7 oz (113-198 g) per week after the age of 4  days.  Consistent daily weight gain by the age of 5 days, without weight loss after the age of 2 weeks. After a feeding, your baby may spit up a small amount of milk. This is normal. Breastfeeding frequency and duration Frequent feeding will help you make more milk and can prevent sore nipples and extremely full breasts (breast engorgement). Breastfeed when you feel the need to reduce the fullness of your breasts or when your baby shows signs of hunger. This is called "breastfeeding on demand." Signs that your baby is hungry include:  Increased alertness, activity, or restlessness.  Movement of the head from side to side.  Opening of the mouth when the corner of the mouth or cheek is stroked (rooting).  Increased sucking sounds, smacking lips, cooing, sighing, or squeaking.  Hand-to-mouth movements and sucking on fingers or hands.  Fussing or crying. Avoid introducing a pacifier to your baby in the first 4-6 weeks after your baby is born. After this time, you may choose to use a pacifier. Research has shown that pacifier use during the first year of a baby's life decreases the risk of sudden infant death syndrome (SIDS). Allow your baby to feed on each breast as long as he or she wants. When your baby unlatches or falls asleep while feeding from the first breast, offer the second breast. Because newborns are often sleepy in the first few weeks of life, you may need to awaken your baby to get him or her to feed. Breastfeeding times will vary from baby to baby. However, the following rules can serve as a guide to help you make sure that your baby is properly fed:  Newborns (babies 64 weeks of age or younger) may breastfeed every 1-3 hours.  Newborns should not go without breastfeeding for longer than 3 hours during the day or 5 hours during the night.  You should breastfeed your baby a minimum of 8 times in a 24-hour period. Breast milk pumping Pumping and storing breast milk allows you to  make sure that your baby is exclusively fed your breast milk, even at times when you are unable to breastfeed. This is especially important if you go back to  work while you are still breastfeeding, or if you are not able to be present during feedings. Your lactation consultant can help you find a method of pumping that works best for you and give you guidelines about how long it is safe to store breast milk.      Caring for your breasts while you breastfeed Nipples can become dry, cracked, and sore while breastfeeding. The following recommendations can help keep your breasts moisturized and healthy:  Avoid using soap on your nipples.  Wear a supportive bra designed especially for nursing. Avoid wearing underwire-style bras or extremely tight bras (sports bras).  Air-dry your nipples for 3-4 minutes after each feeding.  Use only cotton bra pads to absorb leaked breast milk. Leaking of breast milk between feedings is normal.  Use lanolin on your nipples after breastfeeding. Lanolin helps to maintain your skin's normal moisture barrier. Pure lanolin is not harmful (not toxic) to your baby. You may also hand express a few drops of breast milk and gently massage that milk into your nipples and allow the milk to air-dry. In the first few weeks after giving birth, some women experience breast engorgement. Engorgement can make your breasts feel heavy, warm, and tender to the touch. Engorgement peaks within 3-5 days after you give birth. The following recommendations can help to ease engorgement:  Completely empty your breasts while breastfeeding or pumping. You may want to start by applying warm, moist heat (in the shower or with warm, water-soaked hand towels) just before feeding or pumping. This increases circulation and helps the milk flow. If your baby does not completely empty your breasts while breastfeeding, pump any extra milk after he or she is finished.  Apply ice packs to your breasts  immediately after breastfeeding or pumping, unless this is too uncomfortable for you. To do this: ? Put ice in a plastic bag. ? Place a towel between your skin and the bag. ? Leave the ice on for 20 minutes, 2-3 times a day.  Make sure that your baby is latched on and positioned properly while breastfeeding. If engorgement persists after 48 hours of following these recommendations, contact your health care provider or a Science writer. Overall health care recommendations while breastfeeding  Eat 3 healthy meals and 3 snacks every day. Well-nourished mothers who are breastfeeding need an additional 450-500 calories a day. You can meet this requirement by increasing the amount of a balanced diet that you eat.  Drink enough water to keep your urine pale yellow or clear.  Rest often, relax, and continue to take your prenatal vitamins to prevent fatigue, stress, and low vitamin and mineral levels in your body (nutrient deficiencies).  Do not use any products that contain nicotine or tobacco, such as cigarettes and e-cigarettes. Your baby may be harmed by chemicals from cigarettes that pass into breast milk and exposure to secondhand smoke. If you need help quitting, ask your health care provider.  Avoid alcohol.  Do not use illegal drugs or marijuana.  Talk with your health care provider before taking any medicines. These include over-the-counter and prescription medicines as well as vitamins and herbal supplements. Some medicines that may be harmful to your baby can pass through breast milk.  It is possible to become pregnant while breastfeeding. If birth control is desired, ask your health care provider about options that will be safe while breastfeeding your baby. Where to find more information: [employer]: www.llli.org Contact a health care provider if:  You feel  like you want to stop breastfeeding or have become frustrated with breastfeeding.  Your nipples are  cracked or bleeding.  Your breasts are red, tender, or warm.  You have: ? Painful breasts or nipples. ? A swollen area on either breast. ? A fever or chills. ? Nausea or vomiting. ? Drainage other than breast milk from your nipples.  Your breasts do not become full before feedings by the fifth day after you give birth.  You feel sad and depressed.  Your baby is: ? Too sleepy to eat well. ? Having trouble sleeping. ? More than 79 week old and wetting fewer than 6 diapers in a 24-hour period. ? Not gaining weight by 57 days of age.  Your baby has fewer than 3 stools in a 24-hour period.  Your baby's skin or the white parts of his or her eyes become yellow. Get help right away if:  Your baby is overly tired (lethargic) and does not want to wake up and feed.  Your baby develops an unexplained fever. Summary  Breastfeeding offers many health benefits for infant and mothers.  Try to breastfeed your infant when he or she shows early signs of hunger.  Gently tickle or stroke your baby's lips with your finger or nipple to allow the baby to open his or her mouth. Bring the baby to your breast. Make sure that much of the areola is in your baby's mouth. Offer one side and burp the baby before you offer the other side.  Talk with your health care provider or lactation consultant if you have questions or you face problems as you breastfeed. This information is not intended to replace advice given to you by your health care provider. Make sure you discuss any questions you have with your health care provider. Document Revised: 10/07/2017 Document Reviewed: 08/14/2016 Elsevier Patient Education  2021 [employer].

## 2020-09-12 ENCOUNTER — Encounter: Payer: Self-pay | Admitting: Obstetrics and Gynecology

## 2020-09-12 ENCOUNTER — Ambulatory Visit (INDEPENDENT_AMBULATORY_CARE_PROVIDER_SITE_OTHER): Payer: Medicaid Other | Admitting: Obstetrics and Gynecology

## 2020-09-12 ENCOUNTER — Other Ambulatory Visit: Payer: Self-pay

## 2020-09-12 VITALS — BP 109/68 | HR 83 | Wt 178.6 lb

## 2020-09-12 DIAGNOSIS — Z3A31 31 weeks gestation of pregnancy: Secondary | ICD-10-CM

## 2020-09-12 DIAGNOSIS — O1203 Gestational edema, third trimester: Secondary | ICD-10-CM

## 2020-09-12 DIAGNOSIS — Z0283 Encounter for blood-alcohol and blood-drug test: Secondary | ICD-10-CM

## 2020-09-12 DIAGNOSIS — O09523 Supervision of elderly multigravida, third trimester: Secondary | ICD-10-CM

## 2020-09-12 LAB — POCT URINALYSIS DIPSTICK OB
Bilirubin, UA: NEGATIVE
Blood, UA: NEGATIVE
Glucose, UA: NEGATIVE
Ketones, UA: NEGATIVE
Nitrite, UA: NEGATIVE
POC,PROTEIN,UA: NEGATIVE
Spec Grav, UA: 1.01 (ref 1.010–1.025)
Urobilinogen, UA: 0.2 E.U./dL
pH, UA: 8 (ref 5.0–8.0)
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
## 2020-09-12 NOTE — Addendum Note (Signed)
Addended by: Silvano Bilis on: 09/12/2020 04:49 PM   Modules accepted: Orders

## 2020-09-12 NOTE — Progress Notes (Signed)
OB-Pt present for routine prenatal care. Pt c/o swollen legs/feet and lower back pain.

## 2020-09-13 ENCOUNTER — Other Ambulatory Visit: Payer: Medicaid Other

## 2020-09-13 DIAGNOSIS — O09523 Supervision of elderly multigravida, third trimester: Secondary | ICD-10-CM | POA: Diagnosis not present

## 2020-09-13 DIAGNOSIS — Z0283 Encounter for blood-alcohol and blood-drug test: Secondary | ICD-10-CM | POA: Diagnosis not present

## 2020-09-14 LAB — HEMOGLOBIN A1C
Est. average glucose Bld gHb Est-mCnc: 82 mg/dL
Hgb A1c MFr Bld: 4.5 % — ABNORMAL LOW (ref 4.8–5.6)

## 2020-09-15 LAB — DRUG PROFILE, UR, 9 DRUGS (LABCORP)
Amphetamines, Urine: NEGATIVE ng/mL
Barbiturate Quant, Ur: NEGATIVE ng/mL
Benzodiazepine Quant, Ur: NEGATIVE ng/mL
Cannabinoid Quant, Ur: NEGATIVE ng/mL
Cocaine (Metab.): NEGATIVE ng/mL
Methadone Screen, Urine: NEGATIVE ng/mL
Opiate Quant, Ur: NEGATIVE ng/mL
PCP Quant, Ur: NEGATIVE ng/mL
Propoxyphene: NEGATIVE ng/mL

## 2020-09-26 ENCOUNTER — Encounter: Payer: Self-pay | Admitting: Obstetrics and Gynecology

## 2020-09-26 ENCOUNTER — Other Ambulatory Visit: Payer: Self-pay

## 2020-09-26 ENCOUNTER — Ambulatory Visit (INDEPENDENT_AMBULATORY_CARE_PROVIDER_SITE_OTHER): Payer: Medicaid Other | Admitting: Obstetrics and Gynecology

## 2020-09-26 VITALS — BP 149/85 | HR 94 | Wt 181.2 lb

## 2020-09-26 DIAGNOSIS — O09523 Supervision of elderly multigravida, third trimester: Secondary | ICD-10-CM

## 2020-09-26 DIAGNOSIS — Z3A33 33 weeks gestation of pregnancy: Secondary | ICD-10-CM

## 2020-09-26 LAB — POCT URINALYSIS DIPSTICK OB
Bilirubin, UA: NEGATIVE
Blood, UA: NEGATIVE
Glucose, UA: NEGATIVE
Ketones, UA: NEGATIVE
Nitrite, UA: NEGATIVE
Odor: NEGATIVE
POC,PROTEIN,UA: NEGATIVE
Spec Grav, UA: 1.01 (ref 1.010–1.025)
Urobilinogen, UA: 0.2 E.U./dL
pH, UA: 7.5 (ref 5.0–8.0)
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
## 2020-09-26 NOTE — Progress Notes (Signed)
Repeat pressure - 120/80. DJE aware.

## 2020-09-26 NOTE — Progress Notes (Signed)
ROB- tail bone pain x 2 weeks.

## 2020-10-08 ENCOUNTER — Other Ambulatory Visit: Payer: Self-pay | Admitting: Obstetrics and Gynecology

## 2020-10-08 DIAGNOSIS — K219 Gastro-esophageal reflux disease without esophagitis: Secondary | ICD-10-CM

## 2020-10-10 NOTE — Progress Notes (Signed)
ROB: Patient overall doing well. Concerned about size of fetus, feels she has been gaining more weight recently. TWG in pregnancy is 19 lbs, offered reassurance. Leopold's notes ~ 7-7.5 lbs. 36 week labs done today. Discussed labor precautions. Also needs rhogam today. RTC in 1 week.

## 2020-10-11 ENCOUNTER — Other Ambulatory Visit: Payer: Self-pay

## 2020-10-11 ENCOUNTER — Ambulatory Visit (INDEPENDENT_AMBULATORY_CARE_PROVIDER_SITE_OTHER): Payer: Medicaid Other | Admitting: Obstetrics and Gynecology

## 2020-10-11 ENCOUNTER — Encounter: Payer: Self-pay | Admitting: Obstetrics and Gynecology

## 2020-10-11 VITALS — BP 121/82 | HR 70 | Ht 66.0 in | Wt 184.4 lb

## 2020-10-11 DIAGNOSIS — O09523 Supervision of elderly multigravida, third trimester: Secondary | ICD-10-CM | POA: Diagnosis not present

## 2020-10-11 DIAGNOSIS — Z3A36 36 weeks gestation of pregnancy: Secondary | ICD-10-CM | POA: Diagnosis not present

## 2020-10-11 DIAGNOSIS — O26899 Other specified pregnancy related conditions, unspecified trimester: Secondary | ICD-10-CM | POA: Diagnosis not present

## 2020-10-11 DIAGNOSIS — Z6791 Unspecified blood type, Rh negative: Secondary | ICD-10-CM | POA: Diagnosis not present

## 2020-10-11 LAB — POCT URINALYSIS DIPSTICK OB
Bilirubin, UA: NEGATIVE
Blood, UA: NEGATIVE
Glucose, UA: NEGATIVE
Nitrite, UA: NEGATIVE
POC,PROTEIN,UA: NEGATIVE
Spec Grav, UA: 1.01 (ref 1.010–1.025)
Urobilinogen, UA: 0.2 E.U./dL
pH, UA: 7.5 (ref 5.0–8.0)

## 2020-10-11 MED ORDER — RHO D IMMUNE GLOBULIN 1500 UNIT/2ML IJ SOSY
300.0000 ug | PREFILLED_SYRINGE | Freq: Once | INTRAMUSCULAR | Status: AC
  Administered 2020-10-11: 300 ug via INTRAMUSCULAR

## 2020-10-11 NOTE — Patient Instructions (Signed)
Rh0 [D] Immune Globulin injection What is this medicine? RhO [D] IMMUNE GLOBULIN (i MYOON GLOB yoo lin) is used to treat idiopathic thrombocytopenic purpura (ITP). This medicine is used in RhO negative mothers who are pregnant with a RhO positive child. It is also used after a transfusion of RhO positive blood into a RhO negative person. This medicine may be used for other purposes; ask your health care provider or pharmacist if you have questions. COMMON BRAND NAME(S): BayRho-D, HyperRHO S/D, MICRhoGAM, RhoGAM, Rhophylac, WinRho SDF What should I tell my health care provider before I take this medicine? They need to know if you have any of these conditions:  bleeding disorders  low levels of immunoglobulin A in the body  no spleen  an unusual or allergic reaction to human immune globulin, other medicines, foods, dyes, or preservatives  pregnant or trying to get pregnant  breast-feeding How should I use this medicine? This medicine is for injection into a muscle or into a vein. It is given by a health care professional in a hospital or clinic setting. Talk to your pediatrician regarding the use of this medicine in children. This medicine is not approved for use in children. Overdosage: If you think you have taken too much of this medicine contact a poison control center or emergency room at once. NOTE: This medicine is only for you. Do not share this medicine with others. What if I miss a dose? It is important not to miss your dose. Call your doctor or health care professional if you are unable to keep an appointment. What may interact with this medicine?  live virus vaccines, like measles, mumps, or rubella This list may not describe all possible interactions. Give your health care provider a list of all the medicines, herbs, non-prescription drugs, or dietary supplements you use. Also tell them if you smoke, drink alcohol, or use illegal drugs. Some items may interact with your  medicine. What should I watch for while using this medicine? This medicine is made from human blood. It may be possible to pass an infection in this medicine. Talk to your doctor about the risks and benefits of this medicine. This medicine may interfere with live virus vaccines. Before you get live virus vaccines tell your health care professional if you have received this medicine within the past 3 months. What side effects may I notice from receiving this medicine? Side effects that you should report to your doctor or health care professional as soon as possible:  allergic reactions like skin rash, itching or hives, swelling of the face, lips, or tongue  breathing problems  chest pain or tightness  yellowing of the eyes or skin Side effects that usually do not require medical attention (report to your doctor or health care professional if they continue or are bothersome):  fever  pain and tenderness at site where injected This list may not describe all possible side effects. Call your doctor for medical advice about side effects. You may report side effects to FDA at 1-800-FDA-1088. Where should I keep my medicine? This drug is given in a hospital or clinic and will not be stored at home. NOTE: This sheet is a summary. It may not cover all possible information. If you have questions about this medicine, talk to your doctor, pharmacist, or health care provider.  2021 Elsevier/Gold Standard (2008-03-12 14:06:10)   Signs and Symptoms of Labor Labor is the body's natural process of moving the baby and the placenta out of the uterus.  The process of labor usually starts when the baby is full-term, between 67 and 40 weeks of pregnancy. Signs and symptoms that you are close to going into labor As your body prepares for labor and the birth of your baby, you may notice the following symptoms in the weeks and days before true labor starts:  Passing a small amount of thick, bloody mucus from  your vagina. This is called normal bloody show or losing your mucus plug. This may happen more than a week before labor begins, or right before labor begins, as the opening of the cervix starts to widen (dilate). For some women, the entire mucus plug passes at once. For others, pieces of the mucus plug may gradually pass over several days.  Your baby moving (dropping) lower in your pelvis to get into position for birth (lightening). When this happens, you may feel more pressure on your bladder and pelvic bone and less pressure on your ribs. This may make it easier to breathe. It may also cause you to need to urinate more often and have problems with bowel movements.  Having "practice contractions," also called Braxton Hicks contractions or false labor. These occur at irregular (unevenly spaced) intervals that are more than 10 minutes apart. False labor contractions are common after exercise or sexual activity. They will stop if you change position, rest, or drink fluids. These contractions are usually mild and do not get stronger over time. They may feel like: ? A backache or back pain. ? Mild cramps, similar to menstrual cramps. ? Tightening or pressure in your abdomen. Other early symptoms include:  Nausea or loss of appetite.  Diarrhea.  Having a sudden burst of energy, or feeling very tired.  Mood changes.  Having trouble sleeping.   Signs and symptoms that labor has begun Signs that you are in labor may include:  Having contractions that come at regular (evenly spaced) intervals and increase in intensity. This may feel like more intense tightening or pressure in your abdomen that moves to your back. ? Contractions may also feel like rhythmic pain in your upper thighs or back that comes and goes at regular intervals. ? For first-time mothers, this change in intensity of contractions often occurs at a more gradual pace. ? Women who have given birth before may notice a more rapid  progression of contraction changes.  Feeling pressure in the vaginal area.  Your water breaking (rupture of membranes). This is when the sac of fluid that surrounds your baby breaks. Fluid leaking from your vagina may be clear or blood-tinged. Labor usually starts within 24 hours of your water breaking, but it may take longer to begin. ? Some women may feel a sudden gush of fluid. ? Others notice that their underwear repeatedly becomes damp. Follow these instructions at home:  When labor starts, or if your water breaks, call your health care provider or nurse care line. Based on your situation, they will determine when you should go in for an exam.  During early labor, you may be able to rest and manage symptoms at home. Some strategies to try at home include: ? Breathing and relaxation techniques. ? Taking a warm bath or shower. ? Listening to music. ? Using a heating pad on the lower back for pain. If you are directed to use heat:  Place a towel between your skin and the heat source.  Leave the heat on for 20-30 minutes.  Remove the heat if your skin turns bright red. This  is especially important if you are unable to feel pain, heat, or cold. You may have a greater risk of getting burned.   Contact a health care provider if:  Your labor has started.  Your water breaks. Get help right away if:  You have painful, regular contractions that are 5 minutes apart or less.  Labor starts before you are [redacted] weeks along in your pregnancy.  You have a fever.  You have bright red blood coming from your vagina.  You do not feel your baby moving.  You have a severe headache with or without vision problems.  You have severe nausea, vomiting, or diarrhea.  You have chest pain or shortness of breath. These symptoms may represent a serious problem that is an emergency. Do not wait to see if the symptoms will go away. Get medical help right away. Call your local emergency services (911 in  the [place] Do not drive yourself to the hospital. Summary  Labor is your body's natural process of moving your baby and the placenta out of your uterus.  The process of labor usually starts when your baby is full-term, between 3 and 40 weeks of pregnancy.  When labor starts, or if your water breaks, call your health care provider or nurse care line. Based on your situation, they will determine when you should go in for an exam. This information is not intended to replace advice given to you by your health care provider. Make sure you discuss any questions you have with your health care provider. Document Revised: 05/04/2020 Document Reviewed: 05/04/2020 Elsevier Patient Education  2021 [employer].

## 2020-10-13 LAB — STREP GP B NAA: Strep Gp B NAA: NEGATIVE

## 2020-10-14 LAB — GC/CHLAMYDIA PROBE AMP
Chlamydia trachomatis, NAA: NEGATIVE
Neisseria Gonorrhoeae by PCR: NEGATIVE

## 2020-10-17 ENCOUNTER — Other Ambulatory Visit: Payer: Self-pay

## 2020-10-17 ENCOUNTER — Encounter: Payer: Self-pay | Admitting: Obstetrics and Gynecology

## 2020-10-17 ENCOUNTER — Ambulatory Visit (INDEPENDENT_AMBULATORY_CARE_PROVIDER_SITE_OTHER): Payer: Medicaid Other | Admitting: Obstetrics and Gynecology

## 2020-10-17 VITALS — BP 136/83 | HR 79 | Wt 183.3 lb

## 2020-10-17 DIAGNOSIS — Z3A36 36 weeks gestation of pregnancy: Secondary | ICD-10-CM

## 2020-10-17 DIAGNOSIS — O09523 Supervision of elderly multigravida, third trimester: Secondary | ICD-10-CM

## 2020-10-17 LAB — POCT URINALYSIS DIPSTICK OB
Bilirubin, UA: NEGATIVE
Blood, UA: NEGATIVE
Glucose, UA: NEGATIVE
Ketones, UA: NEGATIVE
Leukocytes, UA: NEGATIVE
Nitrite, UA: NEGATIVE
POC,PROTEIN,UA: NEGATIVE
Spec Grav, UA: 1.01 (ref 1.010–1.025)
Urobilinogen, UA: 0.2 E.U./dL
pH, UA: 7.5 (ref 5.0–8.0)

## 2020-10-17 NOTE — Progress Notes (Signed)
ROB: No complaints.  Has daily irregular contractions.  Signs and symptoms of labor discussed.

## 2020-10-22 ENCOUNTER — Telehealth: Payer: Self-pay | Admitting: Obstetrics and Gynecology

## 2020-10-22 NOTE — Telephone Encounter (Signed)
Dr. Valentino Saxon filled out this paperwork.

## 2020-10-22 NOTE — Telephone Encounter (Signed)
Tina- [employer] called this morning about LOA paperowrk for this patient- states that she needs certain dates adjusted on paperwork. Her call back number is:  [phone] ext. [phone]

## 2020-10-23 NOTE — Telephone Encounter (Signed)
Called Tina from [employer] no answer LM via VM my name, pt name and office I was calling from along with phone number to office.

## 2020-10-24 ENCOUNTER — Encounter: Payer: Self-pay | Admitting: Obstetrics and Gynecology

## 2020-10-24 ENCOUNTER — Other Ambulatory Visit: Payer: Self-pay

## 2020-10-24 ENCOUNTER — Ambulatory Visit (INDEPENDENT_AMBULATORY_CARE_PROVIDER_SITE_OTHER): Payer: Medicaid Other | Admitting: Obstetrics and Gynecology

## 2020-10-24 VITALS — BP 125/85 | HR 76 | Wt 185.8 lb

## 2020-10-24 DIAGNOSIS — O09523 Supervision of elderly multigravida, third trimester: Secondary | ICD-10-CM

## 2020-10-24 DIAGNOSIS — Z3A37 37 weeks gestation of pregnancy: Secondary | ICD-10-CM

## 2020-10-24 DIAGNOSIS — O479 False labor, unspecified: Secondary | ICD-10-CM

## 2020-10-24 LAB — POCT URINALYSIS DIPSTICK OB
Bilirubin, UA: NEGATIVE
Blood, UA: NEGATIVE
Glucose, UA: NEGATIVE
Ketones, UA: NEGATIVE
Leukocytes, UA: NEGATIVE
Nitrite, UA: NEGATIVE
POC,PROTEIN,UA: NEGATIVE
Spec Grav, UA: 1.01 (ref 1.010–1.025)
Urobilinogen, UA: 0.2 E.U./dL
pH, UA: 7 (ref 5.0–8.0)

## 2020-10-24 NOTE — Telephone Encounter (Signed)
Spoke to pt today during her office visit to get the information needed to complete her FMLA form.

## 2020-10-24 NOTE — Telephone Encounter (Signed)
Tina at [employer] called no answer LM via VM the reason for my call. My name, pt name office name and number left on VM.

## 2020-10-24 NOTE — Progress Notes (Signed)
ROB: Notes contractions, occurring more often, however still not close together. Reiterated labor precautions.  36 week labs negative. RTC in 1 week.

## 2020-10-25 NOTE — Telephone Encounter (Signed)
Spoke to Aten from [employer] and she provided the information I need to complete the pt's fmla forms.

## 2020-10-31 ENCOUNTER — Ambulatory Visit (INDEPENDENT_AMBULATORY_CARE_PROVIDER_SITE_OTHER): Payer: Medicaid Other | Admitting: Obstetrics and Gynecology

## 2020-10-31 ENCOUNTER — Encounter: Payer: Self-pay | Admitting: Obstetrics and Gynecology

## 2020-10-31 ENCOUNTER — Other Ambulatory Visit: Payer: Self-pay

## 2020-10-31 VITALS — BP 120/76 | HR 71 | Wt 188.2 lb

## 2020-10-31 DIAGNOSIS — Z3A38 38 weeks gestation of pregnancy: Secondary | ICD-10-CM

## 2020-10-31 DIAGNOSIS — O09523 Supervision of elderly multigravida, third trimester: Secondary | ICD-10-CM

## 2020-10-31 LAB — POCT URINALYSIS DIPSTICK OB
Bilirubin, UA: NEGATIVE
Blood, UA: NEGATIVE
Glucose, UA: NEGATIVE
Ketones, UA: NEGATIVE
Leukocytes, UA: NEGATIVE
Nitrite, UA: NEGATIVE
POC,PROTEIN,UA: NEGATIVE
Spec Grav, UA: 1.01 (ref 1.010–1.025)
Urobilinogen, UA: 0.2 E.U./dL
pH, UA: 7 (ref 5.0–8.0)

## 2020-10-31 NOTE — Progress Notes (Signed)
ROB: Patient having occasional contractions.  Reports daily fetal movement.  Signs and symptoms of labor reviewed.

## 2020-11-07 ENCOUNTER — Encounter: Payer: Self-pay | Admitting: Obstetrics and Gynecology

## 2020-11-07 ENCOUNTER — Ambulatory Visit (INDEPENDENT_AMBULATORY_CARE_PROVIDER_SITE_OTHER): Payer: Medicaid Other | Admitting: Obstetrics and Gynecology

## 2020-11-07 ENCOUNTER — Other Ambulatory Visit: Payer: Self-pay

## 2020-11-07 VITALS — BP 138/80 | HR 86 | Ht 66.0 in | Wt 186.7 lb

## 2020-11-07 DIAGNOSIS — O09523 Supervision of elderly multigravida, third trimester: Secondary | ICD-10-CM

## 2020-11-07 DIAGNOSIS — Z3A39 39 weeks gestation of pregnancy: Secondary | ICD-10-CM

## 2020-11-07 LAB — POCT URINALYSIS DIPSTICK OB
Bilirubin, UA: NEGATIVE
Blood, UA: NEGATIVE
Glucose, UA: NEGATIVE
Ketones, UA: NEGATIVE
Leukocytes, UA: NEGATIVE
Nitrite, UA: NEGATIVE
POC,PROTEIN,UA: NEGATIVE
Spec Grav, UA: 1.015 (ref 1.010–1.025)
Urobilinogen, UA: 0.2 E.U./dL
pH, UA: 7 (ref 5.0–8.0)

## 2020-11-07 NOTE — Patient Instructions (Addendum)
Signs and Symptoms of Labor Labor is the body's natural process of moving the baby and the placenta out of the uterus. The process of labor usually starts when the baby is full-term, between 37 and 40 weeks of pregnancy. Signs and symptoms that you are close to going into labor As your body prepares for labor and the birth of your baby, you may notice the following symptoms in the weeks and days before true labor starts:  Passing a small amount of thick, bloody mucus from your vagina. This is called normal bloody show or losing your mucus plug. This may happen more than a week before labor begins, or right before labor begins, as the opening of the cervix starts to widen (dilate). For some women, the entire mucus plug passes at once. For others, pieces of the mucus plug may gradually pass over several days.  Your baby moving (dropping) lower in your pelvis to get into position for birth (lightening). When this happens, you may feel more pressure on your bladder and pelvic bone and less pressure on your ribs. This may make it easier to breathe. It may also cause you to need to urinate more often and have problems with bowel movements.  Having "practice contractions," also called Braxton Hicks contractions or false labor. These occur at irregular (unevenly spaced) intervals that are more than 10 minutes apart. False labor contractions are common after exercise or sexual activity. They will stop if you change position, rest, or drink fluids. These contractions are usually mild and do not get stronger over time. They may feel like: ? A backache or back pain. ? Mild cramps, similar to menstrual cramps. ? Tightening or pressure in your abdomen. Other early symptoms include:  Nausea or loss of appetite.  Diarrhea.  Having a sudden burst of energy, or feeling very tired.  Mood changes.  Having trouble sleeping.   Signs and symptoms that labor has begun Signs that you are in labor may  include:  Having contractions that come at regular (evenly spaced) intervals and increase in intensity. This may feel like more intense tightening or pressure in your abdomen that moves to your back. ? Contractions may also feel like rhythmic pain in your upper thighs or back that comes and goes at regular intervals. ? For first-time mothers, this change in intensity of contractions often occurs at a more gradual pace. ? Women who have given birth before may notice a more rapid progression of contraction changes.  Feeling pressure in the vaginal area.  Your water breaking (rupture of membranes). This is when the sac of fluid that surrounds your baby breaks. Fluid leaking from your vagina may be clear or blood-tinged. Labor usually starts within 24 hours of your water breaking, but it may take longer to begin. ? Some women may feel a sudden gush of fluid. ? Others notice that their underwear repeatedly becomes damp. Follow these instructions at home:  When labor starts, or if your water breaks, call your health care provider or nurse care line. Based on your situation, they will determine when you should go in for an exam.  During early labor, you may be able to rest and manage symptoms at home. Some strategies to try at home include: ? Breathing and relaxation techniques. ? Taking a warm bath or shower. ? Listening to music. ? Using a heating pad on the lower back for pain. If you are directed to use heat:  Place a towel between your skin and the   heat source.  Leave the heat on for 20-30 minutes.  Remove the heat if your skin turns bright red. This is especially important if you are unable to feel pain, heat, or cold. You may have a greater risk of getting burned.   Contact a health care provider if:  Your labor has started.  Your water breaks. Get help right away if:  You have painful, regular contractions that are 5 minutes apart or less.  Labor starts before you are [redacted] weeks  along in your pregnancy.  You have a fever.  You have bright red blood coming from your vagina.  You do not feel your baby moving.  You have a severe headache with or without vision problems.  You have severe nausea, vomiting, or diarrhea.  You have chest pain or shortness of breath. These symptoms may represent a serious problem that is an emergency. Do not wait to see if the symptoms will go away. Get medical help right away. Call your local emergency services (911 in the [place] Do not drive yourself to the hospital. Summary  Labor is your body's natural process of moving your baby and the placenta out of your uterus.  The process of labor usually starts when your baby is full-term, between 59 and 40 weeks of pregnancy.  When labor starts, or if your water breaks, call your health care provider or nurse care line. Based on your situation, they will determine when you should go in for an exam. This information is not intended to replace advice given to you by your health care provider. Make sure you discuss any questions you have with your health care provider. Document Revised: 05/04/2020 Document Reviewed: 05/04/2020 Elsevier Patient Education  2021 [employer].    Labor Induction Labor induction is when steps are taken to cause a pregnant woman to begin the labor process. Most women go into labor on their own between 37 weeks and 42 weeks of pregnancy. When this does not happen, or when there is a medical need for labor to begin, steps may be taken to induce, or bring on, labor. Labor induction causes a pregnant woman's uterus to contract. It also causes the cervix to soften (ripen), open (dilate), and thin out. Usually, labor is not induced before 39 weeks of pregnancy unless there is a medical reason to do so. When is labor induction considered? Labor induction may be right for you if:  Your pregnancy lasts longer than 41 to 42 weeks.  Your placenta is separating from your  uterus (placental abruption).  You have a rupture of membranes and your labor does not begin.  You have health problems, like diabetes or high blood pressure (preeclampsia) during your pregnancy.  Your baby has stopped growing or does not have enough amniotic fluid. Before labor induction begins, your health care provider will consider the following factors:  Your medical condition and the baby's condition.  How many weeks you have been pregnant.  How mature the baby's lungs are.  The condition of your cervix.  The position of the baby.  The size of your birth canal. Tell a health care provider about:  Any allergies you have.  All medicines you are taking, including vitamins, herbs, eye drops, creams, and over-the-counter medicines.  Any problems you or your family members have had with anesthetic medicines.  Any surgeries you have had.  Any blood disorders you have.  Any medical conditions you have. What are the risks? Generally, this is a safe procedure.  However, problems may occur, including:  Failed induction.  Changes in fetal heart rate, such as being too high, too low, or irregular (erratic).  Infection in the mother or the baby.  Increased risk of having a cesarean delivery.  Breaking off (abruption) of the placenta from the uterus. This is rare.  Rupture of the uterus. This is very rare.  Your baby could fail to get enough blood flow or oxygen. This can be life-threatening. When induction is needed for medical reasons, the benefits generally outweigh the risks. What happens during the procedure? During the procedure, your health care provider will use one of these methods to induce labor:  Stripping the membranes. In this method, the amniotic sac tissue is gently separated from the cervix. This causes the following to happen: ? Your cervix stretches, which in turn causes the release of prostaglandins. ? Prostaglandins induce labor and cause the uterus  to contract. ? This procedure is often done in an office visit. You will be sent home to wait for contractions to begin.  Prostaglandin medicine. This medicine starts contractions and causes the cervix to dilate and ripen. This can be taken by mouth (orally) or by being inserted into the vagina (suppository).  Inserting a small, thin tube (catheter) with a balloon into the vagina and then expanding the balloon with water to dilate the cervix.  Breaking the water. In this method, a small instrument is used to make a small hole in the amniotic sac. This eventually causes the amniotic sac to break. Contractions should begin within a few hours.  Medicine to trigger or strengthen contractions. This medicine is given through an IV that is inserted into a vein in your arm. This procedure may vary among health care providers and hospitals.   Where to find more information  [employer]: www.marchofdimes.org  [employer]: www.acog.org Summary  Labor induction causes a pregnant woman's uterus to contract. It also causes the cervix to soften (ripen), open (dilate), and thin out.  Labor is usually not induced before 39 weeks of pregnancy unless there is a medical reason to do so.  When induction is needed for medical reasons, the benefits generally outweigh the risks.  Talk with your health care provider about which methods of labor induction are right for you. This information is not intended to replace advice given to you by your health care provider. Make sure you discuss any questions you have with your health care provider. Document Revised: 04/25/2020 Document Reviewed: 04/25/2020 Elsevier Patient Education  2021 [employer].

## 2020-11-07 NOTE — Progress Notes (Signed)
ROB: Patient doing well no major complaints.  Membrane sweeping performed at patient's request. If no delivery, to f/u in 5 days for OB visit and NST. To schedule IOL for postdates, on 11/14/20 at 0500. RTC in 5 days, for NST at that time for postdates pregnancy.

## 2020-11-08 ENCOUNTER — Other Ambulatory Visit: Payer: Self-pay

## 2020-11-08 ENCOUNTER — Inpatient Hospital Stay: Payer: Medicaid Other | Admitting: Anesthesiology

## 2020-11-08 ENCOUNTER — Inpatient Hospital Stay
Admission: EM | Admit: 2020-11-08 | Discharge: 2020-11-10 | DRG: 807 | Disposition: A | Discharge status: Home / Self Care | Payer: Medicaid Other | Attending: Obstetrics and Gynecology | Admitting: Obstetrics and Gynecology

## 2020-11-08 ENCOUNTER — Encounter: Payer: Self-pay | Admitting: Obstetrics and Gynecology

## 2020-11-08 DIAGNOSIS — Z20822 Contact with and (suspected) exposure to covid-19: Secondary | ICD-10-CM | POA: Diagnosis not present

## 2020-11-08 DIAGNOSIS — O09523 Supervision of elderly multigravida, third trimester: Secondary | ICD-10-CM

## 2020-11-08 DIAGNOSIS — O480 Post-term pregnancy: Secondary | ICD-10-CM | POA: Diagnosis not present

## 2020-11-08 DIAGNOSIS — Z23 Encounter for immunization: Secondary | ICD-10-CM | POA: Diagnosis not present

## 2020-11-08 DIAGNOSIS — O360130 Maternal care for anti-D [Rh] antibodies, third trimester, not applicable or unspecified: Secondary | ICD-10-CM | POA: Diagnosis not present

## 2020-11-08 DIAGNOSIS — Z3A40 40 weeks gestation of pregnancy: Secondary | ICD-10-CM | POA: Diagnosis not present

## 2020-11-08 LAB — CBC
HCT: 33.6 % — ABNORMAL LOW (ref 36.0–46.0)
Hemoglobin: 11.6 g/dL — ABNORMAL LOW (ref 12.0–15.0)
MCH: 33.3 pg (ref 26.0–34.0)
MCHC: 34.5 g/dL (ref 30.0–36.0)
MCV: 96.6 fL (ref 80.0–100.0)
Platelets: 169 10*3/uL (ref 150–400)
RBC: 3.48 MIL/uL — ABNORMAL LOW (ref 3.87–5.11)
RDW: 13.9 % (ref 11.5–15.5)
WBC: 11.8 10*3/uL — ABNORMAL HIGH (ref 4.0–10.5)
nRBC: 0 % (ref 0.0–0.2)

## 2020-11-08 LAB — GLUCOSE, CAPILLARY: Glucose-Capillary: 80 mg/dL (ref 70–99)

## 2020-11-08 LAB — TYPE AND SCREEN
ABO/RH(D): O NEG
Antibody Screen: POSITIVE

## 2020-11-08 LAB — RESP PANEL BY RT-PCR (FLU A&B, COVID) ARPGX2
Influenza A by PCR: NEGATIVE
Influenza B by PCR: NEGATIVE
SARS Coronavirus 2 by RT PCR: NEGATIVE

## 2020-11-08 MED ORDER — PHENYLEPHRINE 40 MCG/ML (10ML) SYRINGE FOR IV PUSH (FOR BLOOD PRESSURE SUPPORT)
80.0000 ug | PREFILLED_SYRINGE | INTRAVENOUS | Status: DC | PRN
  Filled 2020-11-08: qty 10

## 2020-11-08 MED ORDER — OXYCODONE-ACETAMINOPHEN 5-325 MG PO TABS
1.0000 | ORAL_TABLET | ORAL | Status: DC | PRN
Start: 2020-11-08 — End: 2020-11-09

## 2020-11-08 MED ORDER — AMMONIA AROMATIC IN INHA
RESPIRATORY_TRACT | Status: AC
  Filled 2020-11-08: qty 10

## 2020-11-08 MED ORDER — DIPHENHYDRAMINE HCL 50 MG/ML IJ SOLN
12.5000 mg | INTRAMUSCULAR | Status: DC | PRN
  Administered 2020-11-09: 12.5 mg via INTRAVENOUS
  Filled 2020-11-08: qty 1

## 2020-11-08 MED ORDER — ONDANSETRON HCL 4 MG/2ML IJ SOLN: 4.0000 mg | Freq: Four times a day (QID) | INTRAMUSCULAR | Status: DC | PRN

## 2020-11-08 MED ORDER — MISOPROSTOL 25 MCG QUARTER TABLET
50.0000 ug | ORAL_TABLET | ORAL | Status: DC | PRN
  Filled 2020-11-08: qty 2

## 2020-11-08 MED ORDER — FENTANYL 2.5 MCG/ML W/ROPIVACAINE 0.15% IN NS 100 ML EPIDURAL (ARMC)
EPIDURAL | Status: AC
  Filled 2020-11-08: qty 100

## 2020-11-08 MED ORDER — LACTATED RINGERS IV SOLN
500.0000 mL | Freq: Once | INTRAVENOUS | Status: AC
Start: 2020-11-08 — End: 2020-11-08
  Administered 2020-11-08: 500 mL via INTRAVENOUS

## 2020-11-08 MED ORDER — EPHEDRINE 5 MG/ML INJ
10.0000 mg | INTRAVENOUS | Status: DC | PRN
  Filled 2020-11-08: qty 2

## 2020-11-08 MED ORDER — LIDOCAINE-EPINEPHRINE (PF) 1.5 %-1:200000 IJ SOLN
INTRAMUSCULAR | Status: DC | PRN
  Administered 2020-11-08: 4 mL via EPIDURAL

## 2020-11-08 MED ORDER — LACTATED RINGERS IV BOLUS
1000.0000 mL | Freq: Once | INTRAVENOUS | Status: AC
  Administered 2020-11-08: 1000 mL via INTRAVENOUS

## 2020-11-08 MED ORDER — OXYTOCIN-SODIUM CHLORIDE 30-0.9 UT/500ML-% IV SOLN
INTRAVENOUS | Status: AC
  Filled 2020-11-08: qty 500

## 2020-11-08 MED ORDER — MISOPROSTOL 200 MCG PO TABS
ORAL_TABLET | ORAL | Status: AC
  Filled 2020-11-08: qty 4

## 2020-11-08 MED ORDER — BUPIVACAINE HCL (PF) 0.25 % IJ SOLN
INTRAMUSCULAR | Status: DC | PRN
  Administered 2020-11-08: 5 mL via EPIDURAL

## 2020-11-08 MED ORDER — LACTATED RINGERS IV SOLN: 500.0000 mL | INTRAVENOUS | Status: DC | PRN

## 2020-11-08 MED ORDER — FENTANYL 2.5 MCG/ML W/ROPIVACAINE 0.15% IN NS 100 ML EPIDURAL (ARMC)
12.0000 mL/h | EPIDURAL | Status: DC
  Administered 2020-11-08: 12 mL/h via EPIDURAL

## 2020-11-08 MED ORDER — SOD CITRATE-CITRIC ACID 500-334 MG/5ML PO SOLN: 30.0000 mL | ORAL | Status: DC | PRN

## 2020-11-08 MED ORDER — LIDOCAINE HCL (PF) 1 % IJ SOLN
INTRAMUSCULAR | Status: AC
  Filled 2020-11-08: qty 30

## 2020-11-08 MED ORDER — OXYTOCIN 10 UNIT/ML IJ SOLN
INTRAMUSCULAR | Status: AC
  Filled 2020-11-08: qty 2

## 2020-11-08 MED ORDER — OXYTOCIN-SODIUM CHLORIDE 30-0.9 UT/500ML-% IV SOLN
1.0000 m[IU]/min | INTRAVENOUS | Status: DC
  Administered 2020-11-08: 4 m[IU]/min via INTRAVENOUS

## 2020-11-08 MED ORDER — LIDOCAINE HCL (PF) 1 % IJ SOLN
INTRAMUSCULAR | Status: DC | PRN
  Administered 2020-11-08: 3 mL via INTRADERMAL

## 2020-11-08 MED ORDER — TERBUTALINE SULFATE 1 MG/ML IJ SOLN: 0.2500 mg | Freq: Once | INTRAMUSCULAR | Status: DC | PRN

## 2020-11-08 MED ORDER — ACETAMINOPHEN 325 MG PO TABS
650.0000 mg | ORAL_TABLET | ORAL | Status: DC | PRN
  Filled 2020-11-08: qty 2

## 2020-11-08 MED ORDER — OXYCODONE-ACETAMINOPHEN 5-325 MG PO TABS: 2.0000 | ORAL_TABLET | ORAL | Status: DC | PRN

## 2020-11-08 MED ORDER — OXYTOCIN BOLUS FROM INFUSION
333.0000 mL | Freq: Once | INTRAVENOUS | Status: AC
  Administered 2020-11-08: 333 mL via INTRAVENOUS

## 2020-11-08 MED ORDER — OXYTOCIN-SODIUM CHLORIDE 30-0.9 UT/500ML-% IV SOLN: 2.5000 [IU]/h | INTRAVENOUS | Status: DC

## 2020-11-08 MED ORDER — LACTATED RINGERS IV SOLN: 125.0000 mL/h | INTRAVENOUS | Status: DC

## 2020-11-08 MED ORDER — BUTORPHANOL TARTRATE 1 MG/ML IJ SOLN
1.0000 mg | INTRAMUSCULAR | Status: DC | PRN
Start: 2020-11-08 — End: 2020-11-09

## 2020-11-08 MED ORDER — LIDOCAINE HCL (PF) 1 % IJ SOLN: 30.0000 mL | INTRAMUSCULAR | Status: DC | PRN

## 2020-11-08 NOTE — H&P (Signed)
History and Physical   HPI  Janet Coleman is a 35 y.o. [id number] at [redacted]w[redacted]d Estimated Date of Delivery: 11/08/20 who is being admitted for labor management.   OB History  OB History  Gravida Para Term Preterm AB Living  6 3 3  0 2 3  SAB IAB Ectopic Multiple Live Births  2 0 0 0 3    # Outcome Date GA Lbr Len/2nd Weight Sex Delivery Anes PTL Lv  6 Gravida           5 Term 01/10/19 [redacted]w[redacted]d 01:20 / 00:56 3280 g F Vag-Spont EPI  LIV     Name: Anna,GIRL Norely     Apgar1: 8  Apgar5: 9  4 Term 2007 [redacted]w[redacted]d  3345 g F Vag-Spont   LIV  3 Term 2006 [redacted]w[redacted]d  3742 g F Vag-Spont   LIV  2 SAB           1 SAB             PROBLEM LIST  Pregnancy complications or risks: Patient Active Problem List   Diagnosis Date Noted  . Post-dates pregnancy 11/08/2020  . Supervision of elderly multigravida (>=36 years old at time of delivery), second trimester 05/22/2020  . Premature rupture of membranes 01/10/2019  . Back pain in pregnancy 12/20/2018  . Rh negative state in antepartum period 06/28/2018  . Elevated cortisol level 02/21/2015  . Hypoglycemia 02/08/2015  . GERD (gastroesophageal reflux disease)   . Hypertension   . Hypothyroidism   . Tobacco use   . Migraines     Prenatal labs and studies: ABO, Rh: --/--/O NEG (04/15 1825) Antibody: POS (04/15 1825) Rubella: 5.94 (09/09 1107) RPR: Non Reactive (02/03 0909)  HBsAg: Negative (09/09 1107)  HIV: Non Reactive (09/09 1107)  05-14-2002-- (03/18 1106)   Past Medical History:  Diagnosis Date  . GERD (gastroesophageal reflux disease)   . Hypertension   . Hypothyroidism   . 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  . Migraines   . Reactive hypoglycemia   . Reactive hypoglycemia   . Tobacco use   . Vaginal Pap smear, abnormal      Past Surgical History:  Procedure Laterality Date  . BELPHAROPTOSIS REPAIR     eye lid lift  . DILATION AND CURETTAGE OF UTERUS     x 2  .  LEEP     x 2. last paps 2011, 2012, 2013 were normal     Medications    Current Discharge Medication List    CONTINUE these medications which have NOT CHANGED   Details  Doxylamine-Pyridoxine (DICLEGIS) 10-10 MG TBEC Take 2 tablets by mouth at bedtime. If symptoms persist, add one tablet in the morning and one in the afternoon Qty: 100 tablet, Refills: 5   Associated Diagnoses: Supervision of elderly multigravida (>=60 years old at time of delivery), second trimester; [redacted] weeks gestation of pregnancy    pantoprazole (PROTONIX) 20 MG tablet TAKE 1 TABLET BY MOUTH TWICE DAILY Qty: 60 tablet, Refills: 1   Associated Diagnoses: Mild acid reflux    Prenatal-DSS-FeCb-FeGl-FA (CITRANATAL BLOOM) 90-1 MG TABS Take 1 tablet by mouth daily. Qty: 30 tablet, Refills: 11   Associated Diagnoses: Encounter for supervision of other normal pregnancy in first trimester         Allergies  Toradol [ketorolac tromethamine] and Ketorolac  Review of Systems  Pertinent items noted in HPI and remainder of comprehensive ROS otherwise negative.  Physical Exam  BP 118/90   Pulse 61   Temp 98.1 F (36.7 C) (Oral)   Resp 18   Ht 5\' 6"  (1.676 m)   Wt 83.9 kg   LMP 02/02/2020   SpO2 100%   BMI 29.86 kg/m   Lungs:  CTA B Cardio: RRR without M/R/G Abd: Soft, gravid, NT Presentation: cephalic EXT: No C/C/ 1+ Edema DTRs: 2+ B CERVIX: Dilation: 6.5 Effacement (%): 100 Cervical Position: Middle Station: -1 Presentation: Vertex Exam by:: [email or web address] RN  See Prenatal records for more detailed PE.   AROM: - clear fluid noted.  FHR:  Variability: Good {> 6 bpm)  Toco: Uterine Contractions: Q5 min  Test Results  Results for orders placed or performed during the hospital encounter of 11/08/20 (from the past 24 hour(s))  CBC     Status: Abnormal   Collection Time: 11/08/20  6:25 PM  Result Value Ref Range   WBC 11.8 (H) 4.0 - 10.5 K/uL   RBC 3.48 (L) 3.87 - 5.11 MIL/uL   Hemoglobin  11.6 (L) 12.0 - 15.0 g/dL   HCT 11/10/20 (L) 25.0 - 03.7 %   MCV 96.6 80.0 - 100.0 fL   MCH 33.3 26.0 - 34.0 pg   MCHC 34.5 30.0 - 36.0 g/dL   RDW 04.8 88.9 - 16.9 %   Platelets 169 150 - 400 K/uL   nRBC 0.0 0.0 - 0.2 %  Type and screen     Status: None   Collection Time: 11/08/20  6:25 PM  Result Value Ref Range   ABO/RH(D) O NEG    Antibody Screen POS    Sample Expiration      11/11/2020,2359 Performed at [facility], [address] [place], [place]   Resp Panel by RT-PCR (Flu A&B, Covid) Nasopharyngeal Swab     Status: None   Collection Time: 11/08/20  6:25 PM   Specimen: Nasopharyngeal Swab; Nasopharyngeal(NP) swabs in vial transport medium  Result Value Ref Range   SARS Coronavirus 2 by RT PCR NEGATIVE NEGATIVE   Influenza A by PCR NEGATIVE NEGATIVE   Influenza B by PCR NEGATIVE NEGATIVE   Group B Strep negative  Assessment   11/10/20 at [redacted]w[redacted]d Estimated Date of Delivery: 11/08/20  The fetus is reassuring.   Patient Active Problem List   Diagnosis Date Noted  . Post-dates pregnancy 11/08/2020  . Supervision of elderly multigravida (>=57 years old at time of delivery), second trimester 05/22/2020  . Premature rupture of membranes 01/10/2019  . Back pain in pregnancy 12/20/2018  . Rh negative state in antepartum period 06/28/2018  . Elevated cortisol level 02/21/2015  . Hypoglycemia 02/08/2015  . GERD (gastroesophageal reflux disease)   . Hypertension   . Hypothyroidism   . Tobacco use   . Migraines     Plan  1. Admit to L&D :   2. EFM: -- Category 1 3. Epidural - pt now comfortable   4. Admission labs  5. Expect AROM to increase contractions otherwise start Pitocin 6. Expect vaginal birth.  02/10/2015, M.D. 11/08/2020 9:09 PM

## 2020-11-08 NOTE — Anesthesia Procedure Notes (Signed)
Epidural Patient location during procedure: OB  Staffing Anesthesiologist: Piscitello, Cleda Mccreedy, MD Performed: anesthesiologist   Preanesthetic Checklist Completed: patient identified, IV checked, site marked, risks and benefits discussed, surgical consent, monitors and equipment checked, pre-op evaluation and timeout performed  Epidural Patient position: sitting Prep: ChloraPrep Patient monitoring: heart rate, continuous pulse ox and blood pressure Approach: midline Location: L5-S1 Injection technique: LOR saline  Needle:  Needle type: Tuohy  Needle gauge: 17 G Needle length: 9 cm and 9 Needle insertion depth: 5 cm Catheter type: closed end flexible Catheter size: 19 Gauge Catheter at skin depth: 11 cm Test dose: negative and 1.5% lidocaine with Epi 1:200 K  Assessment Sensory level: T10 Events: blood not aspirated, injection not painful, no injection resistance, no paresthesia and negative IV test  Additional Notes 1st attempt Pt. Evaluated and documentation done after procedure finished. Patient identified. Risks/Benefits/Options discussed with patient including but not limited to bleeding, infection, nerve damage, paralysis, failed block, incomplete pain control, headache, blood pressure changes, nausea, vomiting, reactions to medication both or allergic, itching and postpartum back pain. Confirmed with bedside nurse the patient's most recent platelet count. Confirmed with patient that they are not currently taking any anticoagulation, have any bleeding history or any family history of bleeding disorders. Patient expressed understanding and wished to proceed. All questions were answered. Sterile technique was used throughout the entire procedure. Please see nursing notes for vital signs. Test dose was given through epidural catheter and negative prior to continuing to dose epidural or start infusion. Warning signs of high block given to the patient including shortness of  breath, tingling/numbness in hands, complete motor block, or any concerning symptoms with instructions to call for help. Patient was given instructions on fall risk and not to get out of bed. All questions and concerns addressed with instructions to call with any issues or inadequate analgesia.   Patient tolerated the insertion well without immediate complications.Reason for block:procedure for pain

## 2020-11-08 NOTE — Anesthesia Preprocedure Evaluation (Signed)
Anesthesia Evaluation  Patient identified by MRN, date of birth, ID band Patient awake    Reviewed: Allergy & Precautions, H&P , NPO status , Patient's Chart, lab work & pertinent test results, reviewed documented beta blocker date and time   Airway Mallampati: II  TM Distance: >3 FB Neck ROM: full    Dental no notable dental hx. (+) Teeth Intact   Pulmonary neg pulmonary ROS, Current Smoker,    Pulmonary exam normal breath sounds clear to auscultation       Cardiovascular Exercise Tolerance: Good hypertension, On Medications negative cardio ROS   Rhythm:regular Rate:Normal     Neuro/Psych  Headaches, negative psych ROS   GI/Hepatic Neg liver ROS, GERD  ,  Endo/Other  diabetesHypothyroidism   Renal/GU      Musculoskeletal   Abdominal   Peds  Hematology negative hematology ROS (+)   Anesthesia Other Findings   Reproductive/Obstetrics (+) Pregnancy                             Anesthesia Physical Anesthesia Plan  ASA: II  Anesthesia Plan: Epidural   Post-op Pain Management:    Induction:   PONV Risk Score and Plan:   Airway Management Planned:   Additional Equipment:   Intra-op Plan:   Post-operative Plan:   Informed Consent: I have reviewed the patients History and Physical, chart, labs and discussed the procedure including the risks, benefits and alternatives for the proposed anesthesia with the patient or authorized representative who has indicated his/her understanding and acceptance.       Plan Discussed with:   Anesthesia Plan Comments:         Anesthesia Quick Evaluation

## 2020-11-08 NOTE — OB Triage Note (Signed)
Pt presents to L&D with c/o contractions q 6 min since 11am. Pt reports brownish discharge since Dr Valentino Saxon stripped her membranes yesterday. Pt was 1-2 cm dilated in the office yesterday. Pt denies LOF. Pt reports good fetal movement. EFM applied and explained, plan to monitor fetal and maternal well being and assess for labor.

## 2020-11-09 ENCOUNTER — Encounter: Payer: Self-pay | Admitting: Obstetrics and Gynecology

## 2020-11-09 LAB — RPR: RPR Ser Ql: NONREACTIVE

## 2020-11-09 MED ORDER — OXYTOCIN-SODIUM CHLORIDE 30-0.9 UT/500ML-% IV SOLN
2.5000 [IU]/h | INTRAVENOUS | Status: DC | PRN
  Administered 2020-11-09: 2.5 [IU]/h via INTRAVENOUS
  Filled 2020-11-09: qty 500

## 2020-11-09 MED ORDER — BENZOCAINE-MENTHOL 20-0.5 % EX AERO: 1.0000 "application " | INHALATION_SPRAY | CUTANEOUS | Status: DC | PRN

## 2020-11-09 MED ORDER — IBUPROFEN 600 MG PO TABS
600.0000 mg | ORAL_TABLET | Freq: Four times a day (QID) | ORAL | Status: DC
  Administered 2020-11-09 – 2020-11-10 (×6): 600 mg via ORAL
  Filled 2020-11-09 (×5): qty 1

## 2020-11-09 MED ORDER — PRENATAL MULTIVITAMIN CH
1.0000 | ORAL_TABLET | Freq: Every day | ORAL | Status: DC
  Administered 2020-11-09: 1 via ORAL
  Filled 2020-11-09: qty 1

## 2020-11-09 MED ORDER — SIMETHICONE 80 MG PO CHEW: 80.0000 mg | CHEWABLE_TABLET | ORAL | Status: DC | PRN

## 2020-11-09 MED ORDER — IBUPROFEN 600 MG PO TABS
ORAL_TABLET | ORAL | Status: AC
  Filled 2020-11-09: qty 1

## 2020-11-09 MED ORDER — DOCUSATE SODIUM 100 MG PO CAPS
100.0000 mg | ORAL_CAPSULE | Freq: Two times a day (BID) | ORAL | Status: DC
  Administered 2020-11-09: 100 mg via ORAL
  Filled 2020-11-09 (×2): qty 1

## 2020-11-09 MED ORDER — ACETAMINOPHEN 325 MG PO TABS
650.0000 mg | ORAL_TABLET | ORAL | Status: DC | PRN
  Administered 2020-11-09 – 2020-11-10 (×3): 650 mg via ORAL
  Filled 2020-11-09 (×2): qty 2

## 2020-11-09 MED ORDER — TETANUS-DIPHTH-ACELL PERTUSSIS 5-2.5-18.5 LF-MCG/0.5 IM SUSY
0.5000 mL | PREFILLED_SYRINGE | Freq: Once | INTRAMUSCULAR | Status: AC
  Administered 2020-11-09: 0.5 mL via INTRAMUSCULAR

## 2020-11-09 MED ORDER — OXYCODONE-ACETAMINOPHEN 5-325 MG PO TABS
1.0000 | ORAL_TABLET | ORAL | Status: DC | PRN
Start: 2020-11-09 — End: 2020-11-10

## 2020-11-09 MED ORDER — ZOLPIDEM TARTRATE 5 MG PO TABS: 5.0000 mg | ORAL_TABLET | Freq: Every evening | ORAL | Status: DC | PRN

## 2020-11-09 MED ORDER — DIPHENHYDRAMINE HCL 25 MG PO CAPS
25.0000 mg | ORAL_CAPSULE | Freq: Four times a day (QID) | ORAL | Status: DC | PRN
Start: 2020-11-09 — End: 2020-11-10

## 2020-11-09 NOTE — Anesthesia Post-op Follow-up Note (Signed)
  Anesthesia Pain Follow-up Note  Patient: Janet Coleman  Day #: 1  Date of Follow-up: 11/09/2020 Time: 10:53 AM  Last Vitals:  Vitals:   11/09/20 0515 11/09/20 0837  BP: 120/81 122/87  Pulse: (!) 58 69  Resp: 18 20  Temp: 36.7 C 36.9 C  SpO2: 100% 97%    Level of Consciousness: alert  Pain: mild   Side Effects:None  Catheter Site Exam:clean, dry     Plan: D/C from anesthesia care at surgeon's request  Lenard Simmer

## 2020-11-09 NOTE — Progress Notes (Signed)
Progress Note - Vaginal Delivery  Janet Coleman is a 35 y.o. [id number] now PP day 1 s/p Vaginal, Spontaneous .   Subjective:  The patient reports no complaints, up ad lib, voiding and tolerating PO   Objective:  Vital signs in last 24 hours: Temp:  [97.8 F (36.6 C)-98.5 F (36.9 C)] 98 F (36.7 C) (04/16 1116) Pulse Rate:  [56-93] 67 (04/16 1116) Resp:  [18-20] 18 (04/16 1116) BP: (107-133)/(51-90) 133/83 (04/16 1116) SpO2:  [97 %-100 %] 97 % (04/16 0837) Weight:  [83.9 kg] 83.9 kg (04/15 1626)  Physical Exam:  General: alert and cooperative Lochia: appropriate Uterine Fundus: firm    Data Review Recent Labs    11/08/20 1825  HGB 11.6*  HCT 33.6*    Assessment/Plan: Active Problems:   Post-dates pregnancy   Plan for discharge tomorrow   -- Continue routine PP care.     Elonda Husky, M.D. 11/09/2020 2:18 PM

## 2020-11-09 NOTE — Anesthesia Postprocedure Evaluation (Signed)
Anesthesia Post Note  Patient: Janet Coleman  Procedure(s) Performed: AN AD HOC LABOR EPIDURAL  Patient location during evaluation: Mother Baby Anesthesia Type: Epidural Level of consciousness: awake and alert Pain management: pain level controlled Vital Signs Assessment: post-procedure vital signs reviewed and stable Respiratory status: spontaneous breathing, nonlabored ventilation and respiratory function stable Cardiovascular status: stable Postop Assessment: able to ambulate and adequate PO intake (Headache last night improved with sleep; some back soreness today.) Anesthetic complications: no   No complications documented.   Last Vitals:  Vitals:   11/09/20 0515 11/09/20 0837  BP: 120/81 122/87  Pulse: (!) 58 69  Resp: 18 20  Temp: 36.7 C 36.9 C  SpO2: 100% 97%    Last Pain:  Vitals:   11/09/20 0837  TempSrc: Oral  PainSc:                  Lenard Simmer

## 2020-11-10 NOTE — Discharge Summary (Signed)
Patient Name: Janet Coleman DOB: 10/01/1985 MRN: [id number]                            Discharge Summary  Date of Admission: 11/08/2020 Date of Discharge: 11/10/2020 Delivering Provider: Linzie Collin   Admitting Diagnosis: Post-dates pregnancy [O48.0] at [redacted]w[redacted]d Secondary diagnosis:  Active Problems:   Post-dates pregnancy AMA  Mode of Delivery: normal spontaneous vaginal delivery              Discharge diagnosis: Term Pregnancy Delivered      Intrapartum Procedures: Atificial rupture of membranes, epidural and pitocin augmentation   Post partum procedures:   Complications: none                     Discharge Day SOAP Note:  Progress Note - Vaginal Delivery  Janet Coleman is a 35 y.o. [id number] now PP day 2 s/p Vaginal, Spontaneous . Delivery was uncomplicated  Subjective  The patient has the following complaints: has no unusual complaints  Pain is controlled with current medications.   Patient is urinating without difficulty.  She is ambulating well.     Objective  Vital signs: BP 113/75 (BP Location: Right Arm)   Pulse 75   Temp 98.4 F (36.9 C) (Oral)   Resp 16   Ht 5\' 6"  (1.676 m)   Wt 83.9 kg   LMP 02/02/2020   SpO2 97%   Breastfeeding Unknown   BMI 29.86 kg/m   Physical Exam: Gen: NAD Fundus Fundal Tone: Firm  Lochia Amount: Small        Data Review Labs: Lab Results  Component Value Date   WBC 11.8 (H) 11/08/2020   HGB 11.6 (L) 11/08/2020   HCT 33.6 (L) 11/08/2020   MCV 96.6 11/08/2020   PLT 169 11/08/2020   CBC Latest Ref Rng & Units 11/08/2020 08/29/2020 01/10/2019  WBC 4.0 - 10.5 K/uL 11.8(H) 8.9 12.1(H)  Hemoglobin 12.0 - 15.0 g/dL 11.6(L) 11.5 12.1  Hematocrit 36.0 - 46.0 % 33.6(L) 33.7(L) 34.9(L)  Platelets 150 - 400 K/uL 169 173 163   O NEG  Edinburgh Score: Edinburgh Postnatal Depression Scale Screening Tool 11/09/2020  I have been able to laugh and see the funny side of things. 0  I have looked forward with  enjoyment to things. 0  I have blamed myself unnecessarily when things went wrong. 1  I have been anxious or worried for no good reason. 0  I have felt scared or panicky for no good reason. 0  Things have been getting on top of me. 1  I have been so unhappy that I have had difficulty sleeping. 0  I have felt sad or miserable. 1  I have been so unhappy that I have been crying. 1  The thought of harming myself has occurred to me. 0  Edinburgh Postnatal Depression Scale Total 4    Assessment/Plan  Active Problems:   Post-dates pregnancy    Plan for discharge today.  Discharge Instructions: Per After Visit Summary. Activity: Advance as tolerated. Pelvic rest for 6 weeks.  Also refer to After Visit Summary Diet: Regular Medications: Allergies as of 11/10/2020      Reactions   Toradol [ketorolac Tromethamine] Swelling   Ketorolac Swelling      Medication List    STOP taking these medications   Doxylamine-Pyridoxine 10-10 MG Tbec Commonly known as: Diclegis     TAKE  these medications   CitraNatal Bloom 90-1 MG Tabs Take 1 tablet by mouth daily.   pantoprazole 20 MG tablet Commonly known as: PROTONIX TAKE 1 TABLET BY MOUTH TWICE DAILY      Outpatient follow up:   Follow-up Information    Linzie Collin, MD Follow up in 4 week(s).   Specialties: Obstetrics and Gynecology, Radiology Contact information: [address] [place] [address] [phone]              Postpartum contraception: Will discuss at first office visit post-partum  Discharged Condition: good  Discharged to: home  Newborn Data: Disposition:home with mother  Apgars: APGAR (1 MIN): 8   APGAR (5 MINS): 9   APGAR (10 MINS):    Baby Feeding: Bottle    Elonda Husky, M.D. 11/10/2020 9:46 AM

## 2020-11-10 NOTE — Progress Notes (Signed)
Pt discharged with infant. Discharge instructions, prescriptions, and follow up appointments given to and reviewed with patient. Pt verbalized understanding. To be escorted out by auxillary.  °

## 2020-11-10 NOTE — Discharge Instructions (Signed)
Discharge Instructions:   Follow-up Appointment:  If there are any new medications, they have been ordered and will be available for pickup at the listed pharmacy on your way home from the hospital.   Call office if you have any of the following: headache, visual changes, fever >101.0 F, chills, shortness of breath, breast concerns, excessive vaginal bleeding, incision drainage or problems, leg pain or redness, depression or any other concerns. If you have vaginal discharge with an odor, let your doctor know.   It is normal to bleed for up to 6 weeks. You should not soak through more than 1 pad in 1 hour. If you have a blood clot larger than your fist with continued bleeding, call your doctor.   Activity: Do not lift > 10 lbs for 6 weeks (do not lift anything heavier than your baby). No intercourse, tampons, swimming pools, hot tubs, baths (only showers) for 6 weeks.  No driving for 1-2 weeks. Continue prenatal vitamin, especially if breastfeeding. Increase calories and fluids (water) while breastfeeding.   Your milk will come in, in the next couple of days (right now it is colostrum). You may have a slight fever when your milk comes in, but it should go away on its own.  If it does not, and rises above 101 F please call the doctor. You will also feel achy and your breasts will be firm. They will also start to leak. If you are breastfeeding, continue as you have been and you can pump/express milk for comfort.   If you have too much milk, your breasts can become engorged, which could lead to mastitis. This is an infection of the milk ducts. It can be very painful and you will need to notify your doctor to obtain a prescription for antibiotics. You can also treat it with a shower or hot/cold compress.   For concerns about your baby, please call your pediatrician.  For breastfeeding concerns, the lactation consultant can be reached at [phone].   Postpartum blues (feelings of happy one minute  and sad another minute) are normal for the first few weeks but if it gets worse let your doctor know.   Congratulations! We enjoyed caring for you and your new bundle of joy!  Postpartum Care After Vaginal Delivery The following information offers guidance about how to care for yourself from the time you deliver your baby to 6-12 weeks after delivery (postpartum period). If you have problems or questions, contact your health care provider for more specific instructions. Follow these instructions at home: Vaginal bleeding  It is normal to have vaginal bleeding (lochia) after delivery. Wear a sanitary pad for bleeding and discharge. ? During the first week after delivery, the amount and appearance of lochia is often similar to a menstrual period. ? Over the next few weeks, it will gradually decrease to a dry, yellow-brown discharge. ? For most women, lochia stops completely by 4-6 weeks after delivery, but can vary.  Change your sanitary pads frequently. Watch for any changes in your flow, such as: ? A sudden increase in volume. ? A change in color. ? Large blood clots.  If you pass a blood clot from your vagina, save it and call your health care provider. Do not flush blood clots down the toilet before talking with your health care provider.  Do not use tampons or douches until your health care provider approves.  If you are not breastfeeding, your period should return 6-8 weeks after delivery. If you are feeding   your baby breast milk only, your period may not return until you stop breastfeeding. Perineal care  Keep the area between the vagina and the anus (perineum) clean and dry. Use medicated pads and pain-relieving sprays and creams as directed.  If you had a surgical cut in the perineum (episiotomy) or a tear, check the area for signs of infection until you are healed. Check for: ? More redness, swelling, or pain. ? Fluid or blood coming from the cut or tear. ? Warmth. ? Pus or a  bad smell.  You may be given a squirt bottle to use instead of wiping to clean the perineum area after you use the bathroom. Pat the area gently to dry it.  To relieve pain caused by an episiotomy, a tear, or swollen veins in the anus (hemorrhoids), take a warm sitz bath 2-3 times a day. In a sitz bath, the warm water should only come up to your hips and cover your buttocks.   Breast care  In the first few days after delivery, your breasts may feel heavy, full, and uncomfortable (breast engorgement). Milk may also leak from your breasts. Ask your health care provider about ways to help relieve the discomfort.  If you are breastfeeding: ? Wear a bra that supports your breasts and fits well. Use breast pads to absorb milk that leaks. ? Keep your nipples clean and dry. Apply creams and ointments as told. ? You may have uterine contractions every time you breastfeed for up to several weeks after delivery. This helps your uterus return to its normal size. ? If you have any problems with breastfeeding, notify your health care provider or lactation consultant.  If you are not breastfeeding: ? Avoid touching your breasts. Do not squeeze out (express) milk. Doing this can make your breasts produce more milk. ? Wear a good-fitting bra and use cold packs to help with swelling. Intimacy and sexuality  Ask your health care provider when you can engage in sexual activity. This may depend upon: ? Your risk of infection. ? How fast you are healing. ? Your comfort and desire to engage in sexual activity.  You are able to get pregnant after delivery, even if you have not had your period. Talk with your health care provider about methods of birth control (contraception) or family planning if you desire future pregnancies. Medicines  Take over-the-counter and prescription medicines only as told by your health care provider.  Take an over-the-counter stool softener to help ease bowel movements as told by  your health care provider.  If you were prescribed an antibiotic medicine, take it as told by your health care provider. Do not stop taking the antibiotic even if you start to feel better.  Review all previous and current prescriptions to check for possible transfer into breast milk. Activity  Gradually return to your normal activities as told by your health care provider.  Rest as much as possible. Nap while your baby is sleeping. Eating and drinking  Drink enough fluid to keep your urine pale yellow.  To help prevent or relieve constipation, eat high-fiber foods every day.  Choose healthy eating to support breastfeeding or weight loss goals.  Take your prenatal vitamins until your health care provider tells you to stop.   General tips/recommendations  Do not use any products that contain nicotine or tobacco. These products include cigarettes, chewing tobacco, and vaping devices, such as e-cigarettes. If you need help quitting, ask your health care provider.  Do not drink   alcohol, especially if you are breastfeeding.  Do not take medications or drugs that are not prescribed to you, especially if you are breastfeeding.  Visit your health care provider for a postpartum checkup within the first 3-6 weeks after delivery.  Complete a comprehensive postpartum visit no later than 12 weeks after delivery.  Keep all follow-up visits for you and your baby. Contact a health care provider if:  You feel unusually sad or worried.  Your breasts become red, painful, or hard.  You have a fever or other signs of an infection.  You have bleeding that is soaking through one pad an hour or you have blood clots.  You have a severe headache that doesn't go away or you have vision changes.  You have nausea and vomiting and are unable to eat or drink anything for 24 hours. Get help right away if:  You have chest pain or difficulty breathing.  You have sudden, severe leg pain.  You faint or  have a seizure.  You have thoughts about hurting yourself or your baby. If you ever feel like you may hurt yourself or others, or have thoughts about taking your own life, get help right away. Go to your nearest emergency department or:  Call your local emergency services (911 in the [place]  The National Suicide Prevention Lifeline at 1-800-273-8255. This suicide crisis helpline is open 24 hours a day.  Text the Crisis Text Line at 741741 (in the [place] Summary  The period of time after you deliver your newborn up to 6-12 weeks after delivery is called the postpartum period.  Keep all follow-up visits for you and your baby.  Review all previous and current prescriptions to check for possible transfer into breast milk.  Contact a health care provider if you feel unusually sad or worried during the postpartum period. This information is not intended to replace advice given to you by your health care provider. Make sure you discuss any questions you have with your health care provider. Document Revised: 03/28/2020 Document Reviewed: 03/28/2020 Elsevier Patient Education  2021 Elsevier Inc.   Postpartum Baby Blues The postpartum period begins right after the birth of a baby. During this time, there is often joy and excitement. It is also a time of many changes in the life of the parents. A mother may feel happy one minute and sad or stressed the next. These feelings of sadness, called the baby blues, usually happen in the period right after the baby is born and go away within a week or two. What are the causes? The exact cause of this condition is not known. Changes in hormone levels after childbirth are believed to trigger some of the symptoms. Other factors that can play a role in these mood changes include:  Lack of sleep.  Stressful life events, such as financial problems, caring for a loved one, or death of a loved one.  Genetics. What are the signs or symptoms? Symptoms of this  condition include:  Changes in mood, such as going from extreme happiness to sadness.  A decrease in concentration.  Difficulty sleeping.  Crying spells and tearfulness.  Loss of appetite.  Irritability.  Anxiety. If these symptoms last for more than 2 weeks or become more severe, you may have postpartum depression. How is this diagnosed? This condition is diagnosed based on an evaluation of your symptoms. Your health care provider may use a screening tool that includes a list of questions to help identify a person with   the baby blues or postpartum depression. How is this treated? The baby blues usually go away on their own in 1-2 weeks. Social support is often what is needed. You will be encouraged to get adequate sleep and rest. Follow these instructions at home: Lifestyle  Get as much rest as you can. Take a nap when the baby sleeps.  Exercise regularly as told by your health care provider. Some women find yoga and walking to be helpful.  Eat a balanced and nourishing diet. This includes plenty of fruits and vegetables, whole grains, and lean proteins.  Do little things that you enjoy. Take a bubble bath, read your favorite magazine, or listen to your favorite music.  Avoid alcohol.  Ask for help with household chores, cooking, grocery shopping, or running errands. Do not try to do everything yourself. Consider hiring a postpartum doula to help. This is a professional who specializes in providing support to new mothers.  Try not to make any major life changes during pregnancy or right after giving birth. This can add stress.      General instructions  Talk to people close to you about how you are feeling. Get support from your partner, family members, friends, or other new moms. You may want to join a support group.  Find ways to manage stress. This may include: ? Writing your thoughts and feelings in a journal. ? Spending time outside. ? Spending time with people who  make you laugh.  Try to stay positive in how you think. Think about the things you are grateful for.  Take over-the-counter and prescription medicines only as told by your health care provider.  Let your health care provider know if you have any concerns.  Keep all postpartum visits. This is important. Contact a health care provider if:  Your baby blues do not go away after 2 weeks. Get help right away if:  You have thoughts of taking your own life (suicidal thoughts), or of harming your baby or someone else.  You see or hear things that are not there (hallucinations). If you ever feel like you may hurt yourself or others, or have thoughts about taking your own life, get help right away. Go to your nearest emergency department or:  Call your local emergency services (911 in the [place]  Call a suicide crisis helpline, such as the National Suicide Prevention Lifeline, at 1-800-273-8255. This is open 24 hours a day in the [place]  Text the Crisis Text Line at 741741 (in the [place] Summary  After giving birth, you may feel happy one minute and sad or stressed the next. Feelings of sadness that happen right after the baby is born and go away after a week or two are called the baby blues.  You can manage the baby blues by getting enough rest, eating a healthy diet, exercising, spending time with supportive people, and finding ways to manage stress.  If feelings of sadness and stress last longer than 2 weeks or get in the way of caring for your baby, talk with your health care provider. This may mean you have postpartum depression. This information is not intended to replace advice given to you by your health care provider. Make sure you discuss any questions you have with your health care provider. Document Revised: 01/05/2020 Document Reviewed: 01/05/2020 Elsevier Patient Education  2021 Elsevier Inc.  

## 2020-11-11 ENCOUNTER — Telehealth: Payer: Self-pay | Admitting: *Deleted

## 2020-11-11 NOTE — Telephone Encounter (Signed)
Transition Care Management Follow-up Telephone Call  Date of discharge and from where: 11/10/2020 [facility]   How have you been since you were released from the hospital? "doing well"  Any questions or concerns? No  Items Reviewed:  Did the pt receive and understand the discharge instructions provided? Yes   Medications obtained and verified? Yes   Other? No   Any new allergies since your discharge? No   Dietary orders reviewed? No  Do you have support at home? Yes    Functional Questionnaire: (I = Independent and D = Dependent) ADLs: I  Bathing/Dressing- I  Meal Prep- I  Eating- I  Maintaining continence- I  Transferring/Ambulation- I  Managing Meds- I  Follow up appointments reviewed:   PCP Hospital f/u appt confirmed? No    Specialist Hospital f/u appt confirmed? Yes - Scheduled to see OBGYN on 12/09/2020 at 0915.  Are transportation arrangements needed? No   If their condition worsens, is the pt aware to call PCP or go to the Emergency Dept.? Yes  Was the patient provided with contact information for the PCP's office or ED? Yes  Was to pt encouraged to call back with questions or concerns? Yes

## 2020-11-12 ENCOUNTER — Encounter: Payer: Medicaid Other | Admitting: Obstetrics and Gynecology

## 2020-11-12 ENCOUNTER — Other Ambulatory Visit: Payer: Medicaid Other

## 2020-11-19 ENCOUNTER — Telehealth: Payer: Self-pay | Admitting: Obstetrics and Gynecology

## 2020-11-19 NOTE — Telephone Encounter (Signed)
It is in the patients chart under Media. You just need to print it out.

## 2020-11-19 NOTE — Telephone Encounter (Signed)
Janet Muir, Do you still have the copies of this pt's fmla forms? [name]

## 2020-11-19 NOTE — Telephone Encounter (Signed)
Janet Coleman called in and states she needs a copy of her FMLA paper work.  I did inform patient that [place] faxed the FMLA paper work.  Janet Coleman would like to have a copy for her records.  She will like to be notified when the copies are available.

## 2020-11-21 ENCOUNTER — Other Ambulatory Visit: Payer: Self-pay

## 2020-11-21 ENCOUNTER — Ambulatory Visit (INDEPENDENT_AMBULATORY_CARE_PROVIDER_SITE_OTHER): Payer: Medicaid Other | Admitting: Obstetrics and Gynecology

## 2020-11-21 ENCOUNTER — Encounter: Payer: Self-pay | Admitting: Obstetrics and Gynecology

## 2020-11-21 VITALS — BP 128/75 | HR 69 | Ht 66.0 in | Wt 174.1 lb

## 2020-11-21 DIAGNOSIS — F53 Postpartum depression: Secondary | ICD-10-CM | POA: Diagnosis not present

## 2020-11-21 DIAGNOSIS — O99345 Other mental disorders complicating the puerperium: Secondary | ICD-10-CM

## 2020-11-21 MED ORDER — SERTRALINE HCL 50 MG PO TABS: 50.0000 mg | ORAL_TABLET | Freq: Every day | ORAL | 1 refills | Status: DC

## 2020-11-21 NOTE — Progress Notes (Signed)
HPI:      [name] is a 35 y.o. [id number] who LMP was No LMP recorded.  Subjective:   She presents today stating that she has begun to cry several times a day and feels very down and depressed.  She says that this is not like her.  She is bottlefeeding.  She states that she sleeps 5 to 6 hours per night and does have some help during the day with her chores and care of the baby.  She just says that she is an accountability sad. She says that when she was 13 she was placed on an antidepressant medication for 1 year.    Hx: The following portions of the patient's history were reviewed and updated as appropriate:             She  has a past medical history of GERD (gastroesophageal reflux disease), Hypertension, Hypothyroidism, Localized morphea, Migraines, Reactive hypoglycemia, Reactive hypoglycemia, Tobacco use, and Vaginal Pap smear, abnormal. She does not have any pertinent problems on file. She  has a past surgical history that includes Dilation and curettage of uterus; LEEP; and Blepharoptosis repair. Her family history includes Aneurysm in her father; Autoimmune disease in her mother; Cancer in her maternal aunt and maternal grandmother; Diabetes in her maternal grandmother and paternal grandmother; Heart attack in her father; Heart disease in her father and paternal grandmother; Hypertension in her father and mother; Stroke in her father and maternal grandfather. She  reports that she has been smoking cigarettes. She has a 4.25 pack-year smoking history. She has never used smokeless tobacco. She reports previous alcohol use. She reports that she does not use drugs. She has a current medication list which includes the following prescription(s): citranatal bloom, sertraline, and pantoprazole. She is allergic to toradol [ketorolac tromethamine] and ketorolac.       Review of Systems:  Review of Systems  Constitutional: Denied constitutional symptoms, night sweats, recent illness,  fatigue, fever, insomnia and weight loss.  Eyes: Denied eye symptoms, eye pain, photophobia, vision change and visual disturbance.  Ears/Nose/Throat/Neck: Denied ear, nose, throat or neck symptoms, hearing loss, nasal discharge, sinus congestion and sore throat.  Cardiovascular: Denied cardiovascular symptoms, arrhythmia, chest pain/pressure, edema, exercise intolerance, orthopnea and palpitations.  Respiratory: Denied pulmonary symptoms, asthma, pleuritic pain, productive sputum, cough, dyspnea and wheezing.  Gastrointestinal: Denied, gastro-esophageal reflux, melena, nausea and vomiting.  Genitourinary: Denied genitourinary symptoms including symptomatic vaginal discharge, pelvic relaxation issues, and urinary complaints.  Musculoskeletal: Denied musculoskeletal symptoms, stiffness, swelling, muscle weakness and myalgia.  Dermatologic: Denied dermatology symptoms, rash and scar.  Neurologic: Denied neurology symptoms, dizziness, headache, neck pain and syncope.  Psychiatric: See HPI for additional information.  Endocrine: Denied endocrine symptoms including hot flashes and night sweats.   Meds:   Current Outpatient Medications on File Prior to Visit  Medication Sig Dispense Refill  . Prenatal-DSS-FeCb-FeGl-FA (CITRANATAL BLOOM) 90-1 MG TABS Take 1 tablet by mouth daily. 30 tablet 11  . pantoprazole (PROTONIX) 20 MG tablet TAKE 1 TABLET BY MOUTH TWICE DAILY (Patient not taking: Reported on 11/21/2020) 60 tablet 1   No current facility-administered medications on file prior to visit.          Objective:     Vitals:   11/21/20 1140  BP: 128/75  Pulse: 69   Filed Weights   11/21/20 1140  Weight: 174 lb 1.6 oz (79 kg)              P HQ-9 = 8  GAD-7 = 6  Assessment:    [id number] Patient Active Problem List   Diagnosis Date Noted  . Post-dates pregnancy 11/08/2020  . Supervision of elderly multigravida (>=70 years old at time of delivery), second trimester 05/22/2020   . Premature rupture of membranes 01/10/2019  . Back pain in pregnancy 12/20/2018  . Rh negative state in antepartum period 06/28/2018  . Elevated cortisol level 02/21/2015  . Hypoglycemia 02/08/2015  . GERD (gastroesophageal reflux disease)   . Hypertension   . Hypothyroidism   . Tobacco use   . Migraines      1. Depression affecting pregnancy, postpartum     Patient seems significantly affected by sadness/depression postpartum.   Plan:            1.  Begin Zoloft.  Patient instructed in its use.  Plan follow-up at 4-week visit.  Patient instructed to call sooner if she is no better or becoming worse despite the medication.  Orders No orders of the defined types were placed in this encounter.    Meds ordered this encounter  Medications  . sertraline (ZOLOFT) 50 MG tablet    Sig: Take 1 tablet (50 mg total) by mouth daily.    Dispense:  30 tablet    Refill:  1      F/U  No follow-ups on file. I spent 20 minutes involved in the care of this patient preparing to see the patient by obtaining and reviewing her medical history (including labs, imaging tests and prior procedures), documenting clinical information in the electronic health record (EHR), counseling and coordinating care plans, writing and sending prescriptions, ordering tests or procedures and directly communicating with the patient by discussing pertinent items from her history and physical exam as well as detailing my assessment and plan as noted above so that she has an informed understanding.  All of her questions were answered.  Elonda Husky, M.D. 11/21/2020 12:03 PM

## 2020-11-22 ENCOUNTER — Encounter: Payer: Medicaid Other | Admitting: Obstetrics and Gynecology

## 2020-11-22 NOTE — Telephone Encounter (Signed)
Pt came by the office and picked up copies this week. Janet Coleman

## 2020-12-09 ENCOUNTER — Encounter: Payer: Self-pay | Admitting: Obstetrics and Gynecology

## 2020-12-09 ENCOUNTER — Other Ambulatory Visit: Payer: Self-pay

## 2020-12-09 ENCOUNTER — Ambulatory Visit (INDEPENDENT_AMBULATORY_CARE_PROVIDER_SITE_OTHER): Payer: Medicaid Other | Admitting: Obstetrics and Gynecology

## 2020-12-09 ENCOUNTER — Encounter: Payer: Self-pay | Admitting: Surgical

## 2020-12-09 DIAGNOSIS — F53 Postpartum depression: Secondary | ICD-10-CM

## 2020-12-09 DIAGNOSIS — O99345 Other mental disorders complicating the puerperium: Secondary | ICD-10-CM

## 2020-12-09 MED ORDER — SERTRALINE HCL 50 MG PO TABS: 50.0000 mg | ORAL_TABLET | Freq: Every day | ORAL | 1 refills | Status: DC

## 2020-12-09 MED ORDER — SERTRALINE HCL 50 MG PO TABS: 100.0000 mg | ORAL_TABLET | Freq: Every day | ORAL | 1 refills | Status: DC

## 2020-12-09 MED ORDER — NORETHIN ACE-ETH ESTRAD-FE 1.5-30 MG-MCG PO TABS: 1.0000 | ORAL_TABLET | Freq: Every day | ORAL | 3 refills | Status: DC

## 2020-12-09 NOTE — Addendum Note (Signed)
Addended by: Elonda Husky on: 12/09/2020 09:57 AM   Modules accepted: Orders

## 2020-12-09 NOTE — Progress Notes (Signed)
HPI:      [name] is a 35 y.o. [id number] who LMP was No LMP recorded.  Subjective:   She presents today approximately 4 weeks postpartum.  She initially presented 3 weeks ago with complaint of significant postpartum depression.  At that time she was begun on Zoloft.  She states that has made a dramatic difference but that she still has some "bad" days.  There are still some days when she does not want to get out of bed. She is bottlefeeding. She has not resumed intercourse-she would like to use OCPs for birth control. She has been speaking with her work about returning -wants them to give her light duty for a while.    Hx: The following portions of the patient's history were reviewed and updated as appropriate:             She  has a past medical history of GERD (gastroesophageal reflux disease), Hypertension, Hypothyroidism, Localized morphea, Migraines, Reactive hypoglycemia, Reactive hypoglycemia, Tobacco use, and Vaginal Pap smear, abnormal. She does not have any pertinent problems on file. She  has a past surgical history that includes Dilation and curettage of uterus; LEEP; and Blepharoptosis repair. Her family history includes Aneurysm in her father; Autoimmune disease in her mother; Cancer in her maternal aunt and maternal grandmother; Diabetes in her maternal grandmother and paternal grandmother; Heart attack in her father; Heart disease in her father and paternal grandmother; Hypertension in her father and mother; Stroke in her father and maternal grandfather. She  reports that she has been smoking cigarettes. She has a 4.25 pack-year smoking history. She has never used smokeless tobacco. She reports previous alcohol use. She reports that she does not use drugs. She has a current medication list which includes the following prescription(s): norethindrone-ethinyl estradiol-iron, citranatal bloom, sertraline, sertraline, and pantoprazole. She is allergic to toradol [ketorolac  tromethamine] and ketorolac.       Review of Systems:  Review of Systems  Constitutional: Denied constitutional symptoms, night sweats, recent illness, fatigue, fever, insomnia and weight loss.  Eyes: Denied eye symptoms, eye pain, photophobia, vision change and visual disturbance.  Ears/Nose/Throat/Neck: Denied ear, nose, throat or neck symptoms, hearing loss, nasal discharge, sinus congestion and sore throat.  Cardiovascular: Denied cardiovascular symptoms, arrhythmia, chest pain/pressure, edema, exercise intolerance, orthopnea and palpitations.  Respiratory: Denied pulmonary symptoms, asthma, pleuritic pain, productive sputum, cough, dyspnea and wheezing.  Gastrointestinal: Denied, gastro-esophageal reflux, melena, nausea and vomiting.  Genitourinary: Denied genitourinary symptoms including symptomatic vaginal discharge, pelvic relaxation issues, and urinary complaints.  Musculoskeletal: Denied musculoskeletal symptoms, stiffness, swelling, muscle weakness and myalgia.  Dermatologic: Denied dermatology symptoms, rash and scar.  Neurologic: Denied neurology symptoms, dizziness, headache, neck pain and syncope.  Psychiatric: Denied psychiatric symptoms, anxiety and depression.  Endocrine: Denied endocrine symptoms including hot flashes and night sweats.   Meds:   Current Outpatient Medications on File Prior to Visit  Medication Sig Dispense Refill  . Prenatal-DSS-FeCb-FeGl-FA (CITRANATAL BLOOM) 90-1 MG TABS Take 1 tablet by mouth daily. 30 tablet 11  . sertraline (ZOLOFT) 50 MG tablet Take 1 tablet (50 mg total) by mouth daily. 30 tablet 1  . pantoprazole (PROTONIX) 20 MG tablet TAKE 1 TABLET BY MOUTH TWICE DAILY (Patient not taking: No sig reported) 60 tablet 1   No current facility-administered medications on file prior to visit.      The pregnancy intention screening data noted above was reviewed. Potential methods of contraception were discussed. The patient elected to proceed with  Oral Contraceptive.     Objective:     Vitals:   12/09/20 0926  BP: 135/88  Pulse: (!) 54   Filed Weights   12/09/20 0926  Weight: 176 lb 1.6 oz (79.9 kg)              Physical examination   Pelvic:   Vulva: Normal appearance.  No lesions.  Vagina: No lesions or abnormalities noted.  Support: Normal pelvic support.  Urethra No masses tenderness or scarring.  Meatus Normal size without lesions or prolapse.  Cervix: Normal appearance.  No lesions.  Anus: Normal exam.  No lesions.  Perineum: Normal exam.  No lesions.        Bimanual   Uterus: Normal size.  Non-tender.  Mobile.  AV.  Adnexae: No masses.  Non-tender to palpation.  Cul-de-sac: Negative for abnormality.     Assessment:    [id number] Patient Active Problem List   Diagnosis Date Noted  . Post-dates pregnancy 11/08/2020  . Supervision of elderly multigravida (>=59 years old at time of delivery), second trimester 05/22/2020  . Premature rupture of membranes 01/10/2019  . Back pain in pregnancy 12/20/2018  . Rh negative state in antepartum period 06/28/2018  . Elevated cortisol level 02/21/2015  . Hypoglycemia 02/08/2015  . GERD (gastroesophageal reflux disease)   . Hypertension   . Hypothyroidism   . Tobacco use   . Migraines      1. Postpartum care and examination immediately after delivery   2. Depression affecting pregnancy, postpartum     Patient much improved from her last visit for postpartum depression.  Still has some issues.   Plan:            1.  Increase Zoloft to 100 mg daily   2.  OCPs  The risks /benefits of OCPs have been explained to the patient in detail.  Product literature has been given to her where appropriate.  I have instructed her in the use of OCPs.  I have explained to the patient that OCPs are not as effective for birth control during the first month of use, and that another form of contraception should be used during this time.  Both first-day start and Sunday start have  been explained.  The risks and benefits of each was discussed.  She has been made aware of  the fact that in rare circumstances, other medications may affect the efficacy of OCPs.  I have answered all of her questions, and I believe that she has an understanding of the effectiveness and use of OCPs. Patient is very interested in a cycle control pill.  We will start Loestrin 1.5/30.  3.  Recommend counseling in addition to medication for her postpartum depression.-Literature given referral to [facility] [place]. Orders No orders of the defined types were placed in this encounter.    Meds ordered this encounter  Medications  . sertraline (ZOLOFT) 50 MG tablet    Sig: Take 1 tablet (50 mg total) by mouth daily.    Dispense:  30 tablet    Refill:  1  . norethindrone-ethinyl estradiol-iron (LOESTRIN FE 1.5/30) 1.5-30 MG-MCG tablet    Sig: Take 1 tablet by mouth at bedtime for 28 days.    Dispense:  28 tablet    Refill:  3      F/U  Return in about 4 weeks (around 01/06/2021).  Elonda Husky, M.D. 12/09/2020 9:50 AM

## 2020-12-13 ENCOUNTER — Other Ambulatory Visit: Payer: Self-pay | Admitting: Obstetrics and Gynecology

## 2020-12-13 DIAGNOSIS — F53 Postpartum depression: Secondary | ICD-10-CM

## 2020-12-13 DIAGNOSIS — O99345 Other mental disorders complicating the puerperium: Secondary | ICD-10-CM

## 2020-12-24 ENCOUNTER — Telehealth: Payer: Self-pay | Admitting: Obstetrics and Gynecology

## 2020-12-24 NOTE — Telephone Encounter (Signed)
Janet Coleman called in and states that she needs a nurse to call her so she can talk to them about needing a letter or doctor note from Dr. Logan Bores.  She states that she was released to go back to work on 5/17.  When she contacted her HR dept they never called her back.  They called her back today and told her they needed a note from the doctor excusing her from the 17th until today.  Please advise.

## 2020-12-24 NOTE — Telephone Encounter (Signed)
Spoke with patient and she said that her HR department is wanting a note putting her out of work from the May 16th through May 26th. Dr. Logan Bores released the patinet to go back to full duty on May 17th. I have called and left message for Janet Coleman at [employer] office at [phone] to return my call ASAP to find out why they are wanting patient to get another note to be out of work when she was already released to go back to work. I have also left a message for Janet Coleman at the local [employer] at [phone] to return call ASAP. I am waiting for return calls.

## 2020-12-24 NOTE — Telephone Encounter (Signed)
Spoke with patient and she is going back to work this week.

## 2020-12-24 NOTE — Telephone Encounter (Signed)
Spoke with Candy at [employer]. I explained that we had released Chesney to return back to work on May 17th. I explained that we can not write a note to be out since we had released her to go back to work. I told her that I do not understand why the patient is being penalized since she turned in her note to return to work. Candy stated that she would have to contact the administrator at the local office to find out what is going on. I told her that the patient stated that she had been calling [employer] to go back to work but was unable to get anyone.

## 2021-01-07 ENCOUNTER — Encounter: Payer: Medicaid Other | Admitting: Obstetrics and Gynecology

## 2021-01-08 ENCOUNTER — Telehealth: Payer: Self-pay | Admitting: Nurse Practitioner

## 2021-01-08 NOTE — Telephone Encounter (Signed)
Pt called in wanting to immunization records for a new job. Please advise

## 2021-01-08 NOTE — Telephone Encounter (Signed)
Called pt advised records are ready to be picked up

## 2021-01-22 ENCOUNTER — Encounter: Payer: Self-pay | Admitting: Obstetrics and Gynecology

## 2021-01-22 ENCOUNTER — Other Ambulatory Visit: Payer: Self-pay

## 2021-01-22 ENCOUNTER — Ambulatory Visit (INDEPENDENT_AMBULATORY_CARE_PROVIDER_SITE_OTHER): Payer: Medicaid Other | Admitting: Obstetrics and Gynecology

## 2021-01-22 VITALS — BP 126/82 | HR 70 | Ht 66.0 in | Wt 179.0 lb

## 2021-01-22 DIAGNOSIS — F53 Postpartum depression: Secondary | ICD-10-CM

## 2021-01-22 DIAGNOSIS — Z111 Encounter for screening for respiratory tuberculosis: Secondary | ICD-10-CM

## 2021-01-22 DIAGNOSIS — O99345 Other mental disorders complicating the puerperium: Secondary | ICD-10-CM

## 2021-01-22 MED ORDER — SERTRALINE HCL 50 MG PO TABS: 100.0000 mg | ORAL_TABLET | Freq: Every day | ORAL | 3 refills | Status: DC

## 2021-01-22 MED ORDER — NORETHIN ACE-ETH ESTRAD-FE 1.5-30 MG-MCG PO TABS: 1.0000 | ORAL_TABLET | Freq: Every day | ORAL | 11 refills | Status: DC

## 2021-01-22 NOTE — Addendum Note (Signed)
Addended by: Dorian Pod on: 01/22/2021 09:16 AM   Modules accepted: Orders

## 2021-01-22 NOTE — Progress Notes (Signed)
HPI:      Janet Coleman is a 35 y.o. [id number] who LMP was No LMP recorded.  Subjective:   She presents today for follow-up of her Zoloft for postpartum depression.  She says that she is much better.  She notes that the increased dose is working better for her.  She is also taking OCPs without problem. 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    Hx: The following portions of the patient's history were reviewed and updated as appropriate:             She  has a past medical history of GERD (gastroesophageal reflux disease), Hypertension, Hypothyroidism, Localized morphea, Migraines, Reactive hypoglycemia, Reactive hypoglycemia, Tobacco use, and Vaginal Pap smear, abnormal. She does not have any pertinent problems on file. She  has a past surgical history that includes Dilation and curettage of uterus; LEEP; and Blepharoptosis repair. Her family history includes Aneurysm in her father; Autoimmune disease in her mother; Cancer in her maternal aunt and maternal grandmother; Diabetes in her maternal grandmother and paternal grandmother; Heart attack in her father; Heart disease in her father and paternal grandmother; Hypertension in her father and mother; Stroke in her father and maternal grandfather. She  reports that she has been smoking cigarettes. She has a 4.25 pack-year smoking history. She has never used smokeless tobacco. She reports previous alcohol use. She reports that she does not use drugs. She has a current medication list which includes the following prescription(s): citranatal bloom, norethindrone-ethinyl estradiol-iron, and sertraline. She is allergic to toradol [ketorolac tromethamine] and ketorolac.       Review of Systems:  Review of Systems  Constitutional: Denied constitutional symptoms, night sweats, recent illness, fatigue, fever, insomnia and weight loss.  Eyes: Denied eye symptoms, eye pain,  photophobia, vision change and visual disturbance.  Ears/Nose/Throat/Neck: Denied ear, nose, throat or neck symptoms, hearing loss, nasal discharge, sinus congestion and sore throat.  Cardiovascular: Denied cardiovascular symptoms, arrhythmia, chest pain/pressure, edema, exercise intolerance, orthopnea and palpitations.  Respiratory: Denied pulmonary symptoms, asthma, pleuritic pain, productive sputum, cough, dyspnea and wheezing.  Gastrointestinal: Denied, gastro-esophageal reflux, melena, nausea and vomiting.  Genitourinary: Denied genitourinary symptoms including symptomatic vaginal discharge, pelvic relaxation issues, and urinary complaints.  Musculoskeletal: Denied musculoskeletal symptoms, stiffness, swelling, muscle weakness and myalgia.  Dermatologic: Denied dermatology symptoms, rash and scar.  Neurologic: Denied neurology symptoms, dizziness, headache, neck pain and syncope.  Psychiatric: See HPI for additional information.  Endocrine: Denied endocrine symptoms including hot flashes and night sweats.   Meds:   Current Outpatient Medications on File Prior to Visit  Medication Sig Dispense Refill   Prenatal-DSS-FeCb-FeGl-FA (CITRANATAL BLOOM) 90-1 MG TABS Take 1 tablet by mouth daily. 30 tablet 11   No current facility-administered medications on file prior to visit.        The pregnancy intention screening data noted above was reviewed. Potential methods of contraception were discussed. The patient elected to proceed with Oral Contraceptive.     Objective:     Vitals:   01/22/21 0844  BP: 126/82  Pulse: 70   Filed Weights   01/22/21 0844  Weight: 179 lb (81.2 kg)               PHQ-9-2 GAD-7-1  Assessment:    [id number] Patient Active Problem List   Diagnosis Date Noted   Post-dates pregnancy 11/08/2020   Supervision of elderly multigravida (>=29 years old  at time of delivery), second trimester 05/22/2020   Premature rupture of membranes 01/10/2019   Back pain  in pregnancy 12/20/2018   Rh negative state in antepartum period 06/28/2018   Elevated cortisol level 02/21/2015   Hypoglycemia 02/08/2015   GERD (gastroesophageal reflux disease)    Hypertension    Hypothyroidism    Tobacco use    Migraines      1. Depression affecting pregnancy, postpartum   2. Postpartum care and examination immediately after delivery     Patient doing well on 100 mg of Zoloft.   Plan:            1.  Continue Zoloft  2.  Continue OCPs  3.  Follow-up for annual examination in 3 months. Orders No orders of the defined types were placed in this encounter.    Meds ordered this encounter  Medications   sertraline (ZOLOFT) 50 MG tablet    Sig: Take 2 tablets (100 mg total) by mouth daily.    Dispense:  60 tablet    Refill:  3   norethindrone-ethinyl estradiol-iron (LOESTRIN FE 1.5/30) 1.5-30 MG-MCG tablet    Sig: Take 1 tablet by mouth at bedtime for 28 days.    Dispense:  28 tablet    Refill:  11      F/U  Return in about 3 months (around 04/24/2021) for Annual Physical. I spent 22 minutes involved in the care of this patient preparing to see the patient by obtaining and reviewing her medical history (including labs, imaging tests and prior procedures), documenting clinical information in the electronic health record (EHR), counseling and coordinating care plans, writing and sending prescriptions, ordering tests or procedures and directly communicating with the patient by discussing pertinent items from her history and physical exam as well as detailing my assessment and plan as noted above so that she has an informed understanding.  All of her questions were answered.  Janet Coleman, M.D. 01/22/2021 9:11 AM

## 2021-01-24 ENCOUNTER — Encounter: Payer: Self-pay | Admitting: Obstetrics and Gynecology

## 2021-01-26 LAB — QUANTIFERON-TB GOLD PLUS
QuantiFERON Mitogen Value: >10 IU/mL
QuantiFERON Nil Value: 0 IU/mL
QuantiFERON TB1 Ag Value: 0 IU/mL
QuantiFERON TB2 Ag Value: 0 IU/mL
QuantiFERON-TB Gold Plus: NEGATIVE

## 2021-01-29 ENCOUNTER — Telehealth: Payer: Self-pay | Admitting: Obstetrics and Gynecology

## 2021-01-29 NOTE — Telephone Encounter (Signed)
Patient called requesting to be scheduled for flu shot- I made her aware we currently do not have any in office- should we schedule for future or direct pt elsewhere. Also pt is requesting recent TB results. Please Advise.

## 2021-01-30 NOTE — Telephone Encounter (Signed)
Spoke with patient and gave TB results. Put a copy up front for her to pick up.

## 2021-02-18 ENCOUNTER — Telehealth: Payer: Self-pay

## 2021-02-18 NOTE — Telephone Encounter (Signed)
Copied from CRM #[id number]. Topic: General - Other >> Feb 18, 2021  1:34 PM Todd, Faith N wrote: Reason for CRM: Pt called in wanting to get all of her immunization records. 

## 2021-02-18 NOTE — Telephone Encounter (Signed)
Copied from CRM [id number]. Topic: General - Other >> Feb 18, 2021  1:34 PM Traci Sermon wrote: Reason for CRM: Pt called in wanting to get all of her immunization records.

## 2021-02-18 NOTE — Telephone Encounter (Signed)
Printed copy of immunization record, placed in complete bin ready for patient pick-up. Patient aware.

## 2021-02-20 ENCOUNTER — Other Ambulatory Visit: Payer: Self-pay | Admitting: *Deleted

## 2021-02-20 ENCOUNTER — Other Ambulatory Visit: Payer: Self-pay

## 2021-02-20 NOTE — Patient Instructions (Signed)
Visit Information  [name] was given information about Medicaid Managed Care team care coordination services as a part of their [facility] Community Plan Medicaid benefit. [name] verbally consented to engagement with the [facility] Managed Care team.   If you are experiencing a medical emergency, please call 911 or report to your local emergency department or urgent care.   If you have a non-emergency medical problem during routine business hours, please contact your provider's office and ask to speak with a nurse.   For questions related to your [facility], please call: [phone] or visit the homepage here: [email or web address]  If you would like to schedule transportation through your [facility], please call the following number at least 2 days in advance of your appointment: [phone].   Call the Behavioral Health Crisis Line at [phone], at any time, 24 hours a day, 7 days a week. If you are in danger or need immediate medical attention call 911.  If you would like help to quit smoking, call 1-800-QUIT-NOW (380-492-6920) OR Espaol: 1-855-Djelo-Ya (3-570-177-9390) o para ms informacin haga clic aqu or Text READY to 300-923 to register via text  Janet Coleman - following are the goals we discussed in your visit today:   Goals Addressed             This Visit's Progress    Make and Keep All Appointments       Timeframe:  Long-Range Goal Priority:  High Start Date:  02/20/21                           Expected End Date:  04/23/21                     Follow Up Date 03/19/21    - schedule a PCP annual visit to discuss hypoglycemic episodes, plan of care, and new referral to Endocrinology - keep a calendar with appointment dates    Why is this important?   Part of staying healthy is seeing the doctor for follow-up care.  If you forget your  appointments, there are some things you can do to stay on track.            Please see education materials related to hypoglycemia provided by MyChart link.  Patient verbalizes understanding of instructions provided today.   Telephone follow up appointment with Managed Medicaid care management team member scheduled for:03/19/21 @ 1pm  Janet Emms RN, BSN [place]  [facility] RN Care Coordinator   Following is a copy of your plan of care:  Patient Care Plan: General Plan of Care (Adult)     Problem Identified: Health Promotion or Disease Self-Management (General Plan of Care)      Long-Range Goal: Self-Management Plan Developed   Start Date: 02/20/2021  Expected End Date: 04/23/2021  This Visit's Progress: On track  Priority: High  Note:   Current Barriers:  Ineffective Self Health Maintenance-[name] has been managing hypoglycemia since 2016. She had a consult with [facility] 03/2015, no follow up. She is concerned as she becomes weak and forgetful at different times during the day. She eats three meals a day and snacks regularly on peanuts and cashews. She has not seen the PCP since 2021. Unable to independently manage hypoglycemia Currently UNABLE TO independently self manage needs related to chronic health conditions.  Knowledge Deficits related to short term plan for care  coordination needs and long term plans for chronic disease management needs Nurse Case Manager Clinical Goal(s):  patient will work with care management team to address care coordination and chronic disease management needs related to Disease Management   Interventions:  Evaluation of current treatment plan related to hypoglycemia and patient's adherence to plan as established by provider. Advised patient to follow up with PCP, discuss referral to Endocrinology Provided education to patient re: hypoglycemia Reviewed scheduled/upcoming provider appointments Self Care  Activities:  Patient will attend all scheduled provider appointments Patient will call provider office for new concerns or questions Patient Goals: - schedule a PCP annual visit to discuss hypoglycemic episodes, plan of care, and new referral to Endocrinology - keep a calendar with appointment dates  Follow Up Plan: Telephone follow up appointment with care management team member scheduled for:03/19/21 @ 1pm

## 2021-02-20 NOTE — Patient Outreach (Signed)
Medicaid Managed Care   Nurse Care Manager Note  02/20/2021 Name:  Janet Coleman MRN:  [id number] DOB:  [birth year]  Mariadelcarmen A Bufford is an 35 y.o. year old female who is a primary patient of Cannady, Corrie Dandy T, NP.  The [facility] Managed Care Coordination team was consulted for assistance with:    hypoglycemia  Ms. Caputi was given information about Medicaid Managed Care Coordination team services today. Viki A Hofstra agreed to services and verbal consent obtained.  Engaged with patient by telephone for initial visit in response to provider referral for case management and/or care coordination services.   Assessments/Interventions:  Review of past medical history, allergies, medications, health status, including review of consultants reports, laboratory and other test data, was performed as part of comprehensive evaluation and provision of chronic care management services.  SDOH (Social Determinants of Health) assessments and interventions performed:   Care Plan  Allergies  Allergen Reactions   Toradol [Ketorolac Tromethamine] Swelling   Ketorolac Swelling    Medications Reviewed Today     Reviewed by Linzie Collin, MD (Physician) on 01/22/21 at 0855  Med List Status: <None>   Medication Order Taking? Sig Documenting Provider Last Dose Status Informant  norethindrone-ethinyl estradiol-iron (LOESTRIN FE 1.5/30) 1.5-30 MG-MCG tablet [id number]  Take 1 tablet by mouth at bedtime for 28 days. Linzie Collin, MD  Active   Prenatal-DSS-FeCb-FeGl-FA [facility] BLOOM) 90-1 MG TABS [id number] Yes Take 1 tablet by mouth daily. Linzie Collin, MD Taking Active   sertraline (ZOLOFT) 50 MG tablet [id number]  Take 2 tablets (100 mg total) by mouth daily. Linzie Collin, MD  Active             Patient Active Problem List   Diagnosis Date Noted   Post-dates pregnancy 11/08/2020   Supervision of elderly multigravida (>=2 years old at time of delivery), second trimester  05/22/2020   Premature rupture of membranes 01/10/2019   Back pain in pregnancy 12/20/2018   Rh negative state in antepartum period 06/28/2018   Elevated cortisol level 02/21/2015   Hypoglycemia 02/08/2015   GERD (gastroesophageal reflux disease)    Hypertension    Hypothyroidism    Tobacco use    Migraines     Conditions to be addressed/monitored per PCP order:   hypoglycemia  Care Plan : General Plan of Care (Adult)  Updates made by Heidi Dach, RN since 02/20/2021 12:00 AM     Problem: Health Promotion or Disease Self-Management (General Plan of Care)      Long-Range Goal: Self-Management Plan Developed   Start Date: 02/20/2021  Expected End Date: 04/23/2021  This Visit's Progress: On track  Priority: High  Note:   Current Barriers:  Ineffective Self Health Maintenance-Ms. Worden has been managing hypoglycemia since 2016. She had a consult with [facility] 03/2015, no follow up. She is concerned as she becomes weak and forgetful at different times during the day. She eats three meals a day and snacks regularly on peanuts and cashews. She has not seen the PCP since 2021. Unable to independently manage hypoglycemia Currently UNABLE TO independently self manage needs related to chronic health conditions.  Knowledge Deficits related to short term plan for care coordination needs and long term plans for chronic disease management needs Nurse Case Manager Clinical Goal(s):  patient will work with care management team to address care coordination and chronic disease management needs related to Disease Management   Interventions:  Evaluation of current treatment plan related to hypoglycemia and  patient's adherence to plan as established by provider. Advised patient to follow up with PCP, discuss referral to Endocrinology Provided education to patient re: hypoglycemia Reviewed scheduled/upcoming provider appointments Self Care Activities:  Patient will attend all scheduled  provider appointments Patient will call provider office for new concerns or questions Patient Goals: - schedule a PCP annual visit to discuss hypoglycemic episodes, plan of care, and new referral to Endocrinology - keep a calendar with appointment dates  Follow Up Plan: Telephone follow up appointment with care management team member scheduled for:03/19/21 @ 1pm      Follow Up:  Patient agrees to Care Plan and Follow-up.  Plan: The Managed Medicaid care management team will reach out to the patient again over the next 21 days.  Date/time of next scheduled RN care management/care coordination outreach:  03/19/21 @ 1pm  [name] RN, BSN [place]  [facility]

## 2021-03-19 ENCOUNTER — Other Ambulatory Visit: Payer: Self-pay | Admitting: *Deleted

## 2021-03-19 NOTE — Patient Instructions (Signed)
Visit Information  Ms. Grae A Liberto  - as a part of your Medicaid benefit, you are eligible for care management and care coordination services at no cost or copay. I was unable to reach you by phone today but would be happy to help you with your health related needs. Please feel free to call me @ [phone].   A member of the Managed Medicaid care management team will reach out to you again over the next 21 days.   [name] RN, BSN [place]  [facility]

## 2021-03-19 NOTE — Patient Outreach (Signed)
Care Coordination  03/19/2021  Janet Coleman November 21, 1985 [id number]    Medicaid Managed Care   Unsuccessful Outreach Note  03/19/2021 Name: Janet Coleman MRN: [id number] DOB: 06-07-1986  Referred by: Marjie Skiff, NP Reason for referral : High Risk Managed Medicaid (Unsuccessful RNCM follow up outreach)   An unsuccessful telephone outreach was attempted today. The patient was referred to the case management team for assistance with care management and care coordination.   Follow Up Plan: A HIPAA compliant phone message was left for the patient providing contact information and requesting a return call.   [name] RN, BSN [place]  [facility]

## 2021-04-04 ENCOUNTER — Other Ambulatory Visit: Payer: Self-pay | Admitting: *Deleted

## 2021-04-04 NOTE — Patient Instructions (Signed)
Visit Information  Janet Coleman  - as a part of your Medicaid benefit, you are eligible for care management and care coordination services at no cost or copay. I was unable to reach you by phone today but would be happy to help you with your health related needs. Please feel free to call me @ [phone].   A member of the Managed Medicaid care management team will reach out to you again over the next 14 days.   [name] RN, BSN [place]  [facility]

## 2021-04-04 NOTE — Patient Outreach (Signed)
Care Coordination  04/04/2021  Janet Coleman [birth year] [id number]   Medicaid Managed Care   Unsuccessful Outreach Note  04/04/2021 Name: Janet Coleman MRN: [id number] DOB: [birth year]  Referred by: Marjie Skiff, NP Reason for referral : High Risk Managed Medicaid (Unsuccessful RNCM follow up outreach)   A second unsuccessful telephone outreach was attempted today. The patient was referred to the case management team for assistance with care management and care coordination.   Follow Up Plan: The care management team will reach out to the patient again over the next 14 days.   [name] RN, BSN [place]  [facility] [employer]

## 2021-04-18 ENCOUNTER — Other Ambulatory Visit: Payer: Self-pay | Admitting: *Deleted

## 2021-04-18 NOTE — Patient Outreach (Signed)
Care Coordination  04/18/2021  Janet Coleman 04/11/1986 [id number]   Medicaid Managed Care   Unsuccessful Outreach Note  04/18/2021 Name: Janet Coleman MRN: [id number] DOB: 18-May-1986  Referred by: Marjie Skiff, NP Reason for referral : High Risk Managed Medicaid (Unsuccessful RNCM telephone outreach, 3rd attempt)   Third unsuccessful telephone outreach was attempted today. The patient was referred to the case management team for assistance with care management and care coordination. The patient's primary care provider has been notified of our unsuccessful attempts to make or maintain contact with the patient. The care management team is pleased to engage with this patient at any time in the future should he/she be interested in assistance from the care management team.   Follow Up Plan: We have been unable to make contact with the patient for follow up. The care management team is available to follow up with the patient after provider conversation with the patient regarding recommendation for care management engagement and subsequent re-referral to the care management team.   [name] RN, BSN [place]  [facility] RN Care Coordinator

## 2021-04-25 ENCOUNTER — Encounter: Payer: Medicaid Other | Admitting: Obstetrics and Gynecology

## 2021-04-29 ENCOUNTER — Ambulatory Visit (INDEPENDENT_AMBULATORY_CARE_PROVIDER_SITE_OTHER): Payer: Medicaid Other | Admitting: Obstetrics and Gynecology

## 2021-04-29 ENCOUNTER — Encounter: Payer: Self-pay | Admitting: Obstetrics and Gynecology

## 2021-04-29 ENCOUNTER — Other Ambulatory Visit: Payer: Self-pay

## 2021-04-29 VITALS — BP 131/85 | HR 69 | Ht 66.0 in | Wt 187.1 lb

## 2021-04-29 DIAGNOSIS — Z3481 Encounter for supervision of other normal pregnancy, first trimester: Secondary | ICD-10-CM | POA: Diagnosis not present

## 2021-04-29 DIAGNOSIS — O09521 Supervision of elderly multigravida, first trimester: Secondary | ICD-10-CM

## 2021-04-29 DIAGNOSIS — N926 Irregular menstruation, unspecified: Secondary | ICD-10-CM

## 2021-04-29 DIAGNOSIS — Z641 Problems related to multiparity: Secondary | ICD-10-CM

## 2021-04-29 LAB — POCT URINE PREGNANCY: Preg Test, Ur: POSITIVE — AB

## 2021-04-29 MED ORDER — CITRANATAL BLOOM 90-1 MG PO TABS: 1.0000 | ORAL_TABLET | Freq: Every day | ORAL | 11 refills | Status: DC

## 2021-04-29 NOTE — Progress Notes (Signed)
HPI:      [name] is a 35 y.o. Janet Coleman who LMP was Patient's last menstrual period was 03/21/2021 (exact date).  Subjective:   She presents today because she missed menstrual period while taking OCPs.  She is insistent that she has not missed any pills.  Obviously not a planned pregnancy. Patient recently delivered a viable infant. She had difficulty breast-feeding with her last baby but is interested in breast-feeding with this baby if possible. Considering Mirena IUD for birth control postpartum.    Hx: The following portions of the patient's history were reviewed and updated as appropriate:             She  has a past medical history of GERD (gastroesophageal reflux disease), Hypertension, Hypothyroidism, Localized morphea, Migraines, Reactive hypoglycemia, Reactive hypoglycemia, Tobacco use, and Vaginal Pap smear, abnormal. She does not have any pertinent problems on file. She  has a past surgical history that includes Dilation and curettage of uterus; LEEP; and Blepharoptosis repair. Her family history includes Aneurysm in her father; Autoimmune disease in her mother; Cancer in her maternal aunt and maternal grandmother; Diabetes in her maternal grandmother and paternal grandmother; Heart attack in her father; Heart disease in her father and paternal grandmother; Hypertension in her father and mother; Stroke in her father and maternal grandfather. She  reports that she has been smoking cigarettes. She has a 4.25 pack-year smoking history. She has never used smokeless tobacco. She reports that she does not currently use alcohol. She reports that she does not use drugs. She has a current medication list which includes the following prescription(s): sertraline and citranatal bloom. She is allergic to toradol [ketorolac tromethamine] and ketorolac.       Review of Systems:  Review of Systems  Constitutional: Denied constitutional symptoms, night sweats, recent illness, fatigue,  fever, insomnia and weight loss.  Eyes: Denied eye symptoms, eye pain, photophobia, vision change and visual disturbance.  Ears/Nose/Throat/Neck: Denied ear, nose, throat or neck symptoms, hearing loss, nasal discharge, sinus congestion and sore throat.  Cardiovascular: Denied cardiovascular symptoms, arrhythmia, chest pain/pressure, edema, exercise intolerance, orthopnea and palpitations.  Respiratory: Denied pulmonary symptoms, asthma, pleuritic pain, productive sputum, cough, dyspnea and wheezing.  Gastrointestinal: Denied, gastro-esophageal reflux, melena, nausea and vomiting.  Genitourinary: Denied genitourinary symptoms including symptomatic vaginal discharge, pelvic relaxation issues, and urinary complaints.  Musculoskeletal: Denied musculoskeletal symptoms, stiffness, swelling, muscle weakness and myalgia.  Dermatologic: Denied dermatology symptoms, rash and scar.  Neurologic: Denied neurology symptoms, dizziness, headache, neck pain and syncope.  Psychiatric: Denied psychiatric symptoms, anxiety and depression.  Endocrine: Denied endocrine symptoms including hot flashes and night sweats.   Meds:   Current Outpatient Medications on File Prior to Visit  Medication Sig Dispense Refill   sertraline (ZOLOFT) 50 MG tablet Take 50 mg by mouth daily.     No current facility-administered medications on file prior to visit.      Objective:     Vitals:   04/29/21 1129  BP: 131/85  Pulse: 69   Filed Weights   04/29/21 1129  Weight: 187 lb 1.6 oz (84.9 kg)              Urinary pregnancy test positive          Assessment:    Janet Coleman Patient Active Problem List   Diagnosis Date Noted   Post-dates pregnancy 11/08/2020   Supervision of elderly multigravida (>=44 years old at time of delivery), second trimester 05/22/2020   Premature rupture of membranes  01/10/2019   Back pain in pregnancy 12/20/2018   Rh negative state in antepartum period 06/28/2018   Elevated cortisol  level 02/21/2015   Hypoglycemia 02/08/2015   GERD (gastroesophageal reflux disease)    Hypertension    Hypothyroidism    Tobacco use    Migraines      1. Missed menses   2. Encounter for supervision of other normal pregnancy in first trimester   3. Multigravida of advanced maternal age in first trimester   4. Grand multipara        Plan:            Prenatal Plan 1.  The patient was given prenatal literature. 2.  She was continued on prenatal vitamins. 3.  A prenatal lab panel to be drawn at nurse visit. 4.  An ultrasound was ordered to better determine an EDC. 5.  A nurse visit was scheduled. 6.  Genetic testing and testing for other inheritable conditions discussed in detail. She will decide in the future whether to have these labs performed. 7.  A general overview of pregnancy testing, visit schedule, ultrasound schedule, and prenatal care was discussed. 8.  COVID and its risks associated with pregnancy, prevention by limiting exposure and use of masks, as well as the risks and benefits of vaccination during pregnancy were discussed in detail.  [facility] policy regarding office and hospital visitation and testing was explained. 9.  Benefits of breast-feeding discussed in detail including both maternal and infant benefits. Ready Set Baby website discussed.  Orders Orders Placed This Encounter  Procedures   US OB Comp Less 14 Wks   POCT urine pregnancy     Meds ordered this encounter  Medications   Prenatal-DSS-FeCb-FeGl-FA (CITRANATAL BLOOM) 90-1 MG TABS    Sig: Take 1 tablet by mouth daily.    Dispense:  30 tablet    Refill:  11      F/U  Return in about 2 months (around 06/29/2021).  Janet Coleman, M.D. 04/29/2021 11:51 AM

## 2021-05-02 ENCOUNTER — Encounter: Payer: Medicaid Other | Admitting: Obstetrics and Gynecology

## 2021-05-13 ENCOUNTER — Other Ambulatory Visit: Payer: Self-pay

## 2021-05-13 ENCOUNTER — Ambulatory Visit (INDEPENDENT_AMBULATORY_CARE_PROVIDER_SITE_OTHER): Payer: Medicaid Other

## 2021-05-13 DIAGNOSIS — N926 Irregular menstruation, unspecified: Secondary | ICD-10-CM | POA: Diagnosis not present

## 2021-05-29 ENCOUNTER — Ambulatory Visit (INDEPENDENT_AMBULATORY_CARE_PROVIDER_SITE_OTHER): Payer: Medicaid Other | Admitting: Obstetrics and Gynecology

## 2021-05-29 ENCOUNTER — Other Ambulatory Visit: Payer: Self-pay

## 2021-05-29 ENCOUNTER — Encounter: Payer: Medicaid Other | Admitting: Obstetrics and Gynecology

## 2021-05-29 VITALS — BP 117/76 | HR 80 | Resp 16 | Ht 66.0 in | Wt 183.6 lb

## 2021-05-29 DIAGNOSIS — Z3A09 9 weeks gestation of pregnancy: Secondary | ICD-10-CM | POA: Diagnosis not present

## 2021-05-29 DIAGNOSIS — Z113 Encounter for screening for infections with a predominantly sexual mode of transmission: Secondary | ICD-10-CM | POA: Diagnosis not present

## 2021-05-29 DIAGNOSIS — Z3481 Encounter for supervision of other normal pregnancy, first trimester: Secondary | ICD-10-CM

## 2021-05-29 NOTE — Patient Instructions (Incomplete)
Commonly Asked Questions During Pregnancy  Cats: A parasite can be excreted in cat feces.  To avoid exposure you need to have another person empty the little box.  If you must empty the litter box you will need to wear gloves.  Wash your hands after handling your cat.  This parasite can also be found in raw or undercooked meat so this should also be avoided.  Colds, Sore Throats, Flu: Please check your medication sheet to see what you can take for symptoms.  If your symptoms are unrelieved by these medications please call the office.  Dental Work: Most any dental work your dentist recommends is permitted.  X-rays should only be taken during the first trimester if absolutely necessary.  Your abdomen should be shielded with a lead apron during all x-rays.  Please notify your provider prior to receiving any x-rays.  Novocaine is fine; gas is not recommended.  If your dentist requires a note from us prior to dental work please call the office and we will provide one for you.  Exercise: Exercise is an important part of staying healthy during your pregnancy.  You may continue most exercises you were accustomed to prior to pregnancy.  Later in your pregnancy you will most likely notice you have difficulty with activities requiring balance like riding a bicycle.  It is important that you listen to your body and avoid activities that put you at a higher risk of falling.  Adequate rest and staying well hydrated are a must!  If you have questions about the safety of specific activities ask your provider.    Exposure to Children with illness: Try to avoid obvious exposure; report any symptoms to us when noted,  If you have chicken pos, red measles or mumps, you should be immune to these diseases.   Please do not take any vaccines while pregnant unless you have checked with your OB provider.  Fetal Movement: After 28 weeks we recommend you do "kick counts" twice daily.  Lie or sit down in a calm quiet environment and  count your baby movements "kicks".  You should feel your baby at least 10 times per hour.  If you have not felt 10 kicks within the first hour get up, walk around and have something sweet to eat or drink then repeat for an additional hour.  If count remains less than 10 per hour notify your provider.  Fumigating: Follow your pest control agent's advice as to how long to stay out of your home.  Ventilate the area well before re-entering.  Hemorrhoids:   Most over-the-counter preparations can be used during pregnancy.  Check your medication to see what is safe to use.  It is important to use a stool softener or fiber in your diet and to drink lots of liquids.  If hemorrhoids seem to be getting worse please call the office.   Hot Tubs:  Hot tubs Jacuzzis and saunas are not recommended while pregnant.  These increase your internal body temperature and should be avoided.  Intercourse:  Sexual intercourse is safe during pregnancy as long as you are comfortable, unless otherwise advised by your provider.  Spotting may occur after intercourse; report any bright red bleeding that is heavier than spotting.  Labor:  If you know that you are in labor, please go to the hospital.  If you are unsure, please call the office and let us help you decide what to do.  Lifting, straining, etc:  If your job requires heavy   lifting or straining please check with your provider for any limitations.  Generally, you should not lift items heavier than that you can lift simply with your hands and arms (no back muscles)  Painting:  Paint fumes do not harm your pregnancy, but may make you ill and should be avoided if possible.  Latex or water based paints have less odor than oils.  Use adequate ventilation while painting.  Permanents & Hair Color:  Chemicals in hair dyes are not recommended as they cause increase hair dryness which can increase hair loss during pregnancy.  " Highlighting" and permanents are allowed.  Dye may be  absorbed differently and permanents may not hold as well during pregnancy.  Sunbathing:  Use a sunscreen, as skin burns easily during pregnancy.  Drink plenty of fluids; avoid over heating.  Tanning Beds:  Because their possible side effects are still unknown, tanning beds are not recommended.  Ultrasound Scans:  Routine ultrasounds are performed at approximately 20 weeks.  You will be able to see your baby's general anatomy an if you would like to know the gender this can usually be determined as well.  If it is questionable when you conceived you may also receive an ultrasound early in your pregnancy for dating purposes.  Otherwise ultrasound exams are not routinely performed unless there is a medical necessity.  Although you can request a scan we ask that you pay for it when conducted because insurance does not cover " patient request" scans.  Work: If your pregnancy proceeds without complications you may work until your due date, unless your physician or employer advises otherwise.  Round Ligament Pain/Pelvic Discomfort:  Sharp, shooting pains not associated with bleeding are fairly common, usually occurring in the second trimester of pregnancy.  They tend to be worse when standing up or when you remain standing for long periods of time.  These are the result of pressure of certain pelvic ligaments called "round ligaments".  Rest, Tylenol and heat seem to be the most effective relief.  As the womb and fetus grow, they rise out of the pelvis and the discomfort improves.  Please notify the office if your pain seems different than that described.  It may represent a more serious condition.  First Trimester of Pregnancy The first trimester of pregnancy starts on the first day of your last menstrual period until the end of week 12. This is also called months 1 through 3 of pregnancy. Body changes during your first trimester Your body goes through many changes during pregnancy. The changes usually  return to normal after your baby is born. Physical changes You may gain or lose weight. Your breasts may grow larger and hurt. The area around your nipples may get darker. Dark spots or blotches may develop on your face. You may have changes in your hair. Health changes You may feel like you might vomit (nauseous), and you may vomit. You may have heartburn. You may have headaches. You may have trouble pooping (constipation). Your gums may bleed. Other changes You may get tired easily. You may pee (urinate) more often. Your menstrual periods will stop. You may not feel hungry. You may want to eat certain kinds of food. You may have changes in your emotions from day to day. You may have more dreams. Follow these instructions at home: Medicines Take over-the-counter and prescription medicines only as told by your doctor. Some medicines are not safe during pregnancy. Take a prenatal vitamin that contains at least 600 micrograms (  mcg) of folic acid. °Eating and drinking °Eat healthy meals that include: °Fresh fruits and vegetables. °Whole grains. °Good sources of protein, such as meat, eggs, or tofu. °Low-fat dairy products. °Avoid raw meat and unpasteurized juice, milk, and cheese. °If you feel like you may vomit, or you vomit: °Eat 4 or 5 small meals a day instead of 3 large meals. °Try eating a few soda crackers. °Drink liquids between meals instead of during meals. °You may need to take these actions to prevent or treat trouble pooping: °Drink enough fluids to keep your pee (urine) pale yellow. °Eat foods that are high in fiber. These include beans, whole grains, and fresh fruits and vegetables. °Limit foods that are high in fat and sugar. These include fried or sweet foods. °Activity °Exercise only as told by your doctor. Most people can do their usual exercise routine during pregnancy. °Stop exercising if you have cramps or pain in your lower belly (abdomen) or low back. °Do not exercise if  it is too hot or too humid, or if you are in a place of great height (high altitude). °Avoid heavy lifting. °If you choose to, you may have sex unless your doctor tells you not to. °Relieving pain and discomfort °Wear a good support bra if your breasts are sore. °Rest with your legs raised (elevated) if you have leg cramps or low back pain. °If you have bulging veins (varicose veins) in your legs: °Wear support hose as told by your doctor. °Raise your feet for 15 minutes, 3-4 times a day. °Limit salt in your food. °Safety °Wear your seat belt at all times when you are in a car. °Talk with your doctor if someone is hurting you or yelling at you. °Talk with your doctor if you are feeling sad or have thoughts of hurting yourself. °Lifestyle °Do not use hot tubs, steam rooms, or saunas. °Do not douche. Do not use tampons or scented sanitary pads. °Do not use herbal medicines, illegal drugs, or medicines that are not approved by your doctor. Do not drink alcohol. °Do not smoke or use any products that contain nicotine or tobacco. If you need help quitting, ask your doctor. °Avoid cat litter boxes and soil that is used by cats. These carry germs that can cause harm to the baby and can cause a loss of your baby by miscarriage or stillbirth. °General instructions °Keep all follow-up visits. This is important. °Ask for help if you need counseling or if you need help with nutrition. Your doctor can give you advice or tell you where to go for help. °Visit your dentist. At home, brush your teeth with a soft toothbrush. Floss gently. °Write down your questions. Take them to your prenatal visits. °Where to find more information °American Pregnancy Association: americanpregnancy.org °American College of Obstetricians and Gynecologists: www.acog.org °Office on Women's Health: womenshealth.gov/pregnancy °Contact a doctor if: °You are dizzy. °You have a fever. °You have mild cramps or pressure in your lower belly. °You have a nagging  pain in your belly area. °You continue to feel like you may vomit, you vomit, or you have watery poop (diarrhea) for 24 hours or longer. °You have a bad-smelling fluid coming from your vagina. °You have pain when you pee. °You are exposed to a disease that spreads from person to person, such as chickenpox, measles, Zika virus, HIV, or hepatitis. °Get help right away if: °You have spotting or bleeding from your vagina. °You have very bad belly cramping or pain. °You have shortness   of breath or chest pain. You have any kind of injury, such as from a fall or a car crash. You have new or increased pain, swelling, or redness in an arm or leg. Summary The first trimester of pregnancy starts on the first day of your last menstrual period until the end of week 12 (months 1 through 3). Eat 4 or 5 small meals a day instead of 3 large meals. Do not smoke or use any products that contain nicotine or tobacco. If you need help quitting, ask your doctor. Keep all follow-up visits. This information is not intended to replace advice given to you by your health care provider. Make sure you discuss any questions you have with your health care provider. Document Revised: 12/20/2019 Document Reviewed: 10/26/2019 Elsevier Patient Education  [address] [place] Class  January 06, 2017  Wednesday 7:00p - 9:00p  [facility] [place], [place]  February 10, 2017  Wednesday 7:00p - 9:00p [facility] [place], [place]    March 17, 2017   Wednesday 7:00p - 9:000p [facility] [place], [place]  April 14, 2017  Wednesday  7:00p - 9:00p [facility] [place], [place]  May 12, 2017 Wednesday 7:00p - 9:00p [facility] [place], [place]  Interested in a waterbirth?  This informational class will help you discover whether waterbirth is the right fit for you.  Education about waterbirth itself, supplies you would need  and how to assemble your support team is what you can expect from this class.  Some obstetrical practices require this class in order to pursue a waterbirth.  (Not all obstetrical practices offer waterbirth check with your healthcare provider)  Register only the expectant mom, but you are encouraged to bring your partner to class!  Fees & Payment No fee  Register Online [email or web address] Search [place]

## 2021-05-29 NOTE — Progress Notes (Signed)
Janet Coleman presents for NOB nurse interview visit. Pregnancy confirmation done 04/29/2021.  [id number]    . Pregnancy education material explained and given. No cats in the home. NOB labs ordered.  HIV labs and Drug screen were explained optional and she did decline. Drug screen declined. PNV encouraged. Genetic screening options discussed. Genetic testing: Ordered  Pt may discuss with provider.  Financial policy not applicable. FMLA form reviewed and signed. Pt. To follow up with provider in 3 weeks with Dr. Logan Bores for NOB physical.  All questions answered.

## 2021-05-30 LAB — URINALYSIS, ROUTINE W REFLEX MICROSCOPIC
Bilirubin, UA: NEGATIVE
Glucose, UA: NEGATIVE
Ketones, UA: NEGATIVE
Leukocytes,UA: NEGATIVE
Nitrite, UA: NEGATIVE
Protein,UA: NEGATIVE
RBC, UA: NEGATIVE
Specific Gravity, UA: 1.018 (ref 1.005–1.030)
Urobilinogen, Ur: 0.2 mg/dL (ref 0.2–1.0)
pH, UA: 7.5 (ref 5.0–7.5)

## 2021-05-30 LAB — RPR: RPR Ser Ql: NONREACTIVE

## 2021-05-30 LAB — VIRAL HEPATITIS HBV, HCV
HCV Ab: <0.1 s/co ratio (ref 0.0–0.9)
Hep B Core Total Ab: NEGATIVE
Hep B Surface Ab, Qual: REACTIVE
Hepatitis B Surface Ag: NEGATIVE

## 2021-05-30 LAB — PARVOVIRUS B19 ANTIBODY, IGG AND IGM
Parvovirus B19 IgG: 0.3 index (ref 0.0–0.8)
Parvovirus B19 IgM: 0.4 index (ref 0.0–0.8)

## 2021-05-30 LAB — HCV INTERPRETATION

## 2021-05-30 LAB — ANTIBODY SCREEN: Antibody Screen: NEGATIVE

## 2021-05-30 LAB — ABO AND RH: Rh Factor: NEGATIVE

## 2021-05-30 LAB — RUBELLA SCREEN: Rubella Antibodies, IGG: 6.15 index (ref >0.99)

## 2021-05-30 LAB — NICOTINE SCREEN, URINE: Cotinine Ql Scrn, Ur: POSITIVE ng/mL — AB

## 2021-05-30 LAB — VARICELLA ZOSTER ANTIBODY, IGG: Varicella zoster IgG: 2102 index (ref >165)

## 2021-05-31 LAB — URINE CULTURE, OB REFLEX

## 2021-05-31 LAB — GC/CHLAMYDIA PROBE AMP
Chlamydia trachomatis, NAA: NEGATIVE
Neisseria Gonorrhoeae by PCR: NEGATIVE

## 2021-05-31 LAB — CULTURE, OB URINE

## 2021-06-22 ENCOUNTER — Other Ambulatory Visit: Payer: Self-pay

## 2021-06-22 ENCOUNTER — Emergency Department: Payer: Medicaid Other

## 2021-06-22 ENCOUNTER — Emergency Department
Admission: EM | Admit: 2021-06-22 | Discharge: 2021-06-22 | Disposition: A | Discharge status: Home / Self Care | Payer: Medicaid Other | Attending: Emergency Medicine | Admitting: Emergency Medicine

## 2021-06-22 DIAGNOSIS — E039 Hypothyroidism, unspecified: Secondary | ICD-10-CM | POA: Insufficient documentation

## 2021-06-22 DIAGNOSIS — I10 Essential (primary) hypertension: Secondary | ICD-10-CM | POA: Diagnosis not present

## 2021-06-22 DIAGNOSIS — R102 Pelvic and perineal pain: Secondary | ICD-10-CM | POA: Insufficient documentation

## 2021-06-22 DIAGNOSIS — N9489 Other specified conditions associated with female genital organs and menstrual cycle: Secondary | ICD-10-CM | POA: Insufficient documentation

## 2021-06-22 DIAGNOSIS — F17210 Nicotine dependence, cigarettes, uncomplicated: Secondary | ICD-10-CM | POA: Diagnosis not present

## 2021-06-22 DIAGNOSIS — O26891 Other specified pregnancy related conditions, first trimester: Secondary | ICD-10-CM | POA: Diagnosis not present

## 2021-06-22 DIAGNOSIS — O034 Incomplete spontaneous abortion without complication: Secondary | ICD-10-CM | POA: Diagnosis not present

## 2021-06-22 DIAGNOSIS — Z3A11 11 weeks gestation of pregnancy: Secondary | ICD-10-CM | POA: Diagnosis not present

## 2021-06-22 LAB — CBC WITH DIFFERENTIAL/PLATELET
Abs Immature Granulocytes: 0.03 10*3/uL (ref 0.00–0.07)
Basophils Absolute: 0 10*3/uL (ref 0.0–0.1)
Basophils Relative: 0 %
Eosinophils Absolute: 0.2 10*3/uL (ref 0.0–0.5)
Eosinophils Relative: 2 %
HCT: 39.3 % (ref 36.0–46.0)
Hemoglobin: 13.9 g/dL (ref 12.0–15.0)
Immature Granulocytes: 0 %
Lymphocytes Relative: 33 %
Lymphs Abs: 2.6 10*3/uL (ref 0.7–4.0)
MCH: 32.6 pg (ref 26.0–34.0)
MCHC: 35.4 g/dL (ref 30.0–36.0)
MCV: 92 fL (ref 80.0–100.0)
Monocytes Absolute: 0.3 10*3/uL (ref 0.1–1.0)
Monocytes Relative: 4 %
Neutro Abs: 4.6 10*3/uL (ref 1.7–7.7)
Neutrophils Relative %: 61 %
Platelets: 177 10*3/uL (ref 150–400)
RBC: 4.27 MIL/uL (ref 3.87–5.11)
RDW: 13.1 % (ref 11.5–15.5)
WBC: 7.7 10*3/uL (ref 4.0–10.5)
nRBC: 0 % (ref 0.0–0.2)

## 2021-06-22 LAB — URINALYSIS, COMPLETE (UACMP) WITH MICROSCOPIC
Bilirubin Urine: NEGATIVE
Glucose, UA: NEGATIVE mg/dL
Hgb urine dipstick: NEGATIVE
Ketones, ur: NEGATIVE mg/dL
Leukocytes,Ua: NEGATIVE
Nitrite: NEGATIVE
Protein, ur: NEGATIVE mg/dL
Specific Gravity, Urine: 1.025 (ref 1.005–1.030)
pH: 6.5 (ref 5.0–8.0)

## 2021-06-22 LAB — COMPREHENSIVE METABOLIC PANEL
ALT: 25 U/L (ref 0–44)
AST: 24 U/L (ref 15–41)
Albumin: 3.7 g/dL (ref 3.5–5.0)
Alkaline Phosphatase: 65 U/L (ref 38–126)
Anion gap: 6 (ref 5–15)
BUN: 8 mg/dL (ref 6–20)
CO2: 22 mmol/L (ref 22–32)
Calcium: 8.7 mg/dL — ABNORMAL LOW (ref 8.9–10.3)
Chloride: 111 mmol/L (ref 98–111)
Creatinine, Ser: 0.45 mg/dL (ref 0.44–1.00)
GFR, Estimated: >60 mL/min (ref >60)
Glucose, Bld: 93 mg/dL (ref 70–99)
Potassium: 3.6 mmol/L (ref 3.5–5.1)
Sodium: 139 mmol/L (ref 135–145)
Total Bilirubin: 0.6 mg/dL (ref 0.3–1.2)
Total Protein: 7 g/dL (ref 6.5–8.1)

## 2021-06-22 LAB — HCG, QUANTITATIVE, PREGNANCY: hCG, Beta Chain, Quant, S: 4481 m[IU]/mL — ABNORMAL HIGH (ref <5)

## 2021-06-22 MED ORDER — ONDANSETRON 8 MG PO TBDP: 8.0000 mg | ORAL_TABLET | Freq: Three times a day (TID) | ORAL | 0 refills | Status: DC | PRN

## 2021-06-22 MED ORDER — HYDROCODONE-ACETAMINOPHEN 5-325 MG PO TABS: 1.0000 | ORAL_TABLET | ORAL | 0 refills | Status: DC | PRN

## 2021-06-22 NOTE — ED Notes (Signed)
Pt pulled call light in the bathroom at this time. Pt states that she passed a couple of clots and now she feels like she about to pass out. This RN rechecked VSS and VSS stable at this time. Dr. Vicente Males MD made aware.

## 2021-06-22 NOTE — ED Notes (Signed)
Patient states now has vaginal bleeding. Alert and oriented x4, respirations even and unlabored.

## 2021-06-22 NOTE — ED Provider Notes (Signed)
[facility] Emergency Department Provider Note   ____________________________________________   None    (approximate)  I have reviewed the triage vital signs and the nursing notes.   HISTORY  Chief Complaint pelvic cramping    HPI Janet Coleman is a 35 y.o. female who presents for pelvic pain  LOCATION: Pelvis DURATION: 1 day prior to arrival TIMING: Intermittent worsening since onset SEVERITY: 8/10 QUALITY: Cramping pain CONTEXT: Patient states that she is approximately [redacted] weeks pregnant began having cramping lower abdominal pain with associated nausea and lightheadedness MODIFYING FACTORS: Denies any exacerbating or relieving factors ASSOCIATED SYMPTOMS: Lightheadedness, recently started vaginal bleeding   Per medical record review, patient has history of 14-week pregnancy          Past Medical History:  Diagnosis Date   GERD (gastroesophageal reflux disease)    Hypertension    Hypothyroidism    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   Migraines    Reactive hypoglycemia    Reactive hypoglycemia    Tobacco use    Vaginal Pap smear, abnormal     Patient Active Problem List   Diagnosis Date Noted   Post-dates pregnancy 11/08/2020   Supervision of elderly multigravida (>=74 years old at time of delivery), second trimester 05/22/2020   Premature rupture of membranes 01/10/2019   Back pain in pregnancy 12/20/2018   Rh negative state in antepartum period 06/28/2018   Elevated cortisol level 02/21/2015   Hypoglycemia 02/08/2015   GERD (gastroesophageal reflux disease)    Hypertension    Hypothyroidism    Tobacco use    Migraines     Past Surgical History:  Procedure Laterality Date   BELPHAROPTOSIS REPAIR     eye lid lift   DILATION AND CURETTAGE OF UTERUS     x 2   LEEP     x 2. last paps 2011, 2012, 2013 were normal    Prior to Admission medications   Medication Sig  Start Date End Date Taking? Authorizing Provider  HYDROcodone-acetaminophen (NORCO/VICODIN) 5-325 MG tablet Take 1 tablet by mouth every 4 (four) hours as needed for moderate pain. 06/22/21 06/22/22 Yes Ottis Vacha, Clent Jacks, MD  ondansetron (ZOFRAN-ODT) 8 MG disintegrating tablet Take 1 tablet (8 mg total) by mouth every 8 (eight) hours as needed for nausea or vomiting. 06/22/21  Yes Merwyn Katos, MD  pantoprazole (PROTONIX) 40 MG tablet pantoprazole 40 mg tablet,delayed release  TAKE 1 TABLET BY MOUTH EVERY DAY    [provider]  Prenatal-DSS-FeCb-FeGl-FA (CITRANATAL BLOOM) 90-1 MG TABS Take 1 tablet by mouth daily. 04/29/21   Linzie Collin, MD  sertraline (ZOLOFT) 50 MG tablet Take 50 mg by mouth daily.    [provider]    Allergies Toradol [ketorolac tromethamine] and Ketorolac  Family History  Problem Relation Age of Onset   Hypertension Mother    Autoimmune disease Mother    Stroke Father    Hypertension Father    Heart attack Father    Aneurysm Father        brain   Heart disease Father    Cancer Maternal Aunt        breast   Cancer Maternal Grandmother        breast   Diabetes Maternal Grandmother    Stroke Maternal Grandfather    Diabetes Paternal Grandmother    Heart disease Paternal Grandmother     Social History Social History   Tobacco Use  Smoking status: Some Days    Packs/day: 0.25    Years: 17.00    Pack years: 4.25    Types: Cigarettes   Smokeless tobacco: Never  Vaping Use   Vaping Use: Some days  Substance Use Topics   Alcohol use: Not Currently    Comment: occass   Drug use: No    Review of Systems Constitutional: No fever/chills Eyes: No visual changes. ENT: No sore throat. Cardiovascular: Denies chest pain. Respiratory: Denies shortness of breath. Gastrointestinal: Endorses lower abdominal pain.  No nausea, no vomiting.  No diarrhea. Genitourinary: Negative for dysuria. Musculoskeletal: Negative for acute  arthralgias Skin: Negative for rash. Neurological: Negative for headaches, weakness/numbness/paresthesias in any extremity Psychiatric: Negative for suicidal ideation/homicidal ideation   ____________________________________________   PHYSICAL EXAM:  VITAL SIGNS: ED Triage Vitals  Enc Vitals Group     BP 06/22/21 0416 (!) 131/92     Pulse Rate 06/22/21 0416 87     Resp 06/22/21 0415 18     Temp 06/22/21 0416 98.1 F (36.7 C)     Temp Source 06/22/21 0416 Oral     SpO2 06/22/21 0416 97 %     Weight 06/22/21 0415 183 lb (83 kg)     Height 06/22/21 0415 5\' 6"  (1.676 m)     Head Circumference --      Peak Flow --      Pain Score 06/22/21 0415 6     Pain Loc --      Pain Edu? --      Excl. in GC? --    Constitutional: Alert and oriented. Well appearing and in no acute distress. Eyes: Conjunctivae are normal. PERRL. Head: Atraumatic. Nose: No congestion/rhinnorhea. Mouth/Throat: Mucous membranes are moist. Neck: No stridor Cardiovascular: Grossly normal heart sounds.  Good peripheral circulation. Respiratory: Normal respiratory effort.  No retractions. Gastrointestinal: Soft and tender to palpation in the suprapubic region. No distention. Musculoskeletal: No obvious deformities Neurologic:  Normal speech and language. No gross focal neurologic deficits are appreciated. Skin:  Skin is warm and dry. No rash noted. Psychiatric: Mood and affect are normal. Speech and behavior are normal.  ____________________________________________   LABS (all labs ordered are listed, but only abnormal results are displayed)  Labs Reviewed  COMPREHENSIVE METABOLIC PANEL - Abnormal; Notable for the following components:      Result Value   Calcium 8.7 (*)    All other components within normal limits  HCG, QUANTITATIVE, PREGNANCY - Abnormal; Notable for the following components:   hCG, Beta Chain, Quant, S 4,481 (*)    All other components within normal limits  URINALYSIS, COMPLETE  (UACMP) WITH MICROSCOPIC - Abnormal; Notable for the following components:   APPearance CLEAR (*)    Bacteria, UA RARE (*)    All other components within normal limits  CBC WITH DIFFERENTIAL/PLATELET   RADIOLOGY  ED MD interpretation: OB ultrasound shows 14-week early pregnancy with crown-rump length at approximately 14 weeks however no detectable cardiac activity which meets definitive criteria for failed pregnancy  Official radiology report(s): 06/24/21 OB Comp Less 14 Wks  Result Date: 06/22/2021 CLINICAL DATA:  Initial evaluation for acute pelvic cramping, early pregnancy. EXAM: OBSTETRIC <14 WK ULTRASOUND TECHNIQUE: Transabdominal ultrasound was performed for evaluation of the gestation as well as the maternal uterus and adnexal regions. COMPARISON:  Prior ultrasound from 05/13/2021 FINDINGS: Intrauterine gestational sac: Single Yolk sac:  Present Embryo:  Present Cardiac Activity: Negative Heart Rate: N/A CRL:   45.4 mm   11 w  3 d                  [place] EDC: N/A Subchorionic hemorrhage:  None visualized. Maternal uterus/adnexae: Ovaries within normal limits. No adnexal mass or free fluid. Cervix measures 2 cm in length. IMPRESSION: 1. Early pregnancy, with crown-rump length measuring 45.4 mm, but no detectable cardiac activity. Findings meet definitive criteria for failed pregnancy. This follows SRU consensus guidelines: Diagnostic Criteria for Nonviable Pregnancy Early in the First Trimester. [facility]acy Mis J Med [phone]. 2. No other acute maternal uterine or adnexal abnormality. Electronically Signed   By: Rise Mu M.D.   On: 06/22/2021 05:44    ____________________________________________   PROCEDURES  Procedure(s) performed (including Critical Care):  Procedures   ____________________________________________   INITIAL IMPRESSION / ASSESSMENT AND PLAN / ED COURSE  As part of my medical decision making, I reviewed the following data within the electronic medical record, if  available:  Nursing notes reviewed and incorporated, Labs reviewed, EKG interpreted, Old chart reviewed, Radiograph reviewed and Notes from prior ED visits reviewed and incorporated      Patient is a 35 year old female who presents with lower abdominal cramping and new onset vaginal bleeding  Workup: CBC, BMP, UA, bHCG, Type&Screen, 1st Trimester Ultrasound  Based on History, Exam, and ED Workup patient's presentation not consistent with ectopic pregnancy, molar pregnancy, life-threatening coagulopathy, trauma, serious bacterial infection, central process or other emergency.  OB ultrasound shows nonviable intrauterine pregnancy at approximately 14 weeks.  Patient was counseled on these results as well as what to expect moving forward including follow-up with OB/GYN within the next 24 hours for definitive management.  Rx: Norco/Zofran  Disposition: Will discharge home with return precautions and instruction for prompt OBGYN follow up.      ____________________________________________   FINAL CLINICAL IMPRESSION(S) / ED DIAGNOSES  Final diagnoses:  Incomplete inevitable abortion     ED Discharge Orders          Ordered    HYDROcodone-acetaminophen (NORCO/VICODIN) 5-325 MG tablet  Every 4 hours PRN        06/22/21 0804    ondansetron (ZOFRAN-ODT) 8 MG disintegrating tablet  Every 8 hours PRN        06/22/21 0804             Note:  This document was prepared using Dragon voice recognition software and may include unintentional dictation errors.    Merwyn Katos, MD 06/22/21 [phone]

## 2021-06-22 NOTE — ED Triage Notes (Signed)
Pt states is [redacted] weeks pregnant and having abd cramping. Pt denies vaginal bleeding, dysuria.

## 2021-06-23 ENCOUNTER — Telehealth: Payer: Self-pay

## 2021-06-23 ENCOUNTER — Ambulatory Visit (INDEPENDENT_AMBULATORY_CARE_PROVIDER_SITE_OTHER): Payer: Medicaid Other | Admitting: Obstetrics and Gynecology

## 2021-06-23 DIAGNOSIS — Z6741 Type O blood, Rh negative: Secondary | ICD-10-CM | POA: Diagnosis not present

## 2021-06-23 MED ORDER — RHO D IMMUNE GLOBULIN 1500 UNIT/2ML IJ SOSY: 300.0000 ug | PREFILLED_SYRINGE | Freq: Once | INTRAMUSCULAR | Status: DC

## 2021-06-23 MED ORDER — RHO D IMMUNE GLOBULIN 1500 UNIT/2ML IJ SOSY
300.0000 ug | PREFILLED_SYRINGE | Freq: Once | INTRAMUSCULAR | Status: AC
  Administered 2021-06-23: 17:00:00 300 ug via INTRAMUSCULAR

## 2021-06-23 NOTE — Telephone Encounter (Signed)
Patient called about not getting a Rhogam injection after her miscarriage yesterday. Advised by Pattricia Boss that she needs a Rhogam if she has a negative blood type. She is O-. Instructed patient to come in for injection today.

## 2021-06-23 NOTE — Progress Notes (Signed)
Patient came in for Rhogam injection after having a miscarriage. She has O negative blood type. She was evaluated at ED yesterday. She feels a lot better than she did yesterday. Still has some pain, but not as bad.

## 2021-06-23 NOTE — Telephone Encounter (Signed)
Transition Care Management Unsuccessful Follow-up Telephone Call  Date of discharge and from where:  06/22/2021 from [facility]  Attempts:  1st Attempt  Reason for unsuccessful TCM follow-up call:  Left voice message

## 2021-06-24 ENCOUNTER — Ambulatory Visit (INDEPENDENT_AMBULATORY_CARE_PROVIDER_SITE_OTHER): Payer: Medicaid Other | Admitting: Obstetrics and Gynecology

## 2021-06-24 ENCOUNTER — Other Ambulatory Visit: Payer: Self-pay

## 2021-06-24 ENCOUNTER — Encounter: Payer: Self-pay | Admitting: Obstetrics and Gynecology

## 2021-06-24 DIAGNOSIS — Z3009 Encounter for other general counseling and advice on contraception: Secondary | ICD-10-CM

## 2021-06-24 DIAGNOSIS — O039 Complete or unspecified spontaneous abortion without complication: Secondary | ICD-10-CM | POA: Diagnosis not present

## 2021-06-24 NOTE — Progress Notes (Signed)
HPI:      [name] is a 35 y.o. [id number] who LMP was Patient's last menstrual period was 03/21/2021 (exact date).  Subjective:   She presents today for follow-up after an ED visit.  Patient went to the emergency department with pelvic cramping.  An ultrasound revealed an intrauterine pregnancy without fetal heart tones.  After being discharged from the emergency department while still in the [place] began having severe cramping and passing tissue.  She was sent home.  At home she passed more tissue and had significantly more cramping and some bleeding. She reports that today she is doing much better her bleeding has decreased.  She came to our office and received a RhoGAM shot. She does not desire future pregnancy and is considering IUD for birth control.    Hx: The following portions of the patient's history were reviewed and updated as appropriate:             She  has a past medical history of GERD (gastroesophageal reflux disease), Hypertension, Hypothyroidism, Localized morphea, Migraines, Reactive hypoglycemia, Reactive hypoglycemia, Tobacco use, and Vaginal Pap smear, abnormal. She does not have any pertinent problems on file. She  has a past surgical history that includes Dilation and curettage of uterus; LEEP; and Blepharoptosis repair. Her family history includes Aneurysm in her father; Autoimmune disease in her mother; Cancer in her maternal aunt and maternal grandmother; Diabetes in her maternal grandmother and paternal grandmother; Heart attack in her father; Heart disease in her father and paternal grandmother; Hypertension in her father and mother; Stroke in her father and maternal grandfather. She  reports that she has been smoking cigarettes. She has a 4.25 pack-year smoking history. She has never used smokeless tobacco. She reports that she does not currently use alcohol. She reports that she does not use drugs. She has a current medication list which includes  the following prescription(s): ondansetron, pantoprazole, sertraline, hydrocodone-acetaminophen, and citranatal bloom, and the following Facility-Administered Medications: rho (d) immune globulin. She is allergic to toradol [ketorolac tromethamine] and ketorolac.       Review of Systems:  Review of Systems  Constitutional: Denied constitutional symptoms, night sweats, recent illness, fatigue, fever, insomnia and weight loss.  Eyes: Denied eye symptoms, eye pain, photophobia, vision change and visual disturbance.  Ears/Nose/Throat/Neck: Denied ear, nose, throat or neck symptoms, hearing loss, nasal discharge, sinus congestion and sore throat.  Cardiovascular: Denied cardiovascular symptoms, arrhythmia, chest pain/pressure, edema, exercise intolerance, orthopnea and palpitations.  Respiratory: Denied pulmonary symptoms, asthma, pleuritic pain, productive sputum, cough, dyspnea and wheezing.  Gastrointestinal: Denied, gastro-esophageal reflux, melena, nausea and vomiting.  Genitourinary: Denied genitourinary symptoms including symptomatic vaginal discharge, pelvic relaxation issues, and urinary complaints.  Musculoskeletal: Denied musculoskeletal symptoms, stiffness, swelling, muscle weakness and myalgia.  Dermatologic: Denied dermatology symptoms, rash and scar.  Neurologic: Denied neurology symptoms, dizziness, headache, neck pain and syncope.  Psychiatric: Denied psychiatric symptoms, anxiety and depression.  Endocrine: Denied endocrine symptoms including hot flashes and night sweats.   Meds:   Current Outpatient Medications on File Prior to Visit  Medication Sig Dispense Refill   ondansetron (ZOFRAN-ODT) 8 MG disintegrating tablet Take 1 tablet (8 mg total) by mouth every 8 (eight) hours as needed for nausea or vomiting. 20 tablet 0   pantoprazole (PROTONIX) 40 MG tablet pantoprazole 40 mg tablet,delayed release  TAKE 1 TABLET BY MOUTH EVERY DAY     sertraline (ZOLOFT) 100 MG tablet Take  100 mg by mouth daily.  HYDROcodone-acetaminophen (NORCO/VICODIN) 5-325 MG tablet Take 1 tablet by mouth every 4 (four) hours as needed for moderate pain. (Patient not taking: Reported on 06/24/2021) 20 tablet 0   Prenatal-DSS-FeCb-FeGl-FA (CITRANATAL BLOOM) 90-1 MG TABS Take 1 tablet by mouth daily. (Patient not taking: Reported on 06/24/2021) 30 tablet 11   Current Facility-Administered Medications on File Prior to Visit  Medication Dose Route Frequency Provider Last Rate Last Admin   rho (d) immune globulin (RHIG/RHOPHYLAC) injection 300 mcg  300 mcg Intramuscular Once Linzie Collin, MD          Objective:     There were no vitals filed for this visit. There were no vitals filed for this visit.                      Assessment:    [id number] Patient Active Problem List   Diagnosis Date Noted   Post-dates pregnancy 11/08/2020   Supervision of elderly multigravida (>=10 years old at time of delivery), second trimester 05/22/2020   Premature rupture of membranes 01/10/2019   Back pain in pregnancy 12/20/2018   Rh negative state in antepartum period 06/28/2018   Elevated cortisol level 02/21/2015   Hypoglycemia 02/08/2015   GERD (gastroesophageal reflux disease)    Hypertension    Hypothyroidism    Tobacco use    Migraines      1. Complete abortion   2. Birth control counseling     Likely complete miscarriage.   Plan:            1.  Discussed miscarriage in detail.  Questions answered.  2.  Future birth control methods discussed.  IUD specifically discussed at patient request.  3.  Quantitative beta-hCG in 2 weeks.  Patient would like to consider an IUD around that time if possible. Orders Orders Placed This Encounter  Procedures   Beta hCG quant (ref lab)    No orders of the defined types were placed in this encounter.     F/U  Return in about 6 weeks (around 08/05/2021) for We will contact her with any abnormal test results. I spent 22 minutes involved  in the care of this patient preparing to see the patient by obtaining and reviewing her medical history (including labs, imaging tests and prior procedures), documenting clinical information in the electronic health record (EHR), counseling and coordinating care plans, writing and sending prescriptions, ordering tests or procedures and in direct communicating with the patient and medical staff discussing pertinent items from her history and physical exam.  Elonda Husky, M.D. 06/24/2021 11:36 AM

## 2021-06-24 NOTE — Telephone Encounter (Signed)
Transition Care Management Follow-up Telephone Call Date of discharge and from where: 06/22/2021 from [facility] How have you been since you were released from the hospital? Pt stated that she is doing well and did not have any questions or concerns at this time.  Any questions or concerns? No  Items Reviewed: Did the pt receive and understand the discharge instructions provided? Yes  Medications obtained and verified? Yes  Other?  No Any new allergies since your discharge? No  Dietary orders reviewed? No Do you have support at home? Yes   Functional Questionnaire: (I = Independent and D = Dependent) ADLs: I  Bathing/Dressing- I  Meal Prep- I  Eating- I  Maintaining continence- I  Transferring/Ambulation- I  Managing Meds- I   Follow up appointments reviewed:  PCP Hospital f/u appt confirmed? No   Specialist Hospital f/u appt confirmed? No   Are transportation arrangements needed? No  If their condition worsens, is the pt aware to call PCP or go to the Emergency Dept.? Yes Was the patient provided with contact information for the PCP's office or ED? Yes Was to pt encouraged to call back with questions or concerns? Yes

## 2021-06-30 ENCOUNTER — Encounter: Payer: Self-pay | Admitting: *Deleted

## 2021-06-30 ENCOUNTER — Telehealth: Payer: Self-pay | Admitting: Obstetrics and Gynecology

## 2021-06-30 ENCOUNTER — Other Ambulatory Visit: Payer: Self-pay

## 2021-06-30 ENCOUNTER — Emergency Department: Payer: Medicaid Other | Admitting: Anesthesiology

## 2021-06-30 ENCOUNTER — Encounter
Admission: EM | Disposition: A | Discharge status: Home / Self Care | Payer: Self-pay | Source: Home / Self Care | Attending: Emergency Medicine

## 2021-06-30 ENCOUNTER — Ambulatory Visit
Admission: EM | Admit: 2021-06-30 | Discharge: 2021-06-30 | Disposition: A | Discharge status: Home / Self Care | Payer: Medicaid Other | Attending: Emergency Medicine | Admitting: Emergency Medicine

## 2021-06-30 ENCOUNTER — Emergency Department: Payer: Medicaid Other

## 2021-06-30 DIAGNOSIS — O021 Missed abortion: Secondary | ICD-10-CM | POA: Diagnosis not present

## 2021-06-30 DIAGNOSIS — Z20822 Contact with and (suspected) exposure to covid-19: Secondary | ICD-10-CM | POA: Diagnosis not present

## 2021-06-30 DIAGNOSIS — F17210 Nicotine dependence, cigarettes, uncomplicated: Secondary | ICD-10-CM | POA: Diagnosis not present

## 2021-06-30 DIAGNOSIS — Z3A14 14 weeks gestation of pregnancy: Secondary | ICD-10-CM | POA: Diagnosis not present

## 2021-06-30 DIAGNOSIS — O034 Incomplete spontaneous abortion without complication: Secondary | ICD-10-CM

## 2021-06-30 DIAGNOSIS — O99332 Smoking (tobacco) complicating pregnancy, second trimester: Secondary | ICD-10-CM | POA: Insufficient documentation

## 2021-06-30 DIAGNOSIS — R58 Hemorrhage, not elsewhere classified: Secondary | ICD-10-CM | POA: Diagnosis not present

## 2021-06-30 DIAGNOSIS — O209 Hemorrhage in early pregnancy, unspecified: Secondary | ICD-10-CM | POA: Diagnosis not present

## 2021-06-30 DIAGNOSIS — O4690 Antepartum hemorrhage, unspecified, unspecified trimester: Secondary | ICD-10-CM

## 2021-06-30 DIAGNOSIS — I10 Essential (primary) hypertension: Secondary | ICD-10-CM | POA: Diagnosis not present

## 2021-06-30 DIAGNOSIS — O039 Complete or unspecified spontaneous abortion without complication: Secondary | ICD-10-CM

## 2021-06-30 DIAGNOSIS — D62 Acute posthemorrhagic anemia: Secondary | ICD-10-CM

## 2021-06-30 DIAGNOSIS — N939 Abnormal uterine and vaginal bleeding, unspecified: Secondary | ICD-10-CM

## 2021-06-30 HISTORY — PX: DILATION AND EVACUATION: SHX1459

## 2021-06-30 LAB — COMPREHENSIVE METABOLIC PANEL
ALT: 9 U/L (ref 0–44)
AST: 14 U/L — ABNORMAL LOW (ref 15–41)
Albumin: 2.9 g/dL — ABNORMAL LOW (ref 3.5–5.0)
Alkaline Phosphatase: 52 U/L (ref 38–126)
Anion gap: 4 — ABNORMAL LOW (ref 5–15)
BUN: 6 mg/dL (ref 6–20)
CO2: 25 mmol/L (ref 22–32)
Calcium: 7.6 mg/dL — ABNORMAL LOW (ref 8.9–10.3)
Chloride: 110 mmol/L (ref 98–111)
Creatinine, Ser: 0.53 mg/dL (ref 0.44–1.00)
GFR, Estimated: >60 mL/min (ref >60)
Glucose, Bld: 102 mg/dL — ABNORMAL HIGH (ref 70–99)
Potassium: 3.4 mmol/L — ABNORMAL LOW (ref 3.5–5.1)
Sodium: 139 mmol/L (ref 135–145)
Total Bilirubin: 0.4 mg/dL (ref 0.3–1.2)
Total Protein: 5.3 g/dL — ABNORMAL LOW (ref 6.5–8.1)

## 2021-06-30 LAB — PREPARE RBC (CROSSMATCH)

## 2021-06-30 LAB — RESP PANEL BY RT-PCR (FLU A&B, COVID) ARPGX2
Influenza A by PCR: NEGATIVE
Influenza B by PCR: NEGATIVE
SARS Coronavirus 2 by RT PCR: NEGATIVE

## 2021-06-30 LAB — CBC
HCT: 18.8 % — ABNORMAL LOW (ref 36.0–46.0)
Hemoglobin: 6.2 g/dL — ABNORMAL LOW (ref 12.0–15.0)
MCH: 32.5 pg (ref 26.0–34.0)
MCHC: 33 g/dL (ref 30.0–36.0)
MCV: 98.4 fL (ref 80.0–100.0)
Platelets: 212 10*3/uL (ref 150–400)
RBC: 1.91 MIL/uL — ABNORMAL LOW (ref 3.87–5.11)
RDW: 15.2 % (ref 11.5–15.5)
WBC: 9.6 10*3/uL (ref 4.0–10.5)
nRBC: 0 % (ref 0.0–0.2)

## 2021-06-30 LAB — HCG, QUANTITATIVE, PREGNANCY: hCG, Beta Chain, Quant, S: 779 m[IU]/mL — ABNORMAL HIGH (ref <5)

## 2021-06-30 LAB — CBG MONITORING, ED: Glucose-Capillary: 94 mg/dL (ref 70–99)

## 2021-06-30 SURGERY — Surgical Case
Anesthesia: *Unknown

## 2021-06-30 SURGERY — DILATION AND EVACUATION, UTERUS
Anesthesia: General

## 2021-06-30 MED ORDER — MIDAZOLAM HCL 2 MG/2ML IJ SOLN
INTRAMUSCULAR | Status: DC | PRN
  Administered 2021-06-30: 2 mg via INTRAVENOUS

## 2021-06-30 MED ORDER — ACETAMINOPHEN 10 MG/ML IV SOLN: 1000.0000 mg | Freq: Once | INTRAVENOUS | Status: DC | PRN

## 2021-06-30 MED ORDER — MORPHINE SULFATE (PF) 4 MG/ML IV SOLN
4.0000 mg | Freq: Once | INTRAVENOUS | Status: AC
  Administered 2021-06-30: 2 mg via INTRAVENOUS
  Filled 2021-06-30: qty 1

## 2021-06-30 MED ORDER — LIDOCAINE HCL (CARDIAC) PF 100 MG/5ML IV SOSY
PREFILLED_SYRINGE | INTRAVENOUS | Status: DC | PRN
  Administered 2021-06-30: 80 mg via INTRAVENOUS

## 2021-06-30 MED ORDER — OXYTOCIN 10 UNIT/ML IJ SOLN: INTRAMUSCULAR | Status: DC | PRN

## 2021-06-30 MED ORDER — OXYCODONE HCL 5 MG/5ML PO SOLN: 5.0000 mg | Freq: Once | ORAL | Status: DC | PRN

## 2021-06-30 MED ORDER — OXYTOCIN 10 UNIT/ML IJ SOLN
INTRAMUSCULAR | Status: AC
  Filled 2021-06-30: qty 2

## 2021-06-30 MED ORDER — DEXAMETHASONE SODIUM PHOSPHATE 10 MG/ML IJ SOLN
INTRAMUSCULAR | Status: AC
  Filled 2021-06-30: qty 1

## 2021-06-30 MED ORDER — EPHEDRINE SULFATE 50 MG/ML IJ SOLN
INTRAMUSCULAR | Status: DC | PRN
Start: 2021-06-30 — End: 2021-06-30
  Administered 2021-06-30 (×2): 5 mg via INTRAVENOUS

## 2021-06-30 MED ORDER — SODIUM CHLORIDE 0.9 % IV SOLN: 10.0000 mL/h | Freq: Once | INTRAVENOUS | Status: DC

## 2021-06-30 MED ORDER — PROPOFOL 10 MG/ML IV BOLUS
INTRAVENOUS | Status: AC
  Filled 2021-06-30: qty 20

## 2021-06-30 MED ORDER — MORPHINE SULFATE (PF) 4 MG/ML IV SOLN
4.0000 mg | Freq: Once | INTRAVENOUS | Status: AC
  Administered 2021-06-30: 4 mg via INTRAVENOUS
  Filled 2021-06-30: qty 1

## 2021-06-30 MED ORDER — FENTANYL CITRATE (PF) 100 MCG/2ML IJ SOLN
INTRAMUSCULAR | Status: DC | PRN
  Administered 2021-06-30: 50 ug via INTRAVENOUS

## 2021-06-30 MED ORDER — SODIUM CHLORIDE 0.9 % IV BOLUS
1000.0000 mL | Freq: Once | INTRAVENOUS | Status: AC
  Administered 2021-06-30: 1000 mL via INTRAVENOUS

## 2021-06-30 MED ORDER — FENTANYL CITRATE (PF) 100 MCG/2ML IJ SOLN
25.0000 ug | INTRAMUSCULAR | Status: DC | PRN
Start: 2021-06-30 — End: 2021-07-01

## 2021-06-30 MED ORDER — ONDANSETRON HCL 4 MG/2ML IJ SOLN
INTRAMUSCULAR | Status: AC
  Filled 2021-06-30: qty 2

## 2021-06-30 MED ORDER — LACTATED RINGERS IV SOLN: INTRAVENOUS | Status: DC

## 2021-06-30 MED ORDER — ONDANSETRON HCL 4 MG/2ML IJ SOLN
INTRAMUSCULAR | Status: DC | PRN
  Administered 2021-06-30: 4 mg via INTRAVENOUS

## 2021-06-30 MED ORDER — PROPOFOL 10 MG/ML IV BOLUS
INTRAVENOUS | Status: DC | PRN
  Administered 2021-06-30: 160 mg via INTRAVENOUS

## 2021-06-30 MED ORDER — OXYCODONE HCL 5 MG PO TABS: 5.0000 mg | ORAL_TABLET | Freq: Once | ORAL | Status: DC | PRN

## 2021-06-30 MED ORDER — SUCCINYLCHOLINE CHLORIDE 200 MG/10ML IV SOSY
PREFILLED_SYRINGE | INTRAVENOUS | Status: DC | PRN
  Administered 2021-06-30: 100 mg via INTRAVENOUS

## 2021-06-30 MED ORDER — FENTANYL CITRATE (PF) 100 MCG/2ML IJ SOLN
INTRAMUSCULAR | Status: AC
  Filled 2021-06-30: qty 2

## 2021-06-30 MED ORDER — DEXAMETHASONE SODIUM PHOSPHATE 10 MG/ML IJ SOLN
INTRAMUSCULAR | Status: DC | PRN
  Administered 2021-06-30: 10 mg via INTRAVENOUS

## 2021-06-30 MED ORDER — MIDAZOLAM HCL 2 MG/2ML IJ SOLN
INTRAMUSCULAR | Status: AC
  Filled 2021-06-30: qty 2

## 2021-06-30 MED ORDER — EPHEDRINE 5 MG/ML INJ
INTRAVENOUS | Status: AC
  Filled 2021-06-30: qty 5

## 2021-06-30 MED ORDER — OXYTOCIN 10 UNIT/ML IJ SOLN
INTRAMUSCULAR | Status: DC | PRN
  Administered 2021-06-30: 20 [IU]

## 2021-06-30 MED ORDER — ONDANSETRON HCL 4 MG/2ML IJ SOLN: 4.0000 mg | Freq: Once | INTRAMUSCULAR | Status: DC | PRN

## 2021-06-30 SURGICAL SUPPLY — 32 items
ADAPTER VACURETTE TBG SET 14 (CANNULA) IMPLANT
CNTNR SPEC 2.5X3XGRAD LEK (MISCELLANEOUS) ×1
CONT SPEC 4OZ STER OR WHT (MISCELLANEOUS) ×1
CONT SPEC 4OZ STRL OR WHT (MISCELLANEOUS) ×1
CONTAINER SPEC 2.5X3XGRAD LEK (MISCELLANEOUS) ×1 IMPLANT
CUP MEDICINE 2OZ PLAST GRAD ST (MISCELLANEOUS) ×2 IMPLANT
DRSG TELFA 3X8 NADH (GAUZE/BANDAGES/DRESSINGS) ×2 IMPLANT
FILTER UTR ASPR SPEC (MISCELLANEOUS) ×1 IMPLANT
FLTR UTR ASPR SPEC (MISCELLANEOUS) ×2
GAUZE 4X4 16PLY ~~LOC~~+RFID DBL (SPONGE) ×4 IMPLANT
GLOVE SURG POLY ORTHO LF SZ7.5 (GLOVE) ×2 IMPLANT
GOWN STRL REUS W/ TWL LRG LVL3 (GOWN DISPOSABLE) ×2 IMPLANT
GOWN STRL REUS W/TWL LRG LVL3 (GOWN DISPOSABLE) ×4
GRADUATE 1200CC STRL 31836 (MISCELLANEOUS) ×2 IMPLANT
KIT BERKELEY 1ST TRIMESTER 3/8 (MISCELLANEOUS) ×2 IMPLANT
KIT TURNOVER KIT A (KITS) ×2 IMPLANT
MANIFOLD NEPTUNE II (INSTRUMENTS) ×2 IMPLANT
NEEDLE HYPO 25X1 1.5 SAFETY (NEEDLE) ×2 IMPLANT
PACK DNC HYST (MISCELLANEOUS) ×2 IMPLANT
PAD OB MATERNITY 4.3X12.25 (PERSONAL CARE ITEMS) ×2 IMPLANT
PAD PREP 24X41 OB/GYN DISP (PERSONAL CARE ITEMS) ×2 IMPLANT
SCRUB EXIDINE 4% CHG 4OZ (MISCELLANEOUS) ×2 IMPLANT
SET BERKELEY SUCTION TUBING (SUCTIONS) ×2 IMPLANT
SET CYSTO W/LG BORE CLAMP LF (SET/KITS/TRAYS/PACK) IMPLANT
SOL PREP PVP 2OZ (MISCELLANEOUS) ×2
SOLUTION PREP PVP 2OZ (MISCELLANEOUS) ×1 IMPLANT
VACURETTE 10 RIGID CVD (CANNULA) IMPLANT
VACURETTE 12 RIGID CVD (CANNULA) ×2 IMPLANT
VACURETTE 7MM F TIP (CANNULA) ×1
VACURETTE 7MM F TIP STRL (CANNULA) ×1 IMPLANT
VACURETTE 8 RIGID CVD (CANNULA) IMPLANT
WATER STERILE IRR 500ML POUR (IV SOLUTION) ×2 IMPLANT

## 2021-06-30 NOTE — ED Notes (Signed)
Per OBGYN do not need CT- was verified by MD that pt did not fall

## 2021-06-30 NOTE — Anesthesia Preprocedure Evaluation (Addendum)
Anesthesia Evaluation  Patient identified by MRN, date of birth, ID band Patient awake    Reviewed: Allergy & Precautions, NPO status , Patient's Chart, lab work & pertinent test results  History of Anesthesia Complications Negative for: history of anesthetic complications  Airway Mallampati: I   Neck ROM: Full    Dental no notable dental hx.    Pulmonary Current Smoker (1-2 cigarettes per day) and Patient abstained from smoking.,    Pulmonary exam normal breath sounds clear to auscultation       Cardiovascular Normal cardiovascular exam Rhythm:Regular Rate:Normal  ECG 06/30/21:  Sinus rhythm Borderline T abnormalities, anterior leads Borderline prolonged QT interval Baseline wander in lead(s) V2 V3   Neuro/Psych  Headaches,    GI/Hepatic GERD  ,  Endo/Other  diabetes, Type 2Hypothyroidism   Renal/GU negative Renal ROS     Musculoskeletal   Abdominal   Peds  Hematology  (+) anemia , Acute blood loss anemia 2/2 vaginal bleeding   Anesthesia Other Findings   Reproductive/Obstetrics                            Anesthesia Physical Anesthesia Plan  ASA: 2 and emergent  Anesthesia Plan: General   Post-op Pain Management:    Induction: Intravenous  PONV Risk Score and Plan: 2 and Ondansetron, Dexamethasone and Treatment may vary due to age or medical condition  Airway Management Planned: Oral ETT  Additional Equipment:   Intra-op Plan:   Post-operative Plan: Extubation in OR  Informed Consent: I have reviewed the patients History and Physical, chart, labs and discussed the procedure including the risks, benefits and alternatives for the proposed anesthesia with the patient or authorized representative who has indicated his/her understanding and acceptance.     Dental advisory given  Plan Discussed with: CRNA  Anesthesia Plan Comments: (Awaiting initiation of pRBC transfusion  (pt with antibodies).  Patient consented for risks of anesthesia including but not limited to:  - adverse reactions to medications - damage to eyes, teeth, lips or other oral mucosa - nerve damage due to positioning  - sore throat or hoarseness - damage to heart, brain, nerves, lungs, other parts of body or loss of life  Informed patient about role of CRNA in peri- and intra-operative care.  Patient voiced understanding.)       Anesthesia Quick Evaluation

## 2021-06-30 NOTE — ED Triage Notes (Signed)
Pt was dx with miscarriage 11/27 and was discharged home to follow up with OB. Did have Rhogam shot states this am bleeding has increased with large clots. EMS states bp while standing was 60's systolic, pt pale in triage. CBG per EMS 195, G7P4.

## 2021-06-30 NOTE — Op Note (Signed)
    OPERATIVE NOTE 06/30/2021 4:24 PM  PRE-OPERATIVE DIAGNOSIS:  1) Dilatation and Evacuation Incomplete Ab  POST-OPERATIVE DIAGNOSIS:  1) Same  OPERATION:  D&E  SURGEON(S): Surgeon(s) and Role:    Linzie Collin, MD - Primary   ANESTHESIA: Choice  ESTIMATED BLOOD LOSS:  OPERATIVE FINDINGS: POC in cervical os  SPECIMEN: POC - Uterine contents  COMPLICATIONS: None  DRAINS: Foley to gravity  DISPOSITION: Stable to recovery room  DESCRIPTION OF PROCEDURE:      The patient was prepped and draped in the dorsal lithotomy position and placed under general anesthesia. Her cervix was grasped with a Jacob's tenaculum. Using ring forceps large placental appearing structure was teased from the cervix in addition to several large blood clots. Respecting the position and curvature of her cervix, it was dilated to accommodate a number 10 suction curette. The suction curette was placed within the endometrial cavity and a pressure greater than 65 mmHg was allowed to build. A systematic curettage was performed in all quadrants until no additional tissue was noted. The uterus became firm and globular. Pitocin was run in the IV. The tenaculum was removed from the cervix and hemostasis was noted. The weighted speculum was removed and the patient went to recovery room in stable condition.  No follow-up provider specified.  Elonda Husky, M.D. 06/30/2021 4:24 PM

## 2021-06-30 NOTE — Transfer of Care (Signed)
Immediate Anesthesia Transfer of Care Note  Patient: Janet Coleman  Procedure(s) Performed: DILATATION AND EVACUATION  Patient Location: PACU  Anesthesia Type:General  Level of Consciousness: awake and alert   Airway & Oxygen Therapy: Patient Spontanous Breathing and Patient connected to nasal cannula oxygen  Post-op Assessment: Report given to RN and Post -op Vital signs reviewed and stable  Post vital signs: Reviewed and stable  Last Vitals:  Vitals Value Taken Time  BP 103/69 06/30/21 1638  Temp 36.6 C 06/30/21 1633  Pulse 83 06/30/21 1645  Resp 13 06/30/21 1645  SpO2 100 % 06/30/21 1645  Vitals shown include unvalidated device data.  Last Pain:  Vitals:   06/30/21 1633  TempSrc:   PainSc: 0-No pain         Complications: No notable events documented.

## 2021-06-30 NOTE — ED Notes (Signed)
U/s at bedside

## 2021-06-30 NOTE — ED Notes (Signed)
Gave report to surgery pre op

## 2021-06-30 NOTE — Anesthesia Procedure Notes (Addendum)
Procedure Name: Intubation Date/Time: 06/30/2021 4:04 PM Performed by: Karoline Caldwell, CRNA Pre-anesthesia Checklist: Patient identified, Patient being monitored, Timeout performed, Emergency Drugs available and Suction available Patient Re-evaluated:Patient Re-evaluated prior to induction Oxygen Delivery Method: Circle system utilized Preoxygenation: Pre-oxygenation with 100% oxygen Induction Type: IV induction and Rapid sequence Laryngoscope Size: 3 and McGraph Grade View: Grade I Tube type: Oral Tube size: 7.0 mm Number of attempts: 1 Airway Equipment and Method: Stylet Placement Confirmation: ETT inserted through vocal cords under direct vision, positive ETCO2 and breath sounds checked- equal and bilateral Secured at: 18 cm Tube secured with: Tape Dental Injury: Teeth and Oropharynx as per pre-operative assessment

## 2021-06-30 NOTE — Telephone Encounter (Signed)
Pts mother called in stating that the pt blacked out and hit her head and was taken by EMS to the hospital and they are stating that she needs a blood transfusion her blood levels are at a 6 now and they will keep her until her levels get up to a 13-14 levels.  Pts mother would like to make sure that Dr. Logan Bores know about this incident.

## 2021-06-30 NOTE — Progress Notes (Signed)
Pt is finishing her 2nd unit RBC when arrived to pacu.  Discussed with dr Logan Bores and per dr Logan Bores as long as pt is not lightheaded she can be discharged without repeat H/H.

## 2021-06-30 NOTE — Progress Notes (Signed)
Patient was given work note prior to discharge. Patient remains asymptomatic and vital signs are stable.

## 2021-06-30 NOTE — H&P (Signed)
@LOGO @      PRE-OPERATIVE HISTORY AND PHYSICAL EXAM  PCP:  Guadalupe Maple, MD Subjective:   HPI:  Janet Coleman is a 35 y.o. [id number].  Patient's last menstrual period was 03/21/2021 (exact date).    She has the following symptoms: Has passed clots and has had bleeding for several days.  Previously passed tissue.  US shows nonspecific debris in endometrial cavity.  Review of Systems:   Constitutional: Denied constitutional symptoms, night sweats, recent illness, fatigue, fever, insomnia and weight loss.  Eyes: Denied eye symptoms, eye pain, photophobia, vision change and visual disturbance.  Ears/Nose/Throat/Neck: Denied ear, nose, throat or neck symptoms, hearing loss, nasal discharge, sinus congestion and sore throat.  Cardiovascular: Denied cardiovascular symptoms, arrhythmia, chest pain/pressure, edema, exercise intolerance, orthopnea and palpitations.  Respiratory: Denied pulmonary symptoms, asthma, pleuritic pain, productive sputum, cough, dyspnea and wheezing.  Gastrointestinal: Denied, gastro-esophageal reflux, melena, nausea and vomiting.  Genitourinary: See HPI for additional information.  Musculoskeletal: Denied musculoskeletal symptoms, stiffness, swelling, muscle weakness and myalgia.  Dermatologic: Denied dermatology symptoms, rash and scar.  Neurologic: Denied neurology symptoms, dizziness, headache, neck pain and syncope.  Psychiatric: Denied psychiatric symptoms, anxiety and depression.  Endocrine: Denied endocrine symptoms including hot flashes and night sweats.   OB History  Gravida Para Term Preterm AB Living  7 4 4   2 4   SAB IAB Ectopic Multiple Live Births  2     0 4    # Outcome Date GA Lbr Len/2nd Weight Sex Delivery Anes PTL Lv  7 Current           6 Term 11/08/20 [redacted]w[redacted]d / 00:26 4200 g M Vag-Spont EPI  LIV  5 Term 01/10/19 [redacted]w[redacted]d 01:20 / 00:56 3280 g F Vag-Spont EPI  LIV  4 Term 2007 [redacted]w[redacted]d  3345 g F Vag-Spont   LIV  3 Term 2006 [redacted]w[redacted]d  3742 g F  Vag-Spont   LIV  2 SAB           1 SAB             Past Medical History:  Diagnosis Date   GERD (gastroesophageal reflux disease)    Hypertension    Hypothyroidism    Localized morphea    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   Migraines    Reactive hypoglycemia    Reactive hypoglycemia    Tobacco use    Vaginal Pap smear, abnormal     Past Surgical History:  Procedure Laterality Date   BELPHAROPTOSIS REPAIR     eye lid lift   DILATION AND CURETTAGE OF UTERUS     x 2   LEEP     x 2. last paps 2011, 2012, 2013 were normal      SOCIAL HISTORY:  Social History   Tobacco Use  Smoking Status Some Days   Packs/day: 0.25   Years: 17.00   Pack years: 4.25   Types: Cigarettes  Smokeless Tobacco Never   Social History   Substance and Sexual Activity  Alcohol Use Not Currently   Comment: occass    Social History   Substance and Sexual Activity  Drug Use No    Family History  Problem Relation Age of Onset   Hypertension Mother    Autoimmune disease Mother    Stroke Father    Hypertension Father    Heart attack Father    Aneurysm Father        brain   Heart disease Father  Cancer Maternal Aunt        breast   Cancer Maternal Grandmother        breast   Diabetes Maternal Grandmother    Stroke Maternal Grandfather    Diabetes Paternal Grandmother    Heart disease Paternal Grandmother     ALLERGIES:  Toradol [ketorolac tromethamine] and Ketorolac  MEDS:   Current Facility-Administered Medications on File Prior to Encounter  Medication Dose Route Frequency Provider Last Rate Last Admin   rho (d) immune globulin (RHIG/RHOPHYLAC) injection 300 mcg  300 mcg Intramuscular Once Harlin Heys, MD       Current Outpatient Medications on File Prior to Encounter  Medication Sig Dispense Refill   HYDROcodone-acetaminophen (NORCO/VICODIN) 5-325 MG tablet Take 1 tablet by mouth every 4 (four) hours as needed for moderate pain.  (Patient not taking: Reported on 06/24/2021) 20 tablet 0   ondansetron (ZOFRAN-ODT) 8 MG disintegrating tablet Take 1 tablet (8 mg total) by mouth every 8 (eight) hours as needed for nausea or vomiting. 20 tablet 0   pantoprazole (PROTONIX) 40 MG tablet pantoprazole 40 mg tablet,delayed release  TAKE 1 TABLET BY MOUTH EVERY DAY     Prenatal-DSS-FeCb-FeGl-FA (CITRANATAL BLOOM) 90-1 MG TABS Take 1 tablet by mouth daily. (Patient not taking: Reported on 06/24/2021) 30 tablet 11   sertraline (ZOLOFT) 100 MG tablet Take 100 mg by mouth daily.      Meds ordered this encounter  Medications   sodium chloride 0.9 % bolus 1,000 mL   morphine 4 MG/ML injection 4 mg   0.9 %  sodium chloride infusion   morphine 4 MG/ML injection 4 mg     Physical examination BP (!) 93/53   Pulse 88   Temp 98.1 F (36.7 C) (Oral)   Resp 19   Ht 5\' 6"  (1.676 m)   Wt 79.4 kg   LMP 03/21/2021 (Exact Date)   SpO2 100%   BMI 28.25 kg/m   General NAD, Conversant  HEENT Atraumatic; Op clear with mmm.  Normo-cephalic. Pupils reactive. Anicteric sclerae  Thyroid/Neck Smooth without nodularity or enlargement. Normal ROM.  Neck Supple.  Skin No rashes, lesions or ulceration. Normal palpated skin turgor. No nodularity. Pale  Breasts: No masses or discharge.  Symmetric.  No axillary adenopathy.  Lungs: Clear to auscultation.No rales or wheezes. Normal Respiratory effort, no retractions.  Heart: NSR.  No murmurs or rubs appreciated. No periferal edema  Abdomen: Soft.  Non-tender.  No masses.  No HSM. No hernia  Extremities: Moves all appropriately.  Normal ROM for age. No lymphadenopathy.  Neuro: Oriented to PPT.  Normal mood. Normal affect.     Pelvic: Deferred to OR   Assessment:   [id number] Patient Active Problem List   Diagnosis Date Noted   Incomplete abortion 06/30/2021   Acute blood loss anemia 06/30/2021   Post-dates pregnancy 11/08/2020   Supervision of elderly multigravida (>=75 years old at time of  delivery), second trimester 05/22/2020   Premature rupture of membranes 01/10/2019   Back pain in pregnancy 12/20/2018   Rh negative state in antepartum period 06/28/2018   Elevated cortisol level 02/21/2015   Hypoglycemia 02/08/2015   GERD (gastroesophageal reflux disease)    Hypertension    Hypothyroidism    Tobacco use    Migraines     1. Miscarriage   2. Vaginal bleeding   3. Vaginal bleeding in pregnancy    Incomplete Ab  Plan:   Orders: Meds ordered this encounter  Medications   sodium chloride  0.9 % bolus 1,000 mL   morphine 4 MG/ML injection 4 mg   0.9 %  sodium chloride infusion   morphine 4 MG/ML injection 4 mg     1.  D&E   For imcomplete 2.  Packed RBCs  Elonda Husky, M.D. 06/30/2021 2:51 PM

## 2021-06-30 NOTE — Telephone Encounter (Signed)
Pt called states that she is currently in ER- was brought in by EMS- states that she has been having a miscarriage- still having a lot of pian, passing clots, bp low and passing out. Pt states that EMS mentioned she might need blood transfusion. Please Advise.

## 2021-06-30 NOTE — ED Provider Notes (Signed)
[facility] Emergency Department Provider Note   ____________________________________________   Event Date/Time   First MD Initiated Contact with Patient 06/30/21 [phone]     (approximate)  I have reviewed the triage vital signs and the nursing notes.   HISTORY  Chief Complaint Vaginal Bleeding    HPI Janet Coleman is a 35 y.o. female, [id number] at approximately 14 weeks of pregnancy, who presents to the ED complaining of vaginal bleeding.  She states that she initially developed bleeding about 8 days ago and was seen in the ED at that time, ultrasound was concerning for failed pregnancy with no fetal heart tones identified.  Patient was stable at the time of discharge, but has been dealing with intermittent heavy bleeding since then.  She complains of crampy pain in her pelvic area and has not noticed passage of tissue, but reports passing numerous large clots.  Bleeding has been severe since she woke up this morning and she currently describes sensation of "blood pouring out."  [facility] before coming into the ED.  She reports feeling very lightheaded at times, had a syncopal episode yesterday and again this morning.  She denies any pain in her chest or difficulty breathing.        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   Migraines    Reactive hypoglycemia    Reactive hypoglycemia    Tobacco use    Vaginal Pap smear, abnormal     Patient Active Problem List   Diagnosis Date Noted   Post-dates pregnancy 11/08/2020   Supervision of elderly multigravida (>=38 years old at time of delivery), second trimester 05/22/2020   Premature rupture of membranes 01/10/2019   Back pain in pregnancy 12/20/2018   Rh negative state in antepartum period 06/28/2018   Elevated  cortisol level 02/21/2015   Hypoglycemia 02/08/2015   GERD (gastroesophageal reflux disease)    Hypertension    Hypothyroidism    Tobacco use    Migraines     Past Surgical History:  Procedure Laterality Date   BELPHAROPTOSIS REPAIR     eye lid lift   DILATION AND CURETTAGE OF UTERUS     x 2   LEEP     x 2. last paps 2011, 2012, 2013 were normal    Prior to Admission medications   Medication Sig Start Date End Date Taking? Authorizing Provider  HYDROcodone-acetaminophen (NORCO/VICODIN) 5-325 MG tablet Take 1 tablet by mouth every 4 (four) hours as needed for moderate pain. Patient not taking: Reported on 06/24/2021 06/22/21 06/22/22  Merwyn Katos, MD  ondansetron (ZOFRAN-ODT) 8 MG disintegrating tablet Take 1 tablet (8 mg total) by mouth every 8 (eight) hours as needed for nausea or vomiting. 06/22/21   Merwyn Katos, MD  pantoprazole (PROTONIX) 40 MG tablet pantoprazole 40 mg tablet,delayed release  TAKE 1 TABLET BY MOUTH EVERY DAY    [provider]  Prenatal-DSS-FeCb-FeGl-FA (CITRANATAL BLOOM) 90-1 MG TABS Take 1 tablet by mouth daily. Patient not taking: Reported on 06/24/2021 04/29/21   Linzie Collin, MD  sertraline (ZOLOFT) 100 MG tablet Take 100 mg by mouth daily. 06/12/21   [provider]    Allergies Toradol [ketorolac tromethamine] and Ketorolac  Family History  Problem Relation Age of Onset   Hypertension Mother  Autoimmune disease Mother    Stroke Father    Hypertension Father    Heart attack Father    Aneurysm Father        brain   Heart disease Father    Cancer Maternal Aunt        breast   Cancer Maternal Grandmother        breast   Diabetes Maternal Grandmother    Stroke Maternal Grandfather    Diabetes Paternal Grandmother    Heart disease Paternal Grandmother     Social History Social History   Tobacco Use   Smoking status: Some Days    Packs/day: 0.25    Years: 17.00    Pack years: 4.25    Types: Cigarettes    Smokeless tobacco: Never  Vaping Use   Vaping Use: Some days  Substance Use Topics   Alcohol use: Not Currently    Comment: occass   Drug use: No    Review of Systems  Constitutional: No fever/chills Eyes: No visual changes. ENT: No sore throat. Cardiovascular: Denies chest pain. Respiratory: Denies shortness of breath. Gastrointestinal: Positive for abdominal pain.  No nausea, no vomiting.  No diarrhea.  No constipation. Genitourinary: Negative for dysuria.  Positive for vaginal bleeding. Musculoskeletal: Negative for back pain. Skin: Negative for rash. Neurological: Negative for headaches, focal weakness or numbness.  ____________________________________________   PHYSICAL EXAM:  VITAL SIGNS: ED Triage Vitals  Enc Vitals Group     BP 06/30/21 0741 120/68     Pulse Rate 06/30/21 0741 94     Resp 06/30/21 0741 20     Temp 06/30/21 0741 98.1 F (36.7 C)     Temp Source 06/30/21 0741 Oral     SpO2 06/30/21 0741 100 %     Weight 06/30/21 0742 175 lb (79.4 kg)     Height 06/30/21 0742 5\' 6"  (1.676 m)     Head Circumference --      Peak Flow --      Pain Score 06/30/21 0742 8     Pain Loc --      Pain Edu? --      Excl. in GC? --     Constitutional: Alert and oriented. Eyes: Conjunctivae are normal. Head: Atraumatic. Nose: No congestion/rhinnorhea. Mouth/Throat: Mucous membranes are moist. Neck: Normal ROM Cardiovascular: Normal rate, regular rhythm. Grossly normal heart sounds.  2+ radial pulses bilaterally. Respiratory: Normal respiratory effort.  No retractions. Lungs CTAB. Gastrointestinal: Soft and tender to palpation in the suprapubic area with no rebound or guarding. No distention. Genitourinary: deferred Musculoskeletal: No lower extremity tenderness nor edema. Neurologic:  Normal speech and language. No gross focal neurologic deficits are appreciated. Skin:  Skin is warm, dry and intact. No rash noted. Psychiatric: Mood and affect are normal. Speech  and behavior are normal.  ____________________________________________   LABS (all labs ordered are listed, but only abnormal results are displayed)  Labs Reviewed  CBC - Abnormal; Notable for the following components:      Result Value   RBC 1.91 (*)    Hemoglobin 6.2 (*)    HCT 18.8 (*)    All other components within normal limits  COMPREHENSIVE METABOLIC PANEL - Abnormal; Notable for the following components:   Potassium 3.4 (*)    Glucose, Bld 102 (*)    Calcium 7.6 (*)    Total Protein 5.3 (*)    Albumin 2.9 (*)    AST 14 (*)    Anion gap 4 (*)  All other components within normal limits  HCG, QUANTITATIVE, PREGNANCY - Abnormal; Notable for the following components:   hCG, Beta Chain, Quant, S 779 (*)    All other components within normal limits  RESP PANEL BY RT-PCR (FLU A&B, COVID) ARPGX2  TYPE AND SCREEN  PREPARE RBC (CROSSMATCH)   ____________________________________________  EKG  ED ECG REPORT I, Chesley Noon, the attending physician, personally viewed and interpreted this ECG.   Date: 06/30/2021  EKG Time: 11:19  Rate: 93  Rhythm: normal sinus rhythm  Axis: Normal  Intervals: Borderline prolonged QT  ST&T Change: None   PROCEDURES  Procedure(s) performed (including Critical Care):  .Critical Care Performed by: Chesley Noon, MD Authorized by: Chesley Noon, MD   Critical care provider statement:    Critical care time (minutes):  45   Critical care time was exclusive of:  Separately billable procedures and treating other patients and teaching time   Critical care was necessary to treat or prevent imminent or life-threatening deterioration of the following conditions:  Circulatory failure   Critical care was time spent personally by me on the following activities:  Development of treatment plan with patient or surrogate, discussions with consultants, evaluation of patient's response to treatment, examination of patient, ordering and review of  laboratory studies, ordering and review of radiographic studies, ordering and performing treatments and interventions, pulse oximetry, re-evaluation of patient's condition and review of old charts   I assumed direction of critical care for this patient from another provider in my specialty: no     Care discussed with: admitting provider     ____________________________________________   INITIAL IMPRESSION / ASSESSMENT AND PLAN / ED COURSE      35 year old female, [id number] at approximately 14 weeks of pregnancy presents to the ED with severe ongoing bleeding and crampy pain in her pelvic area after being diagnosed with failed pregnancy during ED visit about 1 week ago.  Patient is hemodynamically stable blood labs show significant drop in hemoglobin to 6.2.  She was given IV fluid bolus and we will transfuse 2 units PRBCs given her ongoing bleeding.  Ultrasound is pending at this time, plan to discuss with OB/GYN for potential D&C.  Case discussed with Dr. Logan Bores of OB/GYN, who agrees with plan for blood transfusion and will speak with the OR about potential D&C.      ____________________________________________   FINAL CLINICAL IMPRESSION(S) / ED DIAGNOSES  Final diagnoses:  Miscarriage  Vaginal bleeding in pregnancy     ED Discharge Orders     None        Note:  This document was prepared using Dragon voice recognition software and may include unintentional dictation errors.    Chesley Noon, MD 06/30/21 [phone]

## 2021-06-30 NOTE — Progress Notes (Signed)
This RN and EVE RN spoke with dr Logan Bores via phone call.  Per Dr Logan Bores pt is ok to be discharged home and needs 2 days out of work and to take it easy for 2 days.  She does not need lifting restrictions and is ok to take motrin/tylenol for pain per dr Logan Bores.  Notified him that after blood she is asymptomatic; no repeat H/H.

## 2021-06-30 NOTE — ED Notes (Signed)
12 lead EKG accidentally run under patient's MRN due to not being discharged from telemetry machine in ER prior to transfer to OR. Medical records notified. OR notified.

## 2021-06-30 NOTE — Discharge Instructions (Addendum)
Okay to take motrin and tylenol as needed per Dr. Logan Bores. No Lifting restrictions AMBULATORY SURGERY  DISCHARGE INSTRUCTIONS   The drugs that you were given will stay in your system until tomorrow so for the next 24 hours you should not:  Drive an automobile Make any legal decisions Drink any alcoholic beverage   You may resume regular meals tomorrow.  Today it is better to start with liquids and gradually work up to solid foods.  You may eat anything you prefer, but it is better to start with liquids, then soup and crackers, and gradually work up to solid foods.   Please notify your doctor immediately if you have any unusual bleeding, trouble breathing, redness and pain at the surgery site, drainage, fever, or pain not relieved by medication.    Additional Instructions:        Please contact your physician with any problems or Same Day Surgery at [phone], Monday through Friday 6 am to 4 pm, or [place] at [facility] number at [phone].

## 2021-07-01 ENCOUNTER — Encounter: Payer: Self-pay | Admitting: Obstetrics and Gynecology

## 2021-07-01 LAB — BPAM RBC
Blood Product Expiration Date: 202212132359
Blood Product Expiration Date: 202212132359
ISSUE DATE / TIME: 202212051447
ISSUE DATE / TIME: 202212051554
Unit Type and Rh: 9500
Unit Type and Rh: 9500

## 2021-07-01 LAB — TYPE AND SCREEN
ABO/RH(D): O NEG
Antibody Screen: POSITIVE
Unit division: 0
Unit division: 0

## 2021-07-01 NOTE — Anesthesia Postprocedure Evaluation (Signed)
Anesthesia Post Note  Patient: Janet Coleman  Procedure(s) Performed: DILATATION AND EVACUATION  Patient location during evaluation: PACU Anesthesia Type: General Level of consciousness: awake and alert, oriented and patient cooperative Pain management: pain level controlled Vital Signs Assessment: post-procedure vital signs reviewed and stable Respiratory status: spontaneous breathing, nonlabored ventilation and respiratory function stable Cardiovascular status: blood pressure returned to baseline and stable Postop Assessment: adequate PO intake Anesthetic complications: no   No notable events documented.   Last Vitals:  Vitals:   06/30/21 1800 06/30/21 1835  BP: (!) 115/59 118/64  Pulse: 81 69  Resp: 18 15  Temp:  36.9 C  SpO2: 99% 100%    Last Pain:  Vitals:   06/30/21 1835  TempSrc: Temporal  PainSc: 0-No pain                 Reed Breech

## 2021-07-02 LAB — SURGICAL PATHOLOGY

## 2021-07-09 ENCOUNTER — Telehealth: Payer: Self-pay | Admitting: Obstetrics and Gynecology

## 2021-07-09 NOTE — Telephone Encounter (Signed)
Pt is calling in stating that she is still having the real bad headaches (migraines) and after the [facility] she is having a lot of swelling in her legs, ankles and feet and would like to know what she should be doing for it.  Pt also would like to know if she should keep her lab appointment for Thursday 07/10/2021 or just do everything on 07/15/2021 when she come in.  Pt would like to have a call back.

## 2021-07-10 ENCOUNTER — Other Ambulatory Visit: Payer: Medicaid Other

## 2021-07-15 ENCOUNTER — Other Ambulatory Visit: Payer: Medicaid Other

## 2021-07-15 ENCOUNTER — Other Ambulatory Visit: Payer: Self-pay

## 2021-07-15 ENCOUNTER — Ambulatory Visit (INDEPENDENT_AMBULATORY_CARE_PROVIDER_SITE_OTHER): Payer: Medicaid Other | Admitting: Obstetrics and Gynecology

## 2021-07-15 ENCOUNTER — Encounter: Payer: Self-pay | Admitting: Obstetrics and Gynecology

## 2021-07-15 VITALS — BP 126/71 | HR 93 | Ht 66.0 in | Wt 171.6 lb

## 2021-07-15 DIAGNOSIS — Z9889 Other specified postprocedural states: Secondary | ICD-10-CM

## 2021-07-15 DIAGNOSIS — O039 Complete or unspecified spontaneous abortion without complication: Secondary | ICD-10-CM | POA: Diagnosis not present

## 2021-07-15 NOTE — Progress Notes (Signed)
HPI:      [name] is a 35 y.o. [id number] who LMP was Patient's last menstrual period was 03/21/2021 (exact date).  Subjective:   She presents today stating that her bleeding has markedly decreased.  She is no longer passing any tissue.  She does complain of a daily headache she takes Tylenol and Motrin for this but it seems to come back every single day.  She does say she is doing a lot better and she was a week ago. She continues to take 2 iron pills daily with meals.    Hx: The following portions of the patient's history were reviewed and updated as appropriate:             She  has a past medical history of GERD (gastroesophageal reflux disease), Hypertension, Hypothyroidism, Localized morphea, Migraines, Reactive hypoglycemia, Reactive hypoglycemia, Tobacco use, and Vaginal Pap smear, abnormal. She does not have any pertinent problems on file. She  has a past surgical history that includes Dilation and curettage of uterus; LEEP; Blepharoptosis repair; and Dilation and evacuation (N/A, 06/30/2021). Her family history includes Aneurysm in her father; Autoimmune disease in her mother; Cancer in her maternal aunt and maternal grandmother; Diabetes in her maternal grandmother and paternal grandmother; Heart attack in her father; Heart disease in her father and paternal grandmother; Hypertension in her father and mother; Stroke in her father and maternal grandfather. She  reports that she has been smoking cigarettes. She has a 4.25 pack-year smoking history. She has never used smokeless tobacco. She reports that she does not currently use alcohol. She reports that she does not use drugs. She has a current medication list which includes the following prescription(s): ferrous sulfate, citranatal bloom, and sertraline. She is allergic to toradol [ketorolac tromethamine] and ketorolac.       Review of Systems:  Review of Systems  Constitutional: Denied constitutional symptoms, night sweats,  recent illness, fatigue, fever, insomnia and weight loss.  Eyes: Denied eye symptoms, eye pain, photophobia, vision change and visual disturbance.  Ears/Nose/Throat/Neck: Denied ear, nose, throat or neck symptoms, hearing loss, nasal discharge, sinus congestion and sore throat.  Cardiovascular: Denied cardiovascular symptoms, arrhythmia, chest pain/pressure, edema, exercise intolerance, orthopnea and palpitations.  Respiratory: Denied pulmonary symptoms, asthma, pleuritic pain, productive sputum, cough, dyspnea and wheezing.  Gastrointestinal: Denied, gastro-esophageal reflux, melena, nausea and vomiting.  Genitourinary: Denied genitourinary symptoms including symptomatic vaginal discharge, pelvic relaxation issues, and urinary complaints.  Musculoskeletal: Denied musculoskeletal symptoms, stiffness, swelling, muscle weakness and myalgia.  Dermatologic: Denied dermatology symptoms, rash and scar.  Neurologic: Denied neurology symptoms, dizziness, headache, neck pain and syncope.  Psychiatric: Denied psychiatric symptoms, anxiety and depression.  Endocrine: Denied endocrine symptoms including hot flashes and night sweats.   Meds:   Current Outpatient Medications on File Prior to Visit  Medication Sig Dispense Refill   ferrous sulfate 220 (44 Fe) MG/5ML solution Take 220 mg by mouth daily.     Prenatal-DSS-FeCb-FeGl-FA (CITRANATAL BLOOM) 90-1 MG TABS Take 1 tablet by mouth daily. 30 tablet 11   sertraline (ZOLOFT) 100 MG tablet Take 100 mg by mouth daily.     No current facility-administered medications on file prior to visit.      Objective:     Vitals:   07/15/21 1352  BP: 126/71  Pulse: 93   Filed Weights   07/15/21 1352  Weight: 171 lb 9.6 oz (77.8 kg)  Assessment:    [id number] Patient Active Problem List   Diagnosis Date Noted   Incomplete abortion 06/30/2021   Acute blood loss anemia 06/30/2021   Post-dates pregnancy 11/08/2020   Supervision  of elderly multigravida (>=57 years old at time of delivery), second trimester 05/22/2020   Premature rupture of membranes 01/10/2019   Back pain in pregnancy 12/20/2018   Rh negative state in antepartum period 06/28/2018   Elevated cortisol level 02/21/2015   Hypoglycemia 02/08/2015   GERD (gastroesophageal reflux disease)    Hypertension    Hypothyroidism    Tobacco use    Migraines      1. Post-operative state     Patient doing much better bleeding significantly decreased.  Headache possibly from acute blood loss anemia.   Plan:            1.  Continue iron.  2.  Advised continued increased hydration.  3.  H&H ordered.  Consider iron infusion if necessary. Orders Orders Placed This Encounter  Procedures   Hemoglobin and hematocrit, blood    No orders of the defined types were placed in this encounter.     F/U  Return in about 4 weeks (around 08/12/2021).  Finis Bud, M.D. 07/15/2021 2:27 PM

## 2021-07-15 NOTE — Progress Notes (Signed)
Patient presents for recent D&C follow-up. Patient states she is still experiencing slight bleeding and has had a headache since the start of the miscarriage. Patient takes nothing has eased the pain of the headache, she had tried ibuprofen, Excedrin migraine and BC powder.

## 2021-07-16 LAB — HEMOGLOBIN AND HEMATOCRIT, BLOOD
Hematocrit: 27.8 % — ABNORMAL LOW (ref 34.0–46.6)
Hemoglobin: 8.8 g/dL — ABNORMAL LOW (ref 11.1–15.9)

## 2021-07-16 LAB — BETA HCG QUANT (REF LAB): hCG Quant: 4 m[IU]/mL

## 2021-07-20 NOTE — Progress Notes (Signed)
Janet Coleman: Your blood work shows that you are still anemic, but certainly much improved from when you were last in the emergency department.  Continue taking the iron as directed.  And it will continue to increase.

## 2021-07-23 ENCOUNTER — Telehealth: Payer: Self-pay | Admitting: Obstetrics and Gynecology

## 2021-07-23 NOTE — Telephone Encounter (Signed)
Pt is calling in to see if she can get her lab results from 07/15/2021.  Pt would like to have a call back.

## 2021-08-07 ENCOUNTER — Encounter: Payer: Medicaid Other | Admitting: Obstetrics and Gynecology

## 2021-08-12 ENCOUNTER — Encounter: Payer: Medicaid Other | Admitting: Obstetrics and Gynecology

## 2021-10-11 ENCOUNTER — Other Ambulatory Visit: Payer: Self-pay | Admitting: Obstetrics and Gynecology

## 2021-11-07 ENCOUNTER — Ambulatory Visit (INDEPENDENT_AMBULATORY_CARE_PROVIDER_SITE_OTHER): Payer: Medicaid Other | Admitting: Family Medicine

## 2021-11-07 ENCOUNTER — Encounter: Payer: Self-pay | Admitting: Family Medicine

## 2021-11-07 VITALS — BP 107/73 | HR 71 | Wt 183.0 lb

## 2021-11-07 DIAGNOSIS — E039 Hypothyroidism, unspecified: Secondary | ICD-10-CM

## 2021-11-07 DIAGNOSIS — R519 Headache, unspecified: Secondary | ICD-10-CM | POA: Diagnosis not present

## 2021-11-07 DIAGNOSIS — D62 Acute posthemorrhagic anemia: Secondary | ICD-10-CM

## 2021-11-07 DIAGNOSIS — Z302 Encounter for sterilization: Secondary | ICD-10-CM

## 2021-11-07 DIAGNOSIS — E162 Hypoglycemia, unspecified: Secondary | ICD-10-CM | POA: Diagnosis not present

## 2021-11-07 DIAGNOSIS — R5383 Other fatigue: Secondary | ICD-10-CM | POA: Diagnosis not present

## 2021-11-07 LAB — URINALYSIS, ROUTINE W REFLEX MICROSCOPIC
Bilirubin, UA: NEGATIVE
Glucose, UA: NEGATIVE
Ketones, UA: NEGATIVE
Leukocytes,UA: NEGATIVE
Nitrite, UA: NEGATIVE
Protein,UA: NEGATIVE
RBC, UA: NEGATIVE
Specific Gravity, UA: 1.02 (ref 1.005–1.030)
Urobilinogen, Ur: 0.2 mg/dL (ref 0.2–1.0)
pH, UA: 6.5 (ref 5.0–7.5)

## 2021-11-07 LAB — BAYER DCA HB A1C WAIVED: HB A1C (BAYER DCA - WAIVED): 4.9 % (ref 4.8–5.6)

## 2021-11-07 NOTE — Progress Notes (Signed)
? ?BP 107/73   Pulse 71   Wt 183 lb (83 kg)   LMP 03/21/2021 (Exact Date)   SpO2 98%   BMI 29.54 kg/m?   ? ?Subjective:  ? ? Patient ID: Janet Coleman, female    DOB: 1986/06/16, 36 y.o.   MRN: [id number] ? ?HPI: ?Janet Coleman is a 36 y.o. female ? ?Chief Complaint  ?Patient presents with  ? Headache  ?  Patient states she had a miscarriage in December and lost a lot of blood, patient states she has been having daily headaches. Patient states she takes tylenol and ibuprofen but does not get relief   ? Hypoglycemia  ?  Patient states her blood sugar has been running low and would like labs, normally her sugars run in the 70's   ? Contraception  ?  Patient states she would like to talk about getting back on the birth control pill, has not been able to get back into seeing OB   ? ?Has been having headaches since December 2022. Having headaches on the top of her head, the whole head. Nothing makes them better. Noise makes them worse. Touch makes it worse. Headaches are constant. They wax and wane, but they do not go away. Radiate into her neck. No aura. No nausea or vomiting, no confusion.  ? ?CONTRACEPTION CONCERNS ?Contraception: condoms ?Previous contraception: condoms ?Sexual activity: practicing safe sex ?Gravida/Para: X5M8 ?Average interval between menses: 28 days ?Length of menses: 3-4 days ?Flow: mild to moderate ?Dysmenorrhea: no ? ?Relevant past medical, surgical, family and social history reviewed and updated as indicated. Interim medical history since our last visit reviewed. ?Allergies and medications reviewed and updated. ? ?Review of Systems  ?Constitutional: Negative.   ?Respiratory: Negative.    ?Cardiovascular: Negative.   ?Musculoskeletal: Negative.   ?Neurological: Negative.   ?Psychiatric/Behavioral: Negative.    ? ?Per HPI unless specifically indicated above ? ?   ?Objective:  ?  ?BP 107/73   Pulse 71   Wt 183 lb (83 kg)   LMP 03/21/2021 (Exact Date)   SpO2 98%   BMI 29.54 kg/m?    ?Wt Readings from Last 3 Encounters:  ?11/07/21 183 lb (83 kg)  ?07/15/21 171 lb 9.6 oz (77.8 kg)  ?06/30/21 175 lb (79.4 kg)  ?  ?Physical Exam ?Vitals and nursing note reviewed.  ?Constitutional:   ?   General: She is not in acute distress. ?   Appearance: Normal appearance. She is not ill-appearing, toxic-appearing or diaphoretic.  ?HENT:  ?   Head: Normocephalic and atraumatic.  ?   Right Ear: External ear normal.  ?   Left Ear: External ear normal.  ?   Nose: Nose normal.  ?   Mouth/Throat:  ?   Mouth: Mucous membranes are moist.  ?   Pharynx: Oropharynx is clear.  ?Eyes:  ?   General: No scleral icterus.    ?   Right eye: No discharge.     ?   Left eye: No discharge.  ?   Extraocular Movements: Extraocular movements intact.  ?   Conjunctiva/sclera: Conjunctivae normal.  ?   Pupils: Pupils are equal, round, and reactive to light.  ?Cardiovascular:  ?   Rate and Rhythm: Normal rate and regular rhythm.  ?   Pulses: Normal pulses.  ?   Heart sounds: Normal heart sounds. No murmur heard. ?  No friction rub. No gallop.  ?Pulmonary:  ?   Effort: Pulmonary effort is normal. No respiratory distress.  ?  Breath sounds: Normal breath sounds. No stridor. No wheezing, rhonchi or rales.  ?Chest:  ?   Chest wall: No tenderness.  ?Musculoskeletal:     ?   General: Normal range of motion.  ?   Cervical back: Normal range of motion and neck supple.  ?Skin: ?   General: Skin is warm and dry.  ?   Capillary Refill: Capillary refill takes less than 2 seconds.  ?   Coloration: Skin is not jaundiced or pale.  ?   Findings: No bruising, erythema, lesion or rash.  ?Neurological:  ?   General: No focal deficit present.  ?   Mental Status: She is alert and oriented to person, place, and time. Mental status is at baseline.  ?Psychiatric:     ?   Mood and Affect: Mood normal.     ?   Behavior: Behavior normal.     ?   Thought Content: Thought content normal.     ?   Judgment: Judgment normal.  ? ? ?Results for orders placed or performed  in visit on 11/07/21  ?Comprehensive metabolic panel  ?Result Value Ref Range  ? Glucose 79 70 - 99 mg/dL  ? BUN 12 6 - 20 mg/dL  ? Creatinine, Ser 0.63 0.57 - 1.00 mg/dL  ? eGFR 118 >59 mL/min/1.73  ? BUN/Creatinine Ratio 19 9 - 23  ? Sodium 141 134 - 144 mmol/L  ? Potassium 4.2 3.5 - 5.2 mmol/L  ? Chloride 106 96 - 106 mmol/L  ? CO2 23 20 - 29 mmol/L  ? Calcium 8.8 8.7 - 10.2 mg/dL  ? Total Protein 6.6 6.0 - 8.5 g/dL  ? Albumin 4.3 3.8 - 4.8 g/dL  ? Globulin, Total 2.3 1.5 - 4.5 g/dL  ? Albumin/Globulin Ratio 1.9 1.2 - 2.2  ? Bilirubin Total <0.2 0.0 - 1.2 mg/dL  ? Alkaline Phosphatase 97 44 - 121 IU/L  ? AST 16 0 - 40 IU/L  ? ALT 9 0 - 32 IU/L  ?CBC with Differential/Platelet  ?Result Value Ref Range  ? WBC 6.3 3.4 - 10.8 x10E3/uL  ? RBC 4.30 3.77 - 5.28 x10E6/uL  ? Hemoglobin 11.6 11.1 - 15.9 g/dL  ? Hematocrit 35.7 34.0 - 46.6 %  ? MCV 83 79 - 97 fL  ? MCH 27.0 26.6 - 33.0 pg  ? MCHC 32.5 31.5 - 35.7 g/dL  ? RDW 15.5 (H) 11.7 - 15.4 %  ? Platelets 200 150 - 450 x10E3/uL  ? Neutrophils 57 Not Estab. %  ? Lymphs 35 Not Estab. %  ? Monocytes 5 Not Estab. %  ? Eos 2 Not Estab. %  ? Basos 1 Not Estab. %  ? Neutrophils Absolute 3.6 1.4 - 7.0 x10E3/uL  ? Lymphocytes Absolute 2.2 0.7 - 3.1 x10E3/uL  ? Monocytes Absolute 0.3 0.1 - 0.9 x10E3/uL  ? EOS (ABSOLUTE) 0.1 0.0 - 0.4 x10E3/uL  ? Basophils Absolute 0.0 0.0 - 0.2 x10E3/uL  ? Immature Granulocytes 0 Not Estab. %  ? Immature Grans (Abs) 0.0 0.0 - 0.1 x10E3/uL  ?Bayer DCA Hb A1c Waived  ?Result Value Ref Range  ? HB A1C (BAYER DCA - WAIVED) 4.9 4.8 - 5.6 %  ?TSH  ?Result Value Ref Range  ? TSH 3.780 0.450 - 4.500 uIU/mL  ?Urinalysis, Routine w reflex microscopic  ?Result Value Ref Range  ? Specific Gravity, UA 1.020 1.005 - 1.030  ? pH, UA 6.5 5.0 - 7.5  ? Color, UA Yellow Yellow  ? Appearance Ur  Cloudy (A) Clear  ? Leukocytes,UA Negative Negative  ? Protein,UA Negative Negative/Trace  ? Glucose, UA Negative Negative  ? Ketones, UA Negative Negative  ? RBC, UA  Negative Negative  ? Bilirubin, UA Negative Negative  ? Urobilinogen, Ur 0.2 0.2 - 1.0 mg/dL  ? Nitrite, UA Negative Negative  ?VITAMIN D 25 Hydroxy (Vit-D Deficiency, Fractures)  ?Result Value Ref Range  ? Vit D, 25-Hydroxy 49.0 30.0 - 100.0 ng/mL  ?B12  ?Result Value Ref Range  ? Vitamin B-12 643 232 - 1,245 pg/mL  ?Iron Binding Cap (TIBC)([facility])  ?Result Value Ref Range  ? Total Iron Binding Capacity 350 250 - 450 ug/dL  ? UIBC 299 131 - 425 ug/dL  ? Iron 51 27 - 159 ug/dL  ? Iron Saturation 15 15 - 55 %  ?Ferritin  ?Result Value Ref Range  ? Ferritin 10 (L) 15 - 150 ng/mL  ? ?   ?Assessment & Plan:  ? ?Problem List Items Addressed This Visit   ? ?  ? Endocrine  ? Hypothyroidism  ?  Rechecking labs today. Await results. Treat as needed.  ? ?  ?  ? Relevant Orders  ? Comprehensive metabolic panel (Completed)  ? TSH (Completed)  ? Hypoglycemia  ?  Rechecking labs today. Await results. Treat as needed.  ?  ?  ? Relevant Orders  ? Comprehensive metabolic panel (Completed)  ? Bayer DCA Hb A1c Waived (Completed)  ?  ? Other  ? Acute blood loss anemia  ?  Rechecking labs today. Await results. Treat as needed.  ?  ?  ? Relevant Orders  ? Comprehensive metabolic panel (Completed)  ? CBC with Differential/Platelet (Completed)  ? B12 (Completed)  ? Iron Binding Cap (TIBC)([facility]) (Completed)  ? Ferritin (Completed)  ? ?Other Visit Diagnoses   ? ? Acute nonintractable headache, unspecified headache type    -  Primary  ? Will check labs to look for cause. If normal, consider nortriptyline. Await results.   ? Other fatigue      ? Rechecking labs today. Await results. Treat as needed.   ? Relevant Orders  ? Urinalysis, Routine w reflex microscopic (Completed)  ? VITAMIN D 25 Hydroxy (Vit-D Deficiency, Fractures) (Completed)  ? Encounter for sterilization      ? Will start OCPs, discussed back up contraception for the first month. She is interested in permanent sterilization. Referral to OBGYN made today.  ?  Relevant Orders  ? Ambulatory referral to Obstetrics / Gynecology  ? ?  ?  ? ?Follow up plan: ?Return in about 4 weeks (around 12/05/2021) for with PCP. ? ? ? ? ? ?

## 2021-11-08 LAB — IRON AND TIBC
Iron Saturation: 15 % (ref 15–55)
Iron: 51 ug/dL (ref 27–159)
Total Iron Binding Capacity: 350 ug/dL (ref 250–450)
UIBC: 299 ug/dL (ref 131–425)

## 2021-11-08 LAB — CBC WITH DIFFERENTIAL/PLATELET
Basophils Absolute: 0 10*3/uL (ref 0.0–0.2)
Basos: 1 %
EOS (ABSOLUTE): 0.1 10*3/uL (ref 0.0–0.4)
Eos: 2 %
Hematocrit: 35.7 % (ref 34.0–46.6)
Hemoglobin: 11.6 g/dL (ref 11.1–15.9)
Immature Grans (Abs): 0 10*3/uL (ref 0.0–0.1)
Immature Granulocytes: 0 %
Lymphocytes Absolute: 2.2 10*3/uL (ref 0.7–3.1)
Lymphs: 35 %
MCH: 27 pg (ref 26.6–33.0)
MCHC: 32.5 g/dL (ref 31.5–35.7)
MCV: 83 fL (ref 79–97)
Monocytes Absolute: 0.3 10*3/uL (ref 0.1–0.9)
Monocytes: 5 %
Neutrophils Absolute: 3.6 10*3/uL (ref 1.4–7.0)
Neutrophils: 57 %
Platelets: 200 10*3/uL (ref 150–450)
RBC: 4.3 x10E6/uL (ref 3.77–5.28)
RDW: 15.5 % — ABNORMAL HIGH (ref 11.7–15.4)
WBC: 6.3 10*3/uL (ref 3.4–10.8)

## 2021-11-08 LAB — COMPREHENSIVE METABOLIC PANEL
ALT: 9 IU/L (ref 0–32)
AST: 16 IU/L (ref 0–40)
Albumin/Globulin Ratio: 1.9 (ref 1.2–2.2)
Albumin: 4.3 g/dL (ref 3.8–4.8)
Alkaline Phosphatase: 97 IU/L (ref 44–121)
BUN/Creatinine Ratio: 19 (ref 9–23)
BUN: 12 mg/dL (ref 6–20)
Bilirubin Total: <0.2 mg/dL (ref 0.0–1.2)
CO2: 23 mmol/L (ref 20–29)
Calcium: 8.8 mg/dL (ref 8.7–10.2)
Chloride: 106 mmol/L (ref 96–106)
Creatinine, Ser: 0.63 mg/dL (ref 0.57–1.00)
Globulin, Total: 2.3 g/dL (ref 1.5–4.5)
Glucose: 79 mg/dL (ref 70–99)
Potassium: 4.2 mmol/L (ref 3.5–5.2)
Sodium: 141 mmol/L (ref 134–144)
Total Protein: 6.6 g/dL (ref 6.0–8.5)
eGFR: 118 mL/min/{1.73_m2} (ref >59)

## 2021-11-08 LAB — VITAMIN D 25 HYDROXY (VIT D DEFICIENCY, FRACTURES): Vit D, 25-Hydroxy: 49 ng/mL (ref 30.0–100.0)

## 2021-11-08 LAB — TSH: TSH: 3.78 u[IU]/mL (ref 0.450–4.500)

## 2021-11-08 LAB — VITAMIN B12: Vitamin B-12: 643 pg/mL (ref 232–1245)

## 2021-11-08 LAB — FERRITIN: Ferritin: 10 ng/mL — ABNORMAL LOW (ref 15–150)

## 2021-11-10 ENCOUNTER — Encounter: Payer: Self-pay | Admitting: Family Medicine

## 2021-11-10 ENCOUNTER — Other Ambulatory Visit: Payer: Self-pay | Admitting: Family Medicine

## 2021-11-10 MED ORDER — NORTRIPTYLINE HCL 10 MG PO CAPS: 10.0000 mg | ORAL_CAPSULE | Freq: Every day | ORAL | 1 refills | Status: DC

## 2021-11-10 MED ORDER — NORETHIN ACE-ETH ESTRAD-FE 1-20 MG-MCG PO TABS: 1.0000 | ORAL_TABLET | Freq: Every day | ORAL | 11 refills | Status: DC

## 2021-11-10 NOTE — Assessment & Plan Note (Signed)
Rechecking labs today. Await results. Treat as needed.  °

## 2021-11-10 NOTE — Progress Notes (Signed)
Pt scheduled  

## 2021-11-25 ENCOUNTER — Telehealth: Payer: Self-pay | Admitting: Nurse Practitioner

## 2021-11-25 NOTE — Telephone Encounter (Unsigned)
Copied from CRM [id number]. Topic: General - Other ?>> Nov 25, 2021 12:26 PM Gaetana Michaelis A wrote: ?Reason for CRM: The patient has called to share that their nortriptyline (PAMELOR) 10 MG capsule [[id number]]  medication is not helping with their migraines ? ?The patient would like to be prescribed something else if possible ? ?Please contact further when possible ?

## 2021-11-25 NOTE — Telephone Encounter (Signed)
Routing to provider to advise.  

## 2021-11-26 ENCOUNTER — Other Ambulatory Visit: Payer: Self-pay | Admitting: Family Medicine

## 2021-11-26 MED ORDER — SUMATRIPTAN SUCCINATE 100 MG PO TABS: 100.0000 mg | ORAL_TABLET | ORAL | 0 refills | Status: DC | PRN

## 2021-11-26 NOTE — Telephone Encounter (Signed)
Called and spoke to patient. Last seen by Dr. Laural Benes in April. Patient wants to know if she could prescribe her something else for her migraine before she sees Clydie Braun. Thinks she did well with Imitrex in the past. Medication is listed in historical meds.  ?

## 2021-11-26 NOTE — Telephone Encounter (Signed)
I have not seen this patient before.  This requires an appointment. ?

## 2021-11-26 NOTE — Telephone Encounter (Signed)
Rx sent. Patient IS NOT CURRENTLY PREGNANT ?

## 2021-12-08 ENCOUNTER — Other Ambulatory Visit: Payer: Self-pay | Admitting: Family Medicine

## 2021-12-09 NOTE — Telephone Encounter (Signed)
Requested Prescriptions  ?Pending Prescriptions Disp Refills  ?? SUMAtriptan (IMITREX) 100 MG tablet [Pharmacy Med Name: SUMATRIPTAN SUCCINATE 100 MG TAB] 10 tablet 0  ?  Sig: TAKE 1 TABLET BY MOUTH AT ONSET OF HEADACHE. MAY TAKE AN ADDITIONAL TABLET IN 2 HOURS IF NEEDED. MAX OF 200mg /24hr  ?  ? Neurology:  Migraine Therapy - Triptan Passed - 12/08/2021 10:18 AM  ?  ?  Passed - Last BP in normal range  ?  BP Readings from Last 1 Encounters:  ?11/07/21 107/73  ?   ?  ?  Passed - Valid encounter within last 12 months  ?  Recent Outpatient Visits   ?      ? 1 month ago Acute nonintractable headache, unspecified headache type  ? [facility] [place], Megan P, DO  ? 2 years ago Screening-pulmonary TB  ? [facility] [place], [place] T, NP  ? 3 years ago Missed period  ? [facility] [place], [place], [place]  ? 4 years ago BV (bacterial vaginosis)  ? [facility] [place], [place], [place]  ? 4 years ago BV (bacterial vaginosis)  ? [facility] [place], [place], [place]  ?  ?  ?Future Appointments   ?        ? In 2 days [place], NP [facility], [facility]  ?  ? ?  ?  ?  ? ?

## 2021-12-10 ENCOUNTER — Other Ambulatory Visit: Payer: Self-pay | Admitting: Nurse Practitioner

## 2021-12-10 NOTE — Telephone Encounter (Signed)
Refilled 12/09/2021 #10 0 refills. ?Requested Prescriptions  ?Pending Prescriptions Disp Refills  ?? SUMAtriptan (IMITREX) 100 MG tablet [Pharmacy Med Name: SUMATRIPTAN SUCCINATE 100 MG TAB] 10 tablet 0  ?  Sig: TAKE 1 TABLET BY MOUTH AT ONSET OF HEADACHE. MAY TAKE AN ADDITIONAL TABLET IN 2 HOURS IF NEEDED. MAX OF 200mg /24hr  ?  ? Neurology:  Migraine Therapy - Triptan Passed - 12/10/2021 11:56 AM  ?  ?  Passed - Last BP in normal range  ?  BP Readings from Last 1 Encounters:  ?11/07/21 107/73  ?   ?  ?  Passed - Valid encounter within last 12 months  ?  Recent Outpatient Visits   ?      ? 1 month ago Acute nonintractable headache, unspecified headache type  ? [facility] [place], Megan P, DO  ? 2 years ago Screening-pulmonary TB  ? [facility] [place], [place] T, NP  ? 3 years ago Missed period  ? [facility] [place], [place], [place]  ? 4 years ago BV (bacterial vaginosis)  ? [facility] [place], [place], [place]  ? 4 years ago BV (bacterial vaginosis)  ? [facility] [place], [place], [place]  ?  ?  ?Future Appointments   ?        ? [place], NP [facility], [facility]  ?  ? ?  ?  ?  ? ?

## 2021-12-11 ENCOUNTER — Encounter: Payer: Self-pay | Admitting: Nurse Practitioner

## 2021-12-11 ENCOUNTER — Ambulatory Visit (INDEPENDENT_AMBULATORY_CARE_PROVIDER_SITE_OTHER): Payer: Medicaid Other | Admitting: Nurse Practitioner

## 2021-12-11 VITALS — BP 132/87 | HR 65 | Temp 98.3°F | Wt 184.6 lb

## 2021-12-11 DIAGNOSIS — F419 Anxiety disorder, unspecified: Secondary | ICD-10-CM | POA: Diagnosis not present

## 2021-12-11 MED ORDER — QUETIAPINE FUMARATE 25 MG PO TABS: 25.0000 mg | ORAL_TABLET | Freq: Every day | ORAL | 0 refills | Status: DC

## 2021-12-11 NOTE — Assessment & Plan Note (Signed)
Chronic. Not well controlled.  Will hold off on increasing Zoloft due to concern for worsening anxiety.  Will add Seroquel at bedtime to see if sleep and anxiety improve.  Follow up in 2 weeks for reevaluation.  Call sooner if concerns arise.

## 2021-12-11 NOTE — Progress Notes (Signed)
BP 132/87   Pulse 65   Temp 98.3 F (36.8 C) (Oral)   Wt 184 lb 9.6 oz (83.7 kg)   LMP 11/24/2020 (Approximate)   SpO2 99%   Breastfeeding No   BMI 29.80 kg/m    Subjective:    Patient ID: Janet Coleman, female    DOB: January 07, 1986, 36 y.o.   MRN: [id number]  HPI: Janet Coleman is a 36 y.o. female  Chief Complaint  Patient presents with   Migraine    HX of migraines. Pt states since her miscarriage she has had HA's since then.    Has been having headaches since December 2022. Having headaches on the top of her head, the whole head. Nothing makes them better. Noise makes them worse. Touch makes it worse. Headaches are constant. They wax and wane, but they do not go away. Radiate into her neck. No aura. No nausea or vomiting, no confusion.  Patient states they are getting unbearable.  Making it difficult yo function and do her everyday task.    Patient states the Imitrex has made them ease up but they are still there.  She was able to pick up her refill today.  She is only having to use one tab and then she goes to sleep.   She has not been sleeping well. She is worrying a lot at bedtime and not getting good rest. The nortriptyline did not help her sleep.  States she has been really struggling with anxiety.   [place] Office Visit from 11/07/2021 in [place]  PHQ-9 Total Score 8         11/07/2021    2:58 PM 01/22/2021    8:54 AM 12/09/2020    9:29 AM 11/21/2020   11:45 AM  GAD 7 : Generalized Anxiety Score  Nervous, Anxious, on Edge 1 0 1 3  Control/stop worrying 1 0 1 1  Worry too much - different things '1 1 1 1  ' Trouble relaxing 1 0 0 0  Restless 0 0 0 0  Easily annoyed or irritable 1 0 1 1  Afraid - awful might happen 0 0 0 0  Total GAD 7 Score '5 1 4 6  ' Anxiety Difficulty Somewhat difficult Not difficult at all Somewhat difficult      Relevant past medical, surgical, family and social history reviewed and updated as indicated. Interim  medical history since our last visit reviewed. Allergies and medications reviewed and updated.  Review of Systems  Constitutional: Negative.   Respiratory: Negative.    Cardiovascular: Negative.   Musculoskeletal: Negative.   Neurological: Negative.   Psychiatric/Behavioral:  Positive for dysphoric mood and sleep disturbance. Negative for suicidal ideas. The patient is nervous/anxious.    Per HPI unless specifically indicated above     Objective:    BP 132/87   Pulse 65   Temp 98.3 F (36.8 C) (Oral)   Wt 184 lb 9.6 oz (83.7 kg)   LMP 11/24/2020 (Approximate)   SpO2 99%   Breastfeeding No   BMI 29.80 kg/m   Wt Readings from Last 3 Encounters:  12/11/21 184 lb 9.6 oz (83.7 kg)  11/07/21 183 lb (83 kg)  07/15/21 171 lb 9.6 oz (77.8 kg)    Physical Exam Vitals and nursing note reviewed.  Constitutional:      General: She is not in acute distress.    Appearance: Normal appearance. She is not ill-appearing, toxic-appearing or diaphoretic.  HENT:     Head: Normocephalic  and atraumatic.     Right Ear: External ear normal.     Left Ear: External ear normal.     Nose: Nose normal.     Mouth/Throat:     Mouth: Mucous membranes are moist.     Pharynx: Oropharynx is clear.  Eyes:     General: No scleral icterus.       Right eye: No discharge.        Left eye: No discharge.     Extraocular Movements: Extraocular movements intact.     Conjunctiva/sclera: Conjunctivae normal.     Pupils: Pupils are equal, round, and reactive to light.  Cardiovascular:     Rate and Rhythm: Normal rate and regular rhythm.     Pulses: Normal pulses.     Heart sounds: Normal heart sounds. No murmur heard.   No friction rub. No gallop.  Pulmonary:     Effort: Pulmonary effort is normal. No respiratory distress.     Breath sounds: Normal breath sounds. No stridor. No wheezing, rhonchi or rales.  Chest:     Chest wall: No tenderness.  Musculoskeletal:        General: Normal range of motion.      Cervical back: Normal range of motion and neck supple.  Skin:    General: Skin is warm and dry.     Capillary Refill: Capillary refill takes less than 2 seconds.     Coloration: Skin is not jaundiced or pale.     Findings: No bruising, erythema, lesion or rash.  Neurological:     General: No focal deficit present.     Mental Status: She is alert and oriented to person, place, and time. Mental status is at baseline.  Psychiatric:        Mood and Affect: Mood normal.        Behavior: Behavior normal.        Thought Content: Thought content normal.        Judgment: Judgment normal.    Results for orders placed or performed in visit on 11/07/21  Comprehensive metabolic panel  Result Value Ref Range   Glucose 79 70 - 99 mg/dL   BUN 12 6 - 20 mg/dL   Creatinine, Ser 0.63 0.57 - 1.00 mg/dL   eGFR 118 >59 mL/min/1.73   BUN/Creatinine Ratio 19 9 - 23   Sodium 141 134 - 144 mmol/L   Potassium 4.2 3.5 - 5.2 mmol/L   Chloride 106 96 - 106 mmol/L   CO2 23 20 - 29 mmol/L   Calcium 8.8 8.7 - 10.2 mg/dL   Total Protein 6.6 6.0 - 8.5 g/dL   Albumin 4.3 3.8 - 4.8 g/dL   Globulin, Total 2.3 1.5 - 4.5 g/dL   Albumin/Globulin Ratio 1.9 1.2 - 2.2   Bilirubin Total <0.2 0.0 - 1.2 mg/dL   Alkaline Phosphatase 97 44 - 121 IU/L   AST 16 0 - 40 IU/L   ALT 9 0 - 32 IU/L  CBC with Differential/Platelet  Result Value Ref Range   WBC 6.3 3.4 - 10.8 x10E3/uL   RBC 4.30 3.77 - 5.28 x10E6/uL   Hemoglobin 11.6 11.1 - 15.9 g/dL   Hematocrit 35.7 34.0 - 46.6 %   MCV 83 79 - 97 fL   MCH 27.0 26.6 - 33.0 pg   MCHC 32.5 31.5 - 35.7 g/dL   RDW 15.5 (H) 11.7 - 15.4 %   Platelets 200 150 - 450 x10E3/uL   Neutrophils 57 Not Estab. %  Lymphs 35 Not Estab. %   Monocytes 5 Not Estab. %   Eos 2 Not Estab. %   Basos 1 Not Estab. %   Neutrophils Absolute 3.6 1.4 - 7.0 x10E3/uL   Lymphocytes Absolute 2.2 0.7 - 3.1 x10E3/uL   Monocytes Absolute 0.3 0.1 - 0.9 x10E3/uL   EOS (ABSOLUTE) 0.1 0.0 - 0.4 x10E3/uL    Basophils Absolute 0.0 0.0 - 0.2 x10E3/uL   Immature Granulocytes 0 Not Estab. %   Immature Grans (Abs) 0.0 0.0 - 0.1 x10E3/uL  Bayer DCA Hb A1c Waived  Result Value Ref Range   HB A1C (BAYER DCA - WAIVED) 4.9 4.8 - 5.6 %  TSH  Result Value Ref Range   TSH 3.780 0.450 - 4.500 uIU/mL  Urinalysis, Routine w reflex microscopic  Result Value Ref Range   Specific Gravity, UA 1.020 1.005 - 1.030   pH, UA 6.5 5.0 - 7.5   Color, UA Yellow Yellow   Appearance Ur Cloudy (A) Clear   Leukocytes,UA Negative Negative   Protein,UA Negative Negative/Trace   Glucose, UA Negative Negative   Ketones, UA Negative Negative   RBC, UA Negative Negative   Bilirubin, UA Negative Negative   Urobilinogen, Ur 0.2 0.2 - 1.0 mg/dL   Nitrite, UA Negative Negative  VITAMIN D 25 Hydroxy (Vit-D Deficiency, Fractures)  Result Value Ref Range   Vit D, 25-Hydroxy 49.0 30.0 - 100.0 ng/mL  B12  Result Value Ref Range   Vitamin B-12 643 232 - 1,245 pg/mL  Iron Binding Cap (TIBC)([facility])  Result Value Ref Range   Total Iron Binding Capacity 350 250 - 450 ug/dL   UIBC 299 131 - 425 ug/dL   Iron 51 27 - 159 ug/dL   Iron Saturation 15 15 - 55 %  Ferritin  Result Value Ref Range   Ferritin 10 (L) 15 - 150 ng/mL      Assessment & Plan:   Problem List Items Addressed This Visit       Other   Anxiety - Primary    Chronic. Not well controlled.  Will hold off on increasing Zoloft due to concern for worsening anxiety.  Will add Seroquel at bedtime to see if sleep and anxiety improve.  Follow up in 2 weeks for reevaluation.  Call sooner if concerns arise.          Follow up plan: Return in about 2 weeks (around 12/25/2021) for Depression/Anxiety FU.

## 2021-12-20 ENCOUNTER — Other Ambulatory Visit: Payer: Self-pay | Admitting: Nurse Practitioner

## 2021-12-23 ENCOUNTER — Other Ambulatory Visit: Payer: Self-pay | Admitting: Family Medicine

## 2021-12-23 NOTE — Telephone Encounter (Signed)
Requested Prescriptions  Pending Prescriptions Disp Refills  . SUMAtriptan (IMITREX) 100 MG tablet [Pharmacy Med Name: SUMATRIPTAN SUCCINATE 100 MG TAB] 10 tablet 0    Sig: TAKE 1 TABLET BY MOUTH AT ONSET OF HEADACHE. MAY TAKE AN ADDITIONAL TABLET IN 2 HOURS IF NEEDED. MAX OF 200mg /24hr     Neurology:  Migraine Therapy - Triptan Passed - 12/20/2021  9:12 AM      Passed - Last BP in normal range    BP Readings from Last 1 Encounters:  12/11/21 132/87         Passed - Valid encounter within last 12 months    Recent Outpatient Visits          1 week ago Anxiety   [facility], NP   1 month ago Acute nonintractable headache, unspecified headache type   [facility] [place], [place], DO   2 years ago Screening-pulmonary TB   [facility] [place], [place] T, NP   3 years ago Missed period   [facility], [place], [place]   4 years ago BV (bacterial vaginosis)   [facility], [facility], [place]      Future Appointments            In 2 days [place], NP [facility], [facility]

## 2021-12-24 NOTE — Progress Notes (Unsigned)
LMP 03/21/2021 (Exact Date)    Subjective:    Patient ID: Janet Coleman, female    DOB: Jun 08, 1986, 36 y.o.   MRN: [id number]  HPI: Janet Coleman is a 36 y.o. female  No chief complaint on file.  Has been having headaches since December 2022. Having headaches on the top of her head, the whole head. Nothing makes them better. Noise makes them worse. Touch makes it worse. Headaches are constant. They wax and wane, but they do not go away. Radiate into her neck. No aura. No nausea or vomiting, no confusion.  Patient states they are getting unbearable.  Making it difficult yo function and do her everyday task.    Patient states the Imitrex has made them ease up but they are still there.  She was able to pick up her refill today.  She is only having to use one tab and then she goes to sleep.   She has not been sleeping well. She is worrying a lot at bedtime and not getting good rest. The nortriptyline did not help her sleep.  States she has been really struggling with anxiety.   [facility] Visit from 11/07/2021 in [place]  PHQ-9 Total Score 8         11/07/2021    2:58 PM 01/22/2021    8:54 AM 12/09/2020    9:29 AM 11/21/2020   11:45 AM  GAD 7 : Generalized Anxiety Score  Nervous, Anxious, on Edge 1 0 1 3  Control/stop worrying 1 0 1 1  Worry too much - different things '1 1 1 1  ' Trouble relaxing 1 0 0 0  Restless 0 0 0 0  Easily annoyed or irritable 1 0 1 1  Afraid - awful might happen 0 0 0 0  Total GAD 7 Score '5 1 4 6  ' Anxiety Difficulty Somewhat difficult Not difficult at all Somewhat difficult      Relevant past medical, surgical, family and social history reviewed and updated as indicated. Interim medical history since our last visit reviewed. Allergies and medications reviewed and updated.  Review of Systems  Constitutional: Negative.   Respiratory: Negative.    Cardiovascular: Negative.   Musculoskeletal: Negative.   Neurological:  Negative.   Psychiatric/Behavioral:  Positive for dysphoric mood and sleep disturbance. Negative for suicidal ideas. The patient is nervous/anxious.    Per HPI unless specifically indicated above     Objective:    LMP 03/21/2021 (Exact Date)   Wt Readings from Last 3 Encounters:  12/11/21 184 lb 9.6 oz (83.7 kg)  11/07/21 183 lb (83 kg)  07/15/21 171 lb 9.6 oz (77.8 kg)    Physical Exam Vitals and nursing note reviewed.  Constitutional:      General: She is not in acute distress.    Appearance: Normal appearance. She is not ill-appearing, toxic-appearing or diaphoretic.  HENT:     Head: Normocephalic and atraumatic.     Right Ear: External ear normal.     Left Ear: External ear normal.     Nose: Nose normal.     Mouth/Throat:     Mouth: Mucous membranes are moist.     Pharynx: Oropharynx is clear.  Eyes:     General: No scleral icterus.       Right eye: No discharge.        Left eye: No discharge.     Extraocular Movements: Extraocular movements intact.     Conjunctiva/sclera: Conjunctivae normal.  Pupils: Pupils are equal, round, and reactive to light.  Cardiovascular:     Rate and Rhythm: Normal rate and regular rhythm.     Pulses: Normal pulses.     Heart sounds: Normal heart sounds. No murmur heard.   No friction rub. No gallop.  Pulmonary:     Effort: Pulmonary effort is normal. No respiratory distress.     Breath sounds: Normal breath sounds. No stridor. No wheezing, rhonchi or rales.  Chest:     Chest wall: No tenderness.  Musculoskeletal:        General: Normal range of motion.     Cervical back: Normal range of motion and neck supple.  Skin:    General: Skin is warm and dry.     Capillary Refill: Capillary refill takes less than 2 seconds.     Coloration: Skin is not jaundiced or pale.     Findings: No bruising, erythema, lesion or rash.  Neurological:     General: No focal deficit present.     Mental Status: She is alert and oriented to person,  place, and time. Mental status is at baseline.  Psychiatric:        Mood and Affect: Mood normal.        Behavior: Behavior normal.        Thought Content: Thought content normal.        Judgment: Judgment normal.    Results for orders placed or performed in visit on 11/07/21  Comprehensive metabolic panel  Result Value Ref Range   Glucose 79 70 - 99 mg/dL   BUN 12 6 - 20 mg/dL   Creatinine, Ser 0.63 0.57 - 1.00 mg/dL   eGFR 118 >59 mL/min/1.73   BUN/Creatinine Ratio 19 9 - 23   Sodium 141 134 - 144 mmol/L   Potassium 4.2 3.5 - 5.2 mmol/L   Chloride 106 96 - 106 mmol/L   CO2 23 20 - 29 mmol/L   Calcium 8.8 8.7 - 10.2 mg/dL   Total Protein 6.6 6.0 - 8.5 g/dL   Albumin 4.3 3.8 - 4.8 g/dL   Globulin, Total 2.3 1.5 - 4.5 g/dL   Albumin/Globulin Ratio 1.9 1.2 - 2.2   Bilirubin Total <0.2 0.0 - 1.2 mg/dL   Alkaline Phosphatase 97 44 - 121 IU/L   AST 16 0 - 40 IU/L   ALT 9 0 - 32 IU/L  CBC with Differential/Platelet  Result Value Ref Range   WBC 6.3 3.4 - 10.8 x10E3/uL   RBC 4.30 3.77 - 5.28 x10E6/uL   Hemoglobin 11.6 11.1 - 15.9 g/dL   Hematocrit 35.7 34.0 - 46.6 %   MCV 83 79 - 97 fL   MCH 27.0 26.6 - 33.0 pg   MCHC 32.5 31.5 - 35.7 g/dL   RDW 15.5 (H) 11.7 - 15.4 %   Platelets 200 150 - 450 x10E3/uL   Neutrophils 57 Not Estab. %   Lymphs 35 Not Estab. %   Monocytes 5 Not Estab. %   Eos 2 Not Estab. %   Basos 1 Not Estab. %   Neutrophils Absolute 3.6 1.4 - 7.0 x10E3/uL   Lymphocytes Absolute 2.2 0.7 - 3.1 x10E3/uL   Monocytes Absolute 0.3 0.1 - 0.9 x10E3/uL   EOS (ABSOLUTE) 0.1 0.0 - 0.4 x10E3/uL   Basophils Absolute 0.0 0.0 - 0.2 x10E3/uL   Immature Granulocytes 0 Not Estab. %   Immature Grans (Abs) 0.0 0.0 - 0.1 x10E3/uL  Bayer DCA Hb A1c Waived  Result Value Ref  Range   HB A1C (BAYER DCA - WAIVED) 4.9 4.8 - 5.6 %  TSH  Result Value Ref Range   TSH 3.780 0.450 - 4.500 uIU/mL  Urinalysis, Routine w reflex microscopic  Result Value Ref Range   Specific Gravity,  UA 1.020 1.005 - 1.030   pH, UA 6.5 5.0 - 7.5   Color, UA Yellow Yellow   Appearance Ur Cloudy (A) Clear   Leukocytes,UA Negative Negative   Protein,UA Negative Negative/Trace   Glucose, UA Negative Negative   Ketones, UA Negative Negative   RBC, UA Negative Negative   Bilirubin, UA Negative Negative   Urobilinogen, Ur 0.2 0.2 - 1.0 mg/dL   Nitrite, UA Negative Negative  VITAMIN D 25 Hydroxy (Vit-D Deficiency, Fractures)  Result Value Ref Range   Vit D, 25-Hydroxy 49.0 30.0 - 100.0 ng/mL  B12  Result Value Ref Range   Vitamin B-12 643 232 - 1,245 pg/mL  Iron Binding Cap (TIBC)([facility])  Result Value Ref Range   Total Iron Binding Capacity 350 250 - 450 ug/dL   UIBC 299 131 - 425 ug/dL   Iron 51 27 - 159 ug/dL   Iron Saturation 15 15 - 55 %  Ferritin  Result Value Ref Range   Ferritin 10 (L) 15 - 150 ng/mL      Assessment & Plan:   Problem List Items Addressed This Visit   None    Follow up plan: No follow-ups on file.

## 2021-12-25 ENCOUNTER — Encounter: Payer: Self-pay | Admitting: Nurse Practitioner

## 2021-12-25 ENCOUNTER — Telehealth (INDEPENDENT_AMBULATORY_CARE_PROVIDER_SITE_OTHER): Payer: Medicaid Other | Admitting: Nurse Practitioner

## 2021-12-25 DIAGNOSIS — R519 Headache, unspecified: Secondary | ICD-10-CM | POA: Diagnosis not present

## 2021-12-25 DIAGNOSIS — G8929 Other chronic pain: Secondary | ICD-10-CM

## 2021-12-25 MED ORDER — TOPIRAMATE 25 MG PO TABS: 25.0000 mg | ORAL_TABLET | Freq: Two times a day (BID) | ORAL | 0 refills | Status: DC

## 2021-12-25 NOTE — Assessment & Plan Note (Signed)
Chronic. Has been ongoing since December after she had a miscarriage and blood transfusion.  Imitrex helps but does not resolve.  Will start Topamax 25mg  daily. If tolerating well after two weeks can increase to twice daily.  Will order MRI of the brain for evaluation. Referral placed for patient to see Neurology.  Follow up in 1 month.  Call sooner if concerns arise.

## 2021-12-26 NOTE — Progress Notes (Signed)
Unable to leave vm to reach patient about scheduling follow up

## 2021-12-29 ENCOUNTER — Telehealth: Payer: Self-pay | Admitting: Nurse Practitioner

## 2021-12-29 ENCOUNTER — Other Ambulatory Visit: Payer: Self-pay | Admitting: Nurse Practitioner

## 2021-12-29 NOTE — Progress Notes (Signed)
2nd attempt to reach pt by phone.  

## 2021-12-29 NOTE — Telephone Encounter (Signed)
Copied from CRM [id number]. Topic: General - Other >> Dec 29, 2021  1:30 PM Randol Kern wrote: Reason for CRM: Pt is requesting a call back regarding her MRI, please advise  Best contact: [phone]

## 2021-12-29 NOTE — Telephone Encounter (Signed)
Janet Coleman - Patient states referral to Neuro was sent to [facility] and they do not accept her insurance there, patient asking for referral to be sent somewhere else. Patient also asked about MRI scheduling, states she has not heard yet where it will be done at, advised patient they will call her to schedule MRI.   Janet Coleman- patient states she was started on Topamax last week and had side effects from it. Patient was sick to her stomach and headaches felt worse. Patient stopped taking medication and put herself back on imitrex because it makes the pain more tolerable but only has 2 pills left. Patient would like to know if she can get a refill on Imitrex.

## 2021-12-30 MED ORDER — AMITRIPTYLINE HCL 10 MG PO TABS: 10.0000 mg | ORAL_TABLET | Freq: Every day | ORAL | 0 refills | Status: DC

## 2021-12-30 NOTE — Telephone Encounter (Signed)
I sent a prescription for amitriptyline to the pharmacy.  Side effects can be worsening depression or anxiety as well as dry mouth.  Have patient start it at bedtime.

## 2021-12-30 NOTE — Telephone Encounter (Signed)
Patient can not take the Imitrex daily.  This is meant to be an as needed medication.  It is too soon to refill the Imitrex.  I sent it for her last week.

## 2021-12-30 NOTE — Telephone Encounter (Signed)
Patient has been made aware of amytriptiline sent to medications along with side effects. Pt advised to take this at bedtime. Pt voiced understanding.

## 2021-12-30 NOTE — Telephone Encounter (Signed)
Patient verbalized understanding, is requesting another medication she can try since she could not tolerate the topamax.

## 2022-01-09 ENCOUNTER — Other Ambulatory Visit: Payer: Self-pay | Admitting: Nurse Practitioner

## 2022-01-09 MED ORDER — SUMATRIPTAN SUCCINATE 100 MG PO TABS: 100.0000 mg | ORAL_TABLET | ORAL | 0 refills | Status: DC | PRN

## 2022-01-12 ENCOUNTER — Ambulatory Visit
Admission: RE | Admit: 2022-01-12 | Discharge: 2022-01-12 | Disposition: A | Discharge status: Home / Self Care | Payer: Medicaid Other | Source: Ambulatory Visit | Attending: Nurse Practitioner | Admitting: Nurse Practitioner

## 2022-01-12 DIAGNOSIS — G8929 Other chronic pain: Secondary | ICD-10-CM | POA: Insufficient documentation

## 2022-01-12 DIAGNOSIS — R519 Headache, unspecified: Secondary | ICD-10-CM | POA: Diagnosis not present

## 2022-01-12 MED ORDER — GADOBUTROL 1 MMOL/ML IV SOLN
7.5000 mL | Freq: Once | INTRAVENOUS | Status: AC | PRN
  Administered 2022-01-12: 7.5 mL via INTRAVENOUS

## 2022-01-16 ENCOUNTER — Encounter: Payer: Self-pay | Admitting: Neurology

## 2022-01-16 ENCOUNTER — Encounter: Payer: Self-pay | Admitting: Nurse Practitioner

## 2022-01-16 DIAGNOSIS — R519 Headache, unspecified: Secondary | ICD-10-CM

## 2022-01-16 DIAGNOSIS — G43911 Migraine, unspecified, intractable, with status migrainosus: Secondary | ICD-10-CM | POA: Diagnosis not present

## 2022-01-21 ENCOUNTER — Ambulatory Visit (INDEPENDENT_AMBULATORY_CARE_PROVIDER_SITE_OTHER): Payer: Medicaid Other | Admitting: Neurology

## 2022-01-21 ENCOUNTER — Encounter: Payer: Self-pay | Admitting: Neurology

## 2022-01-21 VITALS — BP 126/79 | HR 81 | Ht 66.0 in | Wt 187.6 lb

## 2022-01-21 DIAGNOSIS — G43711 Chronic migraine without aura, intractable, with status migrainosus: Secondary | ICD-10-CM | POA: Insufficient documentation

## 2022-01-21 MED ORDER — RIZATRIPTAN BENZOATE 10 MG PO TBDP: 10.0000 mg | ORAL_TABLET | ORAL | 11 refills | Status: DC | PRN

## 2022-01-21 MED ORDER — ONDANSETRON 4 MG PO TBDP: 4.0000 mg | ORAL_TABLET | Freq: Three times a day (TID) | ORAL | 3 refills | Status: DC | PRN

## 2022-01-21 MED ORDER — AJOVY 225 MG/1.5ML ~~LOC~~ SOAJ: 225.0000 mg | SUBCUTANEOUS | 11 refills | Status: DC

## 2022-01-21 MED ORDER — NURTEC 75 MG PO TBDP: 75.0000 mg | ORAL_TABLET | Freq: Every day | ORAL | 0 refills | Status: DC

## 2022-01-21 NOTE — Patient Instructions (Addendum)
Stop Amitriptyline and Nortriptyline Continue Topiramate Prevention: Start Ajovy monthly. For the next week take Nurtec daily. At onset of migraines try Rizatriptan instead of Sumatriptan  Ondansetron for nausea  Fremanezumab Injection What is this medication? FREMANEZUMAB (fre ma NEZ ue mab) prevents migraines. It works by blocking a substance in the body that causes migraines. It is a monoclonal antibody. This medicine may be used for other purposes; ask your health care provider or pharmacist if you have questions. COMMON BRAND NAME(S): AJOVY What should I tell my care team before I take this medication? They need to know if you have any of these conditions: An unusual or allergic reaction to fremanezumab, other medications, foods, dyes, or preservatives Pregnant or trying to get pregnant Breast-feeding How should I use this medication? This medication is injected under the skin. You will be taught how to prepare and give it. Take it as directed on the prescription label. Keep taking it unless your care team tells you to stop. It is important that you put your used needles and syringes in a special sharps container. Do not put them in a trash can. If you do not have a sharps container, call your pharmacist or care team to get one. Talk to your care team about the use of this medication in children. Special care may be needed. Overdosage: If you think you have taken too much of this medicine contact a poison control center or emergency room at once. NOTE: This medicine is only for you. Do not share this medicine with others. What if I miss a dose? If you miss a dose, take it as soon as you can. If it is almost time for your next dose, take only that dose. Do not take double or extra doses. What may interact with this medication? Interactions are not expected. This list may not describe all possible interactions. Give your health care provider a list of all the medicines, herbs,  non-prescription drugs, or dietary supplements you use. Also tell them if you smoke, drink alcohol, or use illegal drugs. Some items may interact with your medicine. What should I watch for while using this medication? Tell your care team if your symptoms do not start to get better or if they get worse. What side effects may I notice from receiving this medication? Side effects that you should report to your care team as soon as possible: Allergic reactions or angioedema--skin rash, itching or hives, swelling of the face, eyes, lips, tongue, arms, or legs, trouble swallowing or breathing Side effects that usually do not require medical attention (report to your care team if they continue or are bothersome): Pain, redness, or irritation at injection site This list may not describe all possible side effects. Call your doctor for medical advice about side effects. You may report side effects to FDA at 1-800-FDA-1088. Where should I keep my medication? Keep out of the reach of children and pets. Store in a refrigerator or at room temperature between 20 and 25 degrees C (68 and 77 degrees F). Refrigeration (preferred): Store in the refrigerator. Do not freeze. Keep in the original container until you are ready to take it. Remove the dose from the carton about 30 minutes before it is time for you to use it. If the dose is not used, it may be stored in the original container at room temperature for 7 days. Get rid of any unused medication after the expiration date. Room Temperature: This medication may be stored at room temperature  for up to 7 days. Keep it in the original container. Protect from light until time of use. If it is stored at room temperature, get rid of any unused medication after 7 days or after it expires, whichever is first. To get rid of medications that are no longer needed or have expired: Take the medication to a medication take-back program. Check with your pharmacy or law enforcement  to find a location. If you cannot return the medication, ask your pharmacist or care team how to get rid of this medication safely. NOTE: This sheet is a summary. It may not cover all possible information. If you have questions about this medicine, talk to your doctor, pharmacist, or health care provider.  2023 Elsevier/Gold Standard (2021-09-05 00:00:00) Rimegepant Disintegrating Tablets What is this medication? RIMEGEPANT (ri ME je pant) prevents and treats migraines. It works by blocking a substance in the body that causes migraines. This medicine may be used for other purposes; ask your health care provider or pharmacist if you have questions. COMMON BRAND NAME(S): NURTEC ODT What should I tell my care team before I take this medication? They need to know if you have any of these conditions: Kidney disease Liver disease An unusual or allergic reaction to rimegepant, other medications, foods, dyes, or preservatives Pregnant or trying to get pregnant Breast-feeding How should I use this medication? Take this medication by mouth. Take it as directed on the prescription label. Leave the tablet in the sealed pack until you are ready to take it. With dry hands, open the pack and gently remove the tablet. If the tablet breaks or crumbles, throw it away. Use a new tablet. Place the tablet in the mouth and allow it to dissolve. Then, swallow it. Do not cut, crush, or chew this medication. You do not need water to take this medication. Talk to your care team about the use of this medication in children. Special care may be needed. Overdosage: If you think you have taken too much of this medicine contact a poison control center or emergency room at once. NOTE: This medicine is only for you. Do not share this medicine with others. What if I miss a dose? This does not apply. This medication is not for regular use. What may interact with this medication? Certain medications for fungal infections, such  as fluconazole, itraconazole Rifampin This list may not describe all possible interactions. Give your health care provider a list of all the medicines, herbs, non-prescription drugs, or dietary supplements you use. Also tell them if you smoke, drink alcohol, or use illegal drugs. Some items may interact with your medicine. What should I watch for while using this medication? Visit your care team for regular checks on your progress. Tell your care team if your symptoms do not start to get better or if they get worse. What side effects may I notice from receiving this medication? Side effects that you should report to your care team as soon as possible: Allergic reactions--skin rash, itching, hives, swelling of the face, lips, tongue, or throat Side effects that usually do not require medical attention (report to your care team if they continue or are bothersome): Nausea Stomach pain This list may not describe all possible side effects. Call your doctor for medical advice about side effects. You may report side effects to FDA at 1-800-FDA-1088. Where should I keep my medication? Keep out of the reach of children and pets. Store at room temperature between 20 and 25 degrees C (68  and 77 degrees F). Get rid of any unused medication after the expiration date. To get rid of medications that are no longer needed or have expired: Take the medication to a medication take-back program. Check with your pharmacy or law enforcement to find a location. If you cannot return the medication, check the label or package insert to see if the medication should be thrown out in the garbage or flushed down the toilet. If you are not sure, ask your care team. If it is safe to put it in the trash, take the medication out of the container. Mix the medication with cat litter, dirt, coffee grounds, or other unwanted substance. Seal the mixture in a bag or container. Put it in the trash. NOTE: This sheet is a summary. It may  not cover all possible information. If you have questions about this medicine, talk to your doctor, pharmacist, or health care provider.  2023 Elsevier/Gold Standard (2021-09-03 00:00:00) Rizatriptan Disintegrating Tablets What is this medication? RIZATRIPTAN (rye za TRIP tan) treats migraines. It works by blocking pain signals and narrowing blood vessels in the brain. It belongs to a group of medications called triptans. It is not used to prevent migraines. This medicine may be used for other purposes; ask your health care provider or pharmacist if you have questions. COMMON BRAND NAME(S): Maxalt-MLT What should I tell my care team before I take this medication? They need to know if you have any of these conditions: Cigarette smoker Circulation problems in fingers and toes Diabetes Heart disease High blood pressure High cholesterol History of irregular heartbeat History of stroke Kidney disease Liver disease Stomach or intestine problems An unusual or allergic reaction to rizatriptan, other medications, foods, dyes, or preservatives Pregnant or trying to get pregnant Breast-feeding How should I use this medication? Take this medication by mouth. Follow the directions on the prescription label. Leave the tablet in the sealed blister pack until you are ready to take it. With dry hands, open the blister and gently remove the tablet. If the tablet breaks or crumbles, throw it away and take a new tablet out of the blister pack. Place the tablet in the mouth and allow it to dissolve, and then swallow. Do not cut, crush, or chew this medication. You do not need water to take this medication. Do not take it more often than directed. Talk to your care team regarding the use of this medication in children. While this medication may be prescribed for children as young as 6 years for selected conditions, precautions do apply. Overdosage: If you think you have taken too much of this medicine contact a  poison control center or emergency room at once. NOTE: This medicine is only for you. Do not share this medicine with others. What if I miss a dose? This does not apply. This medication is not for regular use. What may interact with this medication? Do not take this medication with any of the following medications: Certain medications for migraine headache like almotriptan, eletriptan, frovatriptan, naratriptan, rizatriptan, sumatriptan, zolmitriptan Ergot alkaloids like dihydroergotamine, ergonovine, ergotamine, methylergonovine MAOIs like Carbex, Eldepryl, Marplan, Nardil, and Parnate This medication may also interact with the following medications: Certain medications for depression, anxiety, or psychotic disorders Propranolol This list may not describe all possible interactions. Give your health care provider a list of all the medicines, herbs, non-prescription drugs, or dietary supplements you use. Also tell them if you smoke, drink alcohol, or use illegal drugs. Some items may interact with your medicine.  What should I watch for while using this medication? Visit your care team for regular checks on your progress. Tell your care team if your symptoms do not start to get better or if they get worse. You may get drowsy or dizzy. Do not drive, use machinery, or do anything that needs mental alertness until you know how this medication affects you. Do not stand up or sit up quickly, especially if you are an older patient. This reduces the risk of dizzy or fainting spells. Alcohol may interfere with the effect of this medication. Your mouth may get dry. Chewing sugarless gum or sucking hard candy and drinking plenty of water may help. Contact your care team if the problem does not go away or is severe. If you take migraine medications for 10 or more days a month, your migraines may get worse. Keep a diary of headache days and medication use. Contact your care team if your migraine attacks occur  more frequently. What side effects may I notice from receiving this medication? Side effects that you should report to your care team as soon as possible: Allergic reactions--skin rash, itching, hives, swelling of the face, lips, tongue, or throat Burning, pain, tingling, or color changes in the legs or feet Heart attack--pain or tightness in the chest, shoulders, arms, or jaw, nausea, shortness of breath, cold or clammy skin, feeling faint or lightheaded Heart rhythm changes--fast or irregular heartbeat, dizziness, feeling faint or lightheaded, chest pain, trouble breathing Increase in blood pressure Irritability, confusion, fast or irregular heartbeat, muscle stiffness, twitching muscles, sweating, high fever, seizure, chills, vomiting, diarrhea, which may be signs of serotonin syndrome Raynaud's--cool, numb, or painful fingers or toes that may change color from pale, to blue, to red Seizures Stroke--sudden numbness or weakness of the face, arm, or leg, trouble speaking, confusion, trouble walking, loss of balance or coordination, dizziness, severe headache, change in vision Sudden or severe stomach pain, nausea, vomiting, fever, or bloody diarrhea Vision loss Side effects that usually do not require medical attention (report to your care team if they continue or are bothersome): Dizziness General discomfort or fatigue This list may not describe all possible side effects. Call your doctor for medical advice about side effects. You may report side effects to FDA at 1-800-FDA-1088. Where should I keep my medication? Keep out of the reach of children and pets. Store at room temperature between 15 and 30 degrees C (59 and 86 degrees F). Protect from light and moisture. Throw away any unused medication after the expiration date. NOTE: This sheet is a summary. It may not cover all possible information. If you have questions about this medicine, talk to your doctor, pharmacist, or health care  provider.  2023 Elsevier/Gold Standard (2020-08-21 00:00:00) Ondansetron Dissolving Tablets What is this medication? ONDANSETRON (on DAN se tron) prevents nausea and vomiting from chemotherapy, radiation, or surgery. It works by blocking substances in the body that may cause nausea or vomiting. It belongs to a group of medications called antiemetics. This medicine may be used for other purposes; ask your health care provider or pharmacist if you have questions. COMMON BRAND NAME(S): Zofran ODT What should I tell my care team before I take this medication? They need to know if you have any of these conditions: Heart disease History of irregular heartbeat Liver disease Low levels of magnesium or potassium in the blood An unusual or allergic reaction to ondansetron, granisetron, other medications, foods, dyes, or preservatives Pregnant or trying to get pregnant Breast-feeding How  should I use this medication? These tablets are made to dissolve in the mouth. Do not try to push the tablet through the foil backing. With dry hands, peel away the foil backing and gently remove the tablet. Place the tablet in the mouth and allow it to dissolve, then swallow. While you may take these tablets with water, it is not necessary to do so. Talk to your care team regarding the use of this medication in children. Special care may be needed. Overdosage: If you think you have taken too much of this medicine contact a poison control center or emergency room at once. NOTE: This medicine is only for you. Do not share this medicine with others. What if I miss a dose? If you miss a dose, take it as soon as you can. If it is almost time for your next dose, take only that dose. Do not take double or extra doses. What may interact with this medication? Do not take this medication with any of the following: Apomorphine Certain medications for fungal infections like fluconazole, itraconazole, ketoconazole, posaconazole,  voriconazole Cisapride Dronedarone Pimozide Thioridazine This medication may also interact with the following: Carbamazepine Certain medications for depression, anxiety, or psychotic disturbances Fentanyl Linezolid MAOIs like Carbex, Eldepryl, Marplan, Nardil, and Parnate Methylene blue (injected into a vein) Other medications that prolong the QT interval (cause an abnormal heart rhythm) like dofetilide, ziprasidone Phenytoin Rifampicin Tramadol This list may not describe all possible interactions. Give your health care provider a list of all the medicines, herbs, non-prescription drugs, or dietary supplements you use. Also tell them if you smoke, drink alcohol, or use illegal drugs. Some items may interact with your medicine. What should I watch for while using this medication? Check with your care team as soon as you can if you have any sign of an allergic reaction. What side effects may I notice from receiving this medication? Side effects that you should report to your care team as soon as possible: Allergic reactions--skin rash, itching, hives, swelling of the face, lips, tongue, or throat Bowel blockage--stomach cramping, unable to have a bowel movement or pass gas, loss of appetite, vomiting Chest pain (angina)--pain, pressure, or tightness in the chest, neck, back, or arms Heart rhythm changes--fast or irregular heartbeat, dizziness, feeling faint or lightheaded, chest pain, trouble breathing Irritability, confusion, fast or irregular heartbeat, muscle stiffness, twitching muscles, sweating, high fever, seizure, chills, vomiting, diarrhea, which may be signs of serotonin syndrome Side effects that usually do not require medical attention (report to your care team if they continue or are bothersome): Constipation Diarrhea General discomfort and fatigue Headache This list may not describe all possible side effects. Call your doctor for medical advice about side effects. You may  report side effects to FDA at 1-800-FDA-1088. Where should I keep my medication? Keep out of the reach of children and pets. Store between 2 and 30 degrees C (36 and 86 degrees F). Throw away any unused medication after the expiration date. NOTE: This sheet is a summary. It may not cover all possible information. If you have questions about this medicine, talk to your doctor, pharmacist, or health care provider.  2023 [employer]vier/Gold Standard (2020-08-16 00:00:00)

## 2022-01-21 NOTE — Progress Notes (Signed)
[facility]    Provider:  Dr Lucia Gaskins Requesting Provider: Larae Grooms, NP Primary Care Provider:  Larae Grooms, NP  CC:  migraines  HPI:  Janet Coleman is a 36 y.o. female here as requested by Larae Grooms, NP for migraines. Started after a miscarriage in Dec 2022. About 6 months have been worsening. Started getting migraines years ago but a sumatriptan helped. It is a lot of pressure, like a headband, her trigger spots are in the temples and on the top of her head, her scalp is very sensitive all over and her neck, stiffness in her neck. Muscular tension in the neck. Heat helps with the muscular pain. No family history if migraines. She has photophobia/phonophobia, nausea, pulsating/pounding, no vomiting but like she wants to. No vision changes. No hearing changes or swishing in her ears. Her blood pressure is fine. The headaches can be severe. Sumatriptan helps a little but not a lot. She went to urgent care and got a shot which helped. She has 4 kids and is not having anymore, using birth control. She has been to the eye doctor and her eyes are fine and her glasses were updated.   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 The right cerebellar tonsil extends 4-5 mm below the level of the foramen magnum. 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 However, no more than mild crowding is appreciated at the level of the foramen magnum.   No cortical encephalomalacia is identified. No significant cerebral white matter disease.   There is no acute infarct.   No evidence of an intracranial mass.   No chronic intracranial blood products.   No extra-axial fluid collection.   No midline shift.   Vascular: Maintained flow voids within the proximal large arterial vessels.   Skull and  upper cervical spine: No focal suspicious marrow lesion.   Sinuses/Orbits: No mass or acute finding within the imaged orbits. Minimal mucosal thickening within the bilateral ethmoid sinuses.   IMPRESSION: 1. No evidence of acute intracranial abnormality. 2. 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 The right cerebellar tonsil extends 4-5 mm below the level foramen magnum. 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 However, no more than mild crowding is appreciated at the level of the foramen magnum. 3. Otherwise unremarkable MRI appearance of the brain. 4. Minimal mucosal thickening within the bilateral ethmoid sinuses.  Review of Systems: Patient complains of symptoms per HPI as well as the following symptoms migraines. Pertinent negatives and positives per HPI. All others negative.   Social History   Socioeconomic History   Marital status: Single    Spouse name: Not on file   Number of children: Not on file   Years of education: Not on file   Highest education level: Not on file  Occupational History   Not on file  Tobacco Use   Smoking status: Some Days    Packs/day: 0.25    Years: 17.00    Total pack years: 4.25    Types: Cigarettes   Smokeless tobacco: Never   Tobacco comments:    Pt vapes everyday  Vaping Use   Vaping Use: Some days  Substance and Sexual Activity   Alcohol use: Not Currently    Comment: occass   Drug use: No   Sexual activity: Not Currently    Birth  control/protection: None  Other Topics Concern   Not on file  Social History Narrative   Not on file   Social Determinants of Health   Financial Resource Strain: Low Risk  (12/20/2018)   Overall Financial Resource Strain (CARDIA)    Difficulty of Paying Living Expenses: Not hard at all  Food Insecurity: No Food Insecurity (12/20/2018)   Hunger Vital Sign    Worried About Running Out of Food in the Last Year: Never true    Ran Out of Food in the Last  Year: Never true  Transportation Needs: No Transportation Needs (12/20/2018)   PRAPARE - Hydrologist (Medical): No    Lack of Transportation (Non-Medical): No  Physical Activity: Not on file  Stress: Not on file  Social Connections: Not on file  Intimate Partner Violence: Not At Risk (12/20/2018)   Humiliation, Afraid, Rape, and Kick questionnaire    Fear of Current or Ex-Partner: No    Emotionally Abused: No    Physically Abused: No    Sexually Abused: No    Family History  Problem Relation Age of Onset   Hypertension Mother    Autoimmune disease Mother    Stroke Father    Hypertension Father    Heart attack Father    Aneurysm Father        brain   Heart disease Father    Cancer Maternal Aunt        breast   Cancer Maternal Grandmother        breast   Diabetes Maternal Grandmother    Stroke Maternal Grandfather    Diabetes Paternal Grandmother    Heart disease Paternal Grandmother    Migraines Neg Hx     Past Medical History:  Diagnosis Date   GERD (gastroesophageal reflux disease)    Hypertension    Hypothyroidism    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   Migraines    Reactive hypoglycemia    Reactive hypoglycemia    Tobacco use    Vaginal Pap smear, abnormal     Patient Active Problem List   Diagnosis Date Noted   Intractable chronic migraine without aura and with status migrainosus 01/21/2022   Chronic nonintractable headache 12/25/2021   Anxiety 12/11/2021   Acute blood loss anemia 06/30/2021   Hypoglycemia 02/08/2015   GERD (gastroesophageal reflux disease)    Hypertension    Hypothyroidism    Tobacco use    Migraines     Past Surgical History:  Procedure Laterality Date   BELPHAROPTOSIS REPAIR     eye lid lift   DILATION AND CURETTAGE OF UTERUS     x 2   DILATION AND EVACUATION N/A 06/30/2021   Procedure: DILATATION AND EVACUATION;  Surgeon: Harlin Heys, MD;   Location: [facility];  Service: Gynecology;  Laterality: N/A;   LEEP     x 2. last paps 2011, 2012, 2013 were normal    Current Outpatient Medications  Medication Sig Dispense Refill   ferrous sulfate 220 (44 Fe) MG/5ML solution Take 220 mg by mouth daily.     Fremanezumab-vfrm (AJOVY) 225 MG/1.5ML SOAJ Inject 225 mg into the skin every 30 (thirty) days. 1.5 mL 11   norethindrone-ethinyl estradiol-FE (JUNEL FE 1/20) 1-20 MG-MCG tablet Take 1 tablet by mouth daily. 28 tablet 11   ondansetron (ZOFRAN-ODT) 4 MG disintegrating tablet Take 1-2 tablets (4-8 mg total) by mouth every 8 (eight) hours as needed. [place]  tablet 3   QUEtiapine (SEROQUEL) 25 MG tablet Take 1 tablet (25 mg total) by mouth at bedtime. 30 tablet 0   Rimegepant Sulfate (NURTEC) 75 MG TBDP Take 75 mg by mouth daily. For migraines. Take as close to onset of migraine as possible. One daily maximum. 8 tablet 0   rizatriptan (MAXALT-MLT) 10 MG disintegrating tablet Take 1 tablet (10 mg total) by mouth as needed for migraine. May repeat in 2 hours if needed 9 tablet 11   topiramate (TOPAMAX) 25 MG tablet Take 1 tablet (25 mg total) by mouth 2 (two) times daily. Take 1x daily for 2 weeks then increase to twice daily if tolerating well after two weeks. 60 tablet 0   No current facility-administered medications for this visit.    Allergies as of 01/21/2022 - Review Complete 01/21/2022  Allergen Reaction Noted   Toradol [ketorolac tromethamine] Swelling 02/08/2015   Ketorolac Swelling 05/07/2015   Tramadol Swelling 01/21/2022    Vitals: BP 126/79   Pulse 81   Ht 5\' 6"  (1.676 m)   Wt 187 lb 9.6 oz (85.1 kg)   LMP 12/22/2021 (Exact Date)   BMI 30.28 kg/m  Last Weight:  Wt Readings from Last 1 Encounters:  01/21/22 187 lb 9.6 oz (85.1 kg)   Last Height:   Ht Readings from Last 1 Encounters:  01/21/22 5\' 6"  (1.676 m)     Physical exam: Exam: Gen: NAD, conversant, well nourised, well groomed                     CV: RRR, no  MRG. No Carotid Bruits. No peripheral edema, warm, nontender Eyes: Conjunctivae clear without exudates or hemorrhage  Neuro: Detailed Neurologic Exam  Speech:    Speech is normal; fluent and spontaneous with normal comprehension.  Cognition:    The patient is oriented to person, place, and time;     recent and remote memory intact;     language fluent;     normal attention, concentration,     fund of knowledge Cranial Nerves:    The pupils are equal, round, and reactive to light. The fundi are normal and spontaneous venous pulsations are present. Visual fields are full to finger confrontation. Extraocular movements are intact. Trigeminal sensation is intact and the muscles of mastication are normal. The face is symmetric. The palate elevates in the midline. Hearing intact. Voice is normal. Shoulder shrug is normal. The tongue has normal motion without fasciculations.   Coordination:    Normal.   Gait:    normal.   Motor Observation:    No asymmetry, no atrophy, and no involuntary movements noted. Tone:    Normal muscle tone.    Posture:    Posture is normal. normal erect    Strength:    Strength is V/V in the upper and lower limbs.      Sensation: intact to LT     Reflex Exam:  DTR's:    Deep tendon reflexes in the upper and lower extremities are normal bilaterally.   Toes:    The toes are downgoing bilaterally.   Clonus:    Clonus is absent.    Assessment/Plan:  Patient with chronic migraines. MRI brain showed cerebellar ectopia unlikely contributory to headache.   Stop Amitriptyline and Nortriptyline Continue Topiramate Prevention: Start Ajovy monthly. For the next week take Nurtec daily to help bridge. At onset of migraines try Rizatriptan instead of Sumatriptan  Ondansetron for nausea  Orders Placed This Encounter  Procedures   Pregnancy, urine   Meds ordered this encounter  Medications   Fremanezumab-vfrm (AJOVY) 225 MG/1.5ML SOAJ    Sig: Inject 225  mg into the skin every 30 (thirty) days.    Dispense:  1.5 mL    Refill:  11   Rimegepant Sulfate (NURTEC) 75 MG TBDP    Sig: Take 75 mg by mouth daily. For migraines. Take as close to onset of migraine as possible. One daily maximum.    Dispense:  8 tablet    Refill:  0   rizatriptan (MAXALT-MLT) 10 MG disintegrating tablet    Sig: Take 1 tablet (10 mg total) by mouth as needed for migraine. May repeat in 2 hours if needed    Dispense:  9 tablet    Refill:  11   ondansetron (ZOFRAN-ODT) 4 MG disintegrating tablet    Sig: Take 1-2 tablets (4-8 mg total) by mouth every 8 (eight) hours as needed.    Dispense:  30 tablet    Refill:  3    Cc: Jon Billings, NP,  Jon Billings, NP  Sarina Ill, MD  [facility] [address], [place] [address]  Phone [phone] Fax [phone]  I spent 60 minutes of face-to-face and non-face-to-face time with patient on the  1. Intractable chronic migraine without aura and with status migrainosus    diagnosis.  This included previsit chart review, lab review, study review, order entry, electronic health record documentation, patient education on the different diagnostic and therapeutic options, counseling and coordination of care, risks and benefits of management, compliance, or risk factor reduction

## 2022-01-22 LAB — PREGNANCY, URINE: Preg Test, Ur: NEGATIVE

## 2022-01-26 ENCOUNTER — Encounter: Payer: Self-pay | Admitting: Nurse Practitioner

## 2022-01-26 ENCOUNTER — Ambulatory Visit (INDEPENDENT_AMBULATORY_CARE_PROVIDER_SITE_OTHER): Payer: Medicaid Other | Admitting: Nurse Practitioner

## 2022-01-26 ENCOUNTER — Telehealth: Payer: Self-pay | Admitting: *Deleted

## 2022-01-26 VITALS — BP 124/82 | HR 76 | Temp 98.4°F | Wt 186.0 lb

## 2022-01-26 DIAGNOSIS — F419 Anxiety disorder, unspecified: Secondary | ICD-10-CM | POA: Diagnosis not present

## 2022-01-26 DIAGNOSIS — G43711 Chronic migraine without aura, intractable, with status migrainosus: Secondary | ICD-10-CM

## 2022-01-26 MED ORDER — QUETIAPINE FUMARATE 50 MG PO TABS
50.0000 mg | ORAL_TABLET | Freq: Every day | ORAL | 0 refills | Status: DC
Start: 2022-01-26 — End: 2022-10-01

## 2022-01-26 NOTE — Telephone Encounter (Signed)
Lamara Dial Key: [id number] - PA Case ID: [id number]     PA Ajovy complete waiting on approval

## 2022-01-26 NOTE — Assessment & Plan Note (Signed)
Chronic. Not well controlled.  Will increase dose of Seroquel from 25mg  to 50mg  nightly.  Follow up if symptoms worsen or fail to improve. Follow up in 3 months.  Call sooner if concerns arise.

## 2022-01-26 NOTE — Telephone Encounter (Signed)
Plan in 01/21/22 office note:  Stop Amitriptyline and Nortriptyline Continue Topiramate Prevention: Start Ajovy monthly. For the next week take Nurtec daily. At onset of migraines try Rizatriptan instead of Sumatriptan  Ondansetron for nausea

## 2022-01-26 NOTE — Assessment & Plan Note (Signed)
Chronic. Not improved with first Ajovy injection.  Discussed that patient should not take Maxalt, Nurtec and Fioricet together.  Discussed that she should take Nurtec at the onset of migraine, if not improved in two hours can take Maxalt.  Discussed avoiding Fioricet due to rebound headaches.  Follow up with Neurology if symptoms do not improve.  Return to clinic in 3 months for reevaluation.

## 2022-01-26 NOTE — Progress Notes (Signed)
BP 124/82   Pulse 76   Temp 98.4 F (36.9 C) (Oral)   Wt 186 lb (84.4 kg)   LMP 03/21/2021 (Exact Date)   SpO2 98%   BMI 30.02 kg/m    Subjective:    Patient ID: Janet Coleman, female    DOB: 1986-02-02, 36 y.o.   MRN: [id number]  HPI: Janet Coleman is a 36 y.o. female  Chief Complaint  Patient presents with   Headache    Migraines follow up per patient, states she had an injection at neuro office was given sample packs but went to ED for more medication until the injections start working into her system. UC rx'd fioricet to take Q4H but she states she is now left with 4 pills and states she is "freaking out".     Patient states she saw neurology.  She was given an injection that hasn't worked yet.  Patient states she was given Nurtec but she she isn't sure if it is working.  Patient states she is still having migraines despite taking the Nurtec daily.  Patient states she was seen in ER and given Fioricet and she is taking the every 4 hours when she has a migraine and also taking the Imitrex.    DEPRESSION/ANXIETY Patient states she is still having a lot of anxiety.  States that she is sleeping well.  She would like to have something to calm her down.  Flowsheet Row Video Visit from 12/25/2021 in [facility]  PHQ-9 Total Score 12         12/25/2021   11:34 AM 11/07/2021    2:58 PM 01/22/2021    8:54 AM 12/09/2020    9:29 AM  GAD 7 : Generalized Anxiety Score  Nervous, Anxious, on Edge 2 1 0 1  Control/stop worrying 3 1 0 1  Worry too much - different things 3 1 1 1   Trouble relaxing 2 1 0 0  Restless 2 0 0 0  Easily annoyed or irritable 2 1 0 1  Afraid - awful might happen 3 0 0 0  Total GAD 7 Score 17 5 1 4   Anxiety Difficulty Somewhat difficult Somewhat difficult Not difficult at all Somewhat difficult     Relevant past medical, surgical, family and social history reviewed and updated as indicated. Interim medical history since our last visit  reviewed. Allergies and medications reviewed and updated.  Review of Systems  Constitutional: Negative.   Respiratory: Negative.    Cardiovascular: Negative.   Musculoskeletal: Negative.   Neurological:  Positive for headaches.  Psychiatric/Behavioral:  The patient is nervous/anxious.     Per HPI unless specifically indicated above     Objective:    BP 124/82   Pulse 76   Temp 98.4 F (36.9 C) (Oral)   Wt 186 lb (84.4 kg)   LMP 03/21/2021 (Exact Date)   SpO2 98%   BMI 30.02 kg/m   Wt Readings from Last 3 Encounters:  01/26/22 186 lb (84.4 kg)  01/21/22 187 lb 9.6 oz (85.1 kg)  12/11/21 184 lb 9.6 oz (83.7 kg)    Physical Exam Vitals and nursing note reviewed.  Constitutional:      General: She is not in acute distress.    Appearance: Normal appearance. She is normal weight. She is not ill-appearing, toxic-appearing or diaphoretic.  HENT:     Head: Normocephalic.     Right Ear: External ear normal.     Left Ear: External ear normal.  Nose: Nose normal.     Mouth/Throat:     Mouth: Mucous membranes are moist.     Pharynx: Oropharynx is clear.  Eyes:     General:        Right eye: No discharge.        Left eye: No discharge.     Extraocular Movements: Extraocular movements intact.     Conjunctiva/sclera: Conjunctivae normal.     Pupils: Pupils are equal, round, and reactive to light.  Cardiovascular:     Rate and Rhythm: Normal rate and regular rhythm.     Heart sounds: No murmur heard. Pulmonary:     Effort: Pulmonary effort is normal. No respiratory distress.     Breath sounds: Normal breath sounds. No wheezing or rales.  Musculoskeletal:     Cervical back: Normal range of motion and neck supple.  Skin:    General: Skin is warm and dry.     Capillary Refill: Capillary refill takes less than 2 seconds.  Neurological:     General: No focal deficit present.     Mental Status: She is alert and oriented to person, place, and time. Mental status is at  baseline.  Psychiatric:        Mood and Affect: Mood normal.        Behavior: Behavior normal.        Thought Content: Thought content normal.        Judgment: Judgment normal.     Results for orders placed or performed in visit on 01/21/22  Pregnancy, urine  Result Value Ref Range   Preg Test, Ur Negative Negative      Assessment & Plan:   Problem List Items Addressed This Visit       Cardiovascular and Mediastinum   Intractable chronic migraine without aura and with status migrainosus - Primary    Chronic. Not improved with first Ajovy injection.  Discussed that patient should not take Maxalt, Nurtec and Fioricet together.  Discussed that she should take Nurtec at the onset of migraine, if not improved in two hours can take Maxalt.  Discussed avoiding Fioricet due to rebound headaches.  Follow up with Neurology if symptoms do not improve.  Return to clinic in 3 months for reevaluation.        Other   Anxiety    Chronic. Not well controlled.  Will increase dose of Seroquel from 25mg  to 50mg  nightly.  Follow up if symptoms worsen or fail to improve. Follow up in 3 months.  Call sooner if concerns arise.         Follow up plan: Return in about 3 months (around 04/28/2022) for Migraines.

## 2022-01-28 ENCOUNTER — Ambulatory Visit: Payer: Medicaid Other | Admitting: Neurology

## 2022-01-28 ENCOUNTER — Telehealth: Payer: Self-pay | Admitting: Neurology

## 2022-01-28 NOTE — Telephone Encounter (Signed)
Ajovy denied  Insurance wants patient to try Emgality or Aimovig first  Will forward to Dr.Ahern to make her aware

## 2022-01-28 NOTE — Telephone Encounter (Signed)
Pt is asking for a call to discuss her rizatriptan (MAXALT-MLT) 10 MG disintegrating tablet, it says she is able to take 2-3 times a day but she was only prescribed 10 pills.  Please call pt to discuss further.

## 2022-01-28 NOTE — Telephone Encounter (Signed)
This is being discussed with patient via mychart.

## 2022-01-29 ENCOUNTER — Encounter: Payer: Self-pay | Admitting: *Deleted

## 2022-01-29 ENCOUNTER — Telehealth: Payer: Self-pay

## 2022-01-29 ENCOUNTER — Telehealth: Payer: Self-pay | Admitting: *Deleted

## 2022-01-29 ENCOUNTER — Other Ambulatory Visit: Payer: Self-pay | Admitting: Neurology

## 2022-01-29 DIAGNOSIS — G43711 Chronic migraine without aura, intractable, with status migrainosus: Secondary | ICD-10-CM

## 2022-01-29 MED ORDER — EMGALITY 120 MG/ML ~~LOC~~ SOAJ: 1.1200 mL | SUBCUTANEOUS | 3 refills | Status: DC

## 2022-01-29 MED ORDER — METHYLPREDNISOLONE 4 MG PO TBPK: ORAL_TABLET | ORAL | 0 refills | Status: DC

## 2022-01-29 MED ORDER — EMGALITY 120 MG/ML ~~LOC~~ SOAJ: 120.0000 mg | SUBCUTANEOUS | 11 refills | Status: DC

## 2022-01-29 NOTE — Telephone Encounter (Signed)
PA has been initiated for Seroquel through covermymeds. Awaiting determination,   Key: [id number]

## 2022-01-29 NOTE — Telephone Encounter (Signed)
From Dr Lucia Gaskins: Why don't we send her in a medrol dosepak? Call and discuss with her thanks   I spoke with the patient. She has not tried a steroid before. She is unaware of any steroid allergies. Her only known allergy is Tramadol. She is willing to try the steroid dosepak. Discussed possible side effects may be irritability, GI upset. Advised to take with food to avoid GI upset. Pt verbalized understanding and we discussed the dosepak will come with instructions and she will taper over the course of 6 days. Pt verbalized appreciation for the call. Medrol dosepak sent to pharmacy per v.o. Dr Lucia Gaskins. Pt also aware of the change from Ajovy to [employer] Her questions were answered. She took the first Ajovy on 01/21/22. She will continue with Emgality on 02/20/22.

## 2022-01-29 NOTE — Addendum Note (Signed)
Addended by: Ann Maki on: 01/29/2022 03:33 PM   Modules accepted: Orders

## 2022-01-29 NOTE — Telephone Encounter (Signed)
Emgality PA, Key: [id number], [id number].  Your information has been sent to [employer].

## 2022-01-29 NOTE — Addendum Note (Signed)
Addended by: Bertram Savin on: 01/29/2022 03:46 PM   Modules accepted: Orders

## 2022-01-30 NOTE — Telephone Encounter (Signed)
Emgality Approved through 05/01/2022. Faxed approval letter to pharmacy.

## 2022-02-25 ENCOUNTER — Telehealth: Payer: Self-pay | Admitting: Obstetrics and Gynecology

## 2022-02-25 ENCOUNTER — Other Ambulatory Visit: Payer: Self-pay | Admitting: Obstetrics and Gynecology

## 2022-02-25 NOTE — Telephone Encounter (Signed)
Advising via MyChart

## 2022-02-25 NOTE — Telephone Encounter (Signed)
Patient called needing prior authorization on RX for Zoloft. Please advise.

## 2022-02-26 ENCOUNTER — Other Ambulatory Visit: Payer: Self-pay

## 2022-02-26 MED ORDER — SERTRALINE HCL 50 MG PO TABS: 50.0000 mg | ORAL_TABLET | Freq: Every day | ORAL | 1 refills | Status: DC

## 2022-04-07 NOTE — Progress Notes (Unsigned)
No chief complaint on file.   HISTORY OF PRESENT ILLNESS:  04/07/22 ALL:  Janet Coleman is a 36 y.o. female here today for follow up for migraines. She was seen in consult with Dr Lucia Gaskins 12/2021 and advised to stop amitriptyline and nortriptyline, continue topiramate and add Ajovy. Rizatriptan as not effective and Nurtec advised for abortive therapy. Ajovy was not covered by insurance and she was switched to [employer] Since,    HISTORY (copied from Dr Trevor Mace previous note)  HPI:  Janet Coleman is a 36 y.o. female here as requested by Larae Grooms, NP for migraines. Started after a miscarriage in Dec 2022. About 6 months have been worsening. Started getting migraines years ago but a sumatriptan helped. It is a lot of pressure, like a headband, her trigger spots are in the temples and on the top of her head, her scalp is very sensitive all over and her neck, stiffness in her neck. Muscular tension in the neck. Heat helps with the muscular pain. No family history if migraines. She has photophobia/phonophobia, nausea, pulsating/pounding, no vomiting but like she wants to. No vision changes. No hearing changes or swishing in her ears. Her blood pressure is fine. The headaches can be severe. Sumatriptan helps a little but not a lot. She went to urgent care and got a shot which helped. She has 4 kids and is not having anymore, using birth control. She has been to the eye doctor and her eyes are fine and her glasses were updated.   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 The right cerebellar tonsil extends 4-5 mm below the level of the foramen magnum. 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 However, no more than mild crowding is appreciated at the level of the foramen magnum.   No cortical encephalomalacia is  identified. No significant cerebral white matter disease.   There is no acute infarct.   No evidence of an intracranial mass.   No chronic intracranial blood products.   No extra-axial fluid collection.   No midline shift.   Vascular: Maintained flow voids within the proximal large arterial vessels.   Skull and upper cervical spine: No focal suspicious marrow lesion.   Sinuses/Orbits: No mass or acute finding within the imaged orbits. Minimal mucosal thickening within the bilateral ethmoid sinuses.   IMPRESSION: 1. No evidence of acute intracranial abnormality. 2. 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 The right cerebellar tonsil extends 4-5 mm below the level foramen magnum. 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 However, no more than mild crowding is appreciated at the level of the foramen magnum. 3. Otherwise unremarkable MRI appearance of the brain. 4. Minimal mucosal thickening within the bilateral ethmoid sinuses.   REVIEW OF SYSTEMS: Out of a complete 14 system review of symptoms, the patient complains only of the following symptoms, and all other reviewed systems are negative.   ALLERGIES: Allergies  Allergen Reactions   Tramadol Swelling     HOME MEDICATIONS: Outpatient Medications Prior to Visit  Medication Sig Dispense Refill   ferrous sulfate 220 (44 Fe) MG/5ML solution Take 220 mg by mouth daily.     Galcanezumab-gnlm (EMGALITY) 120 MG/ML SOAJ Inject 120 mg into the skin every 30 (thirty) days. 1.12 mL 11   Galcanezumab-gnlm (EMGALITY) 120 MG/ML SOAJ Inject 1.12 mLs into the  skin every 28 (twenty-eight) days. 1.12 mL 3   methylPREDNISolone (MEDROL DOSEPAK) 4 MG TBPK tablet Follow pack instructions. Take pills with food over 6 day course until pack is complete. 21 tablet 0   norethindrone-ethinyl estradiol-FE (JUNEL FE 1/20) 1-20 MG-MCG tablet Take 1 tablet by mouth daily. 28 tablet 11   ondansetron  (ZOFRAN-ODT) 4 MG disintegrating tablet Take 1-2 tablets (4-8 mg total) by mouth every 8 (eight) hours as needed. 30 tablet 3   QUEtiapine (SEROQUEL) 50 MG tablet Take 1 tablet (50 mg total) by mouth at bedtime. 60 tablet 0   Rimegepant Sulfate (NURTEC) 75 MG TBDP Take 75 mg by mouth daily. For migraines. Take as close to onset of migraine as possible. One daily maximum. 8 tablet 0   rizatriptan (MAXALT-MLT) 10 MG disintegrating tablet Take 1 tablet (10 mg total) by mouth as needed for migraine. May repeat in 2 hours if needed 9 tablet 11   sertraline (ZOLOFT) 100 MG tablet TAKE 1 TABLET BY MOUTH ONCE DAILY 90 tablet 0   sertraline (ZOLOFT) 50 MG tablet Take 1 tablet (50 mg total) by mouth daily. 90 tablet 1   No facility-administered medications prior to visit.     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   Migraines    Reactive hypoglycemia    Reactive hypoglycemia    Tobacco use    Vaginal Pap smear, abnormal      PAST SURGICAL HISTORY: Past Surgical History:  Procedure Laterality Date   BELPHAROPTOSIS REPAIR     eye lid lift   DILATION AND CURETTAGE OF UTERUS     x 2   DILATION AND EVACUATION N/A 06/30/2021   Procedure: DILATATION AND EVACUATION;  Surgeon: Linzie Collin, MD;  Location: [facility] OR[facility];  Service: Gynecology;  Laterality: N/A;   LEEP     x 2. last paps 2011, 2012, 2013 were normal     FAMILY HISTORY: Family History  Problem Relation Age of Onset   Hypertension Mother    Autoimmune disease Mother    Stroke Father    Hypertension Father    Heart attack Father    Aneurysm Father        brain   Heart disease Father    Cancer Maternal Aunt        breast   Cancer Maternal Grandmother        breast   Diabetes Maternal Grandmother    Stroke Maternal Grandfather    Diabetes  Paternal Grandmother    Heart disease Paternal Grandmother    Migraines Neg Hx      SOCIAL HISTORY: Social History   Socioeconomic History   Marital status: Single    Spouse name: Not on file   Number of children: Not on file   Years of education: Not on file   Highest education level: Not on file  Occupational History   Not on file  Tobacco Use   Smoking status: Some Days    Packs/day: 0.25    Years: 17.00    Total pack years: 4.25    Types: Cigarettes   Smokeless tobacco: Never   Tobacco comments:    Pt vapes everyday  Vaping Use   Vaping Use: Some days  Substance and Sexual Activity   Alcohol use: Not Currently  Comment: occass   Drug use: No   Sexual activity: Not Currently    Birth control/protection: None  Other Topics Concern   Not on file  Social History Narrative   Not on file   Social Determinants of Health   Financial Resource Strain: Low Risk  (12/20/2018)   Overall Financial Resource Strain (CARDIA)    Difficulty of Paying Living Expenses: Not hard at all  Food Insecurity: No Food Insecurity (12/20/2018)   Hunger Vital Sign    Worried About Running Out of Food in the Last Year: Never true    Ran Out of Food in the Last Year: Never true  Transportation Needs: No Transportation Needs (12/20/2018)   PRAPARE - Administrator, Civil Service (Medical): No    Lack of Transportation (Non-Medical): No  Physical Activity: Not on file  Stress: Not on file  Social Connections: Not on file  Intimate Partner Violence: Not At Risk (12/20/2018)   Humiliation, Afraid, Rape, and Kick questionnaire    Fear of Current or Ex-Partner: No    Emotionally Abused: No    Physically Abused: No    Sexually Abused: No     PHYSICAL EXAM  There were no vitals filed for this visit. There is no height or weight on file to calculate BMI.  Generalized: Well developed, in no acute distress  Cardiology: normal rate and rhythm, no murmur auscultated   Respiratory: clear to auscultation bilaterally    Neurological examination  Mentation: Alert oriented to time, place, history taking. Follows all commands speech and language fluent Cranial nerve II-XII: Pupils were equal round reactive to light. Extraocular movements were full, visual field were full on confrontational test. Facial sensation and strength were normal. Uvula tongue midline. Head turning and shoulder shrug  were normal and symmetric. Motor: The motor testing reveals 5 over 5 strength of all 4 extremities. Good symmetric motor tone is noted throughout.  Sensory: Sensory testing is intact to soft touch on all 4 extremities. No evidence of extinction is noted.  Coordination: Cerebellar testing reveals good finger-nose-finger and heel-to-shin bilaterally.  Gait and station: Gait is normal. Tandem gait is normal. Romberg is negative. No drift is seen.  Reflexes: Deep tendon reflexes are symmetric and normal bilaterally.    DIAGNOSTIC DATA (LABS, IMAGING, TESTING) - I reviewed patient records, labs, notes, testing and imaging myself where available.  Lab Results  Component Value Date   WBC 6.3 11/07/2021   HGB 11.6 11/07/2021   HCT 35.7 11/07/2021   MCV 83 11/07/2021   PLT 200 11/07/2021      Component Value Date/Time   NA 141 11/07/2021 1542   K 4.2 11/07/2021 1542   CL 106 11/07/2021 1542   CO2 23 11/07/2021 1542   GLUCOSE 79 11/07/2021 1542   GLUCOSE 102 (H) 06/30/2021 0744   GLUCOSE 96 02/20/2015 0748   BUN 12 11/07/2021 1542   CREATININE 0.63 11/07/2021 1542   CALCIUM 8.8 11/07/2021 1542   PROT 6.6 11/07/2021 1542   ALBUMIN 4.3 11/07/2021 1542   AST 16 11/07/2021 1542   ALT 9 11/07/2021 1542   ALKPHOS 97 11/07/2021 1542   BILITOT <0.2 11/07/2021 1542   GFRNONAA >60 06/30/2021 0744   GFRAA >60 01/17/2017 0840   Lab Results  Component Value Date   CHOL 182 05/06/2017   HDL 63 05/06/2017   LDLCALC 91 05/06/2017   TRIG 139 05/06/2017   CHOLHDL 2.9  05/06/2017   Lab Results  Component Value Date  HGBA1C 4.9 11/07/2021   Lab Results  Component Value Date   VITAMINB12 643 11/07/2021   Lab Results  Component Value Date   TSH 3.780 11/07/2021        No data to display               No data to display           ASSESSMENT AND PLAN  36 y.o. year old female  has a past medical history of GERD (gastroesophageal reflux disease), Hypertension, Hypothyroidism, Localized morphea, Migraines, Reactive hypoglycemia, Reactive hypoglycemia, Tobacco use, and Vaginal Pap smear, abnormal. here with    No diagnosis found.  Ainhoa A Zacarias ***.  Healthy lifestyle habits encouraged. *** will follow up with PCP as directed. *** will return to see me in ***, sooner if needed. *** verbalizes understanding and agreement with this plan.   No orders of the defined types were placed in this encounter.    No orders of the defined types were placed in this encounter.    Shawnie Dapper, MSN, FNP-C 04/07/2022, 4:27 PM  [facility] [address] [place], [place] [address] [phone]

## 2022-04-07 NOTE — Patient Instructions (Signed)
Below is our plan:  We will continue Emgality every 30 days and Nurtec as needed. Continue ondansetron for nausea but no ore than 1-2 times a week at most.   Please make sure you are staying well hydrated. I recommend 50-60 ounces daily. Well balanced diet and regular exercise encouraged. Consistent sleep schedule with 6-8 hours recommended.   Please continue follow up with care team as directed.   Follow up with me in 6 months   You may receive a survey regarding today's visit. I encourage you to leave honest feed back as I do use this information to improve patient care. Thank you for seeing me today!

## 2022-04-08 ENCOUNTER — Encounter: Payer: Self-pay | Admitting: Family Medicine

## 2022-04-08 ENCOUNTER — Ambulatory Visit: Payer: Medicaid Other | Admitting: Family Medicine

## 2022-04-08 VITALS — BP 131/85 | HR 75 | Ht 66.0 in | Wt 184.0 lb

## 2022-04-08 DIAGNOSIS — G43711 Chronic migraine without aura, intractable, with status migrainosus: Secondary | ICD-10-CM

## 2022-04-08 MED ORDER — NURTEC 75 MG PO TBDP
75.0000 mg | ORAL_TABLET | Freq: Every day | ORAL | 11 refills | Status: DC
Start: 2022-04-08 — End: 2022-05-25

## 2022-04-09 ENCOUNTER — Encounter: Payer: Self-pay | Admitting: Obstetrics

## 2022-04-09 ENCOUNTER — Other Ambulatory Visit (HOSPITAL_COMMUNITY)
Admission: RE | Admit: 2022-04-09 | Discharge: 2022-04-09 | Disposition: A | Discharge status: Home / Self Care | Payer: Medicaid Other | Source: Ambulatory Visit | Attending: Obstetrics | Admitting: Obstetrics

## 2022-04-09 ENCOUNTER — Ambulatory Visit (INDEPENDENT_AMBULATORY_CARE_PROVIDER_SITE_OTHER): Payer: Medicaid Other | Admitting: Obstetrics

## 2022-04-09 VITALS — BP 125/84 | HR 81 | Ht 66.0 in | Wt 186.0 lb

## 2022-04-09 DIAGNOSIS — Z01419 Encounter for gynecological examination (general) (routine) without abnormal findings: Secondary | ICD-10-CM

## 2022-04-09 DIAGNOSIS — Z124 Encounter for screening for malignant neoplasm of cervix: Secondary | ICD-10-CM | POA: Insufficient documentation

## 2022-04-09 DIAGNOSIS — Z1231 Encounter for screening mammogram for malignant neoplasm of breast: Secondary | ICD-10-CM

## 2022-04-09 DIAGNOSIS — Z113 Encounter for screening for infections with a predominantly sexual mode of transmission: Secondary | ICD-10-CM | POA: Diagnosis not present

## 2022-04-09 DIAGNOSIS — D8989 Other specified disorders involving the immune mechanism, not elsewhere classified: Secondary | ICD-10-CM | POA: Diagnosis not present

## 2022-04-09 NOTE — Progress Notes (Signed)
SUBJECTIVE  HPI  Janet Coleman is a 36 y.o.-year-old female who presents for an annual gynecological exam and Pap smear today. She has a history of abnormal Pap smears. She has severe migraines and is being followed by neurology. She is struggling with managing her anxiety and plans to see a psychiatrist for medication management. She would like a referral to rheumatology to help manage her autoimmune disorder. She denies pelvic pain, unusual vaginal bleeding or discharge, dyspareunia, and UTI symptoms.  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   Migraines    Reactive hypoglycemia    Reactive hypoglycemia    Tobacco use    Vaginal Pap smear, abnormal    Past Surgical History:  Procedure Laterality Date   BELPHAROPTOSIS REPAIR     eye lid lift   DILATION AND CURETTAGE OF UTERUS     x 2   DILATION AND EVACUATION N/A 06/30/2021   Procedure: DILATATION AND EVACUATION;  Surgeon: Linzie Collin, MD;  Location: [facility] OR[facility];  Service: Gynecology;  Laterality: N/A;   LEEP     x 2. last paps 2011, 2012, 2013 were normal    Social History Lives with 4 children (ages 55, 34 3, and 1) Work: Geophysicist/field seismologist, coordinating schedules Exercise: walking Substances: smokes 2-3 cigarettes per day (down from 2 ppd!),occasional vape, occasional EtOH, denies recreational drugs  Obstetric History OB History     Gravida  7   Para  4   Term  4   Preterm      AB  3   Living  4      SAB  3   IAB      Ectopic      Multiple  0   Live Births  4            GYN/Menstrual History Patient's last menstrual period was 03/21/2022. regular periods every month Last Pap: 04/24/20, normal cytology, negative HPV. 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   norethindrone-ethinyl estradiol-FE (JUNEL FE 1/20) 1-20 MG-MCG tablet Take 1 tablet by mouth daily.   QUEtiapine (SEROQUEL) 50 MG tablet Take 1 tablet (50 mg total) by mouth at bedtime.   sertraline (ZOLOFT) 100 MG tablet TAKE 1 TABLET BY MOUTH ONCE DAILY   ondansetron (ZOFRAN-ODT) 4 MG disintegrating tablet Take 1-2 tablets (4-8 mg total) by mouth every 8 (eight) hours as needed. (Patient not taking: Reported on 04/09/2022)   Rimegepant Sulfate (NURTEC) 75 MG TBDP Take 75 mg by mouth daily. For migraines. Take as close to onset of migraine as possible. One daily maximum.   No facility-administered medications prior to visit.      Upstream - 04/09/22 1725       Pregnancy Intention Screening   Does the patient want to become pregnant in the next year? No    Does the patient's partner want to become pregnant in the next year? No    Would the patient like to discuss contraceptive options today? No      Contraception Wrap Up   Current Method Abstinence  End Method Abstinence    Contraception Counseling Provided No    How was the end contraceptive method provided? N/A            The pregnancy intention screening data noted above was reviewed. Potential methods of contraception were discussed. The patient elected to proceed with Abstinence.   ROS History obtained from the patient General ROS: negative for - chills, fatigue, fever, hot flashes, or night sweats Psychological ROS: positive for - anxiety Ophthalmic ROS: negative for - blurry vision or decreased vision ENT ROS: positive for - headaches negative for - nasal congestion or sore throat Hematological and Lymphatic ROS: negative Endocrine ROS: positive for - unexpected weight changes Breast ROS:  negative for breast lumps Respiratory ROS: no cough, shortness of breath, or wheezing Cardiovascular ROS: no chest pain or dyspnea on exertion but has chest pain when anxious  Gastrointestinal ROS: no abdominal pain, change in bowel habits, or black or bloody stools positive for - constipation Genito-Urinary ROS: no dysuria, trouble voiding, or hematuria Musculoskeletal ROS: positive for - wrist pain Dermatological ROS: positive for morphea on leg     12/25/2021   11:32 AM 11/07/2021    2:58 PM 05/29/2021   10:53 AM 01/22/2021    8:49 AM 12/09/2020    9:27 AM  Depression screen PHQ 2/9  Decreased Interest 2 1 0 1 1  Down, Depressed, Hopeless 3 1 0 1 1  PHQ - 2 Score 5 2 0 2 2  Altered sleeping 2 1  0 0  Tired, decreased energy 2 1  0 1  Change in appetite 2 1  0 1  Feeling bad or failure about yourself  0 1  0 0  Trouble concentrating 0 1  0 1  Moving slowly or fidgety/restless 1 1  0 0  Suicidal thoughts 0 0  0 0  PHQ-9 Score 12 8  2 5   Difficult doing work/chores Very difficult   Not difficult at all Somewhat difficult     OBJECTIVE  Last Weight  Most recent update: 04/09/2022  2:34 PM    Weight  84.4 kg (186 lb)             Body mass index is 30.02 kg/m.    BP 125/84   Pulse 81   Ht 5\' 6"  (1.676 m)   Wt 186 lb (84.4 kg)   LMP 03/21/2022   Breastfeeding No   BMI 30.02 kg/m  General appearance: alert, cooperative, and appears stated age Head: Normocephalic, without obvious abnormality, atraumatic Eyes: negative findings: lids and lashes normal and conjunctivae and sclerae normal Neck: no adenopathy, supple, symmetrical, trachea midline, and thyroid mildly enlarged, no nodules, nontender Back: symmetric, no curvature. ROM normal. No CVA tenderness. Lungs: clear to auscultation bilaterally Breasts: normal appearance, no masses or tenderness, Inspection negative, No nipple retraction or dimpling, No axillary or supraclavicular adenopathy, Normal to palpation without  dominant masses Heart: regular rate and rhythm, S1, S2 normal, no murmur, click, rub or gallop Abdomen: soft, non-tender; bowel sounds normal; no masses,  no organomegaly Pelvic: external genitalia normal, no cervical motion tenderness, rectovaginal septum normal, vagina normal without discharge, and cervix difficult to visualize, Pap collected Extremities: extremities normal, atraumatic, no cyanosis or edema and localized scleroderma on back of leg Pulses: 2+ and symmetric Skin: Skin color, texture, turgor normal. No rashes or lesions Lymph nodes: Cervical, supraclavicular, and axillary nodes normal.  ASSESSMENT  1) Annual exam 2) Due for Pap PLAN 1) Physical exam as  noted. STI swabs and lab work collected. Mammogram ordered. Referral to rheumatology sent. Labs: CBC, thyroid panel, A1C, lipid profile. 2) Pap collected. F/u based on results.  Return in one year for annual exam or as needed for concerns.   Guadlupe Spanish, CNM

## 2022-04-10 ENCOUNTER — Encounter: Payer: Self-pay | Admitting: Nurse Practitioner

## 2022-04-10 LAB — THYROID PANEL WITH TSH
Free Thyroxine Index: 1.4 (ref 1.2–4.9)
T3 Uptake Ratio: 23 % — ABNORMAL LOW (ref 24–39)
T4, Total: 6 ug/dL (ref 4.5–12.0)
TSH: 4.15 u[IU]/mL (ref 0.450–4.500)

## 2022-04-10 LAB — HEMOGLOBIN A1C
Est. average glucose Bld gHb Est-mCnc: 103 mg/dL
Hgb A1c MFr Bld: 5.2 % (ref 4.8–5.6)

## 2022-04-10 LAB — CBC WITH DIFFERENTIAL/PLATELET
Basophils Absolute: 0 10*3/uL (ref 0.0–0.2)
Basos: 0 %
EOS (ABSOLUTE): 0.2 10*3/uL (ref 0.0–0.4)
Eos: 2 %
Hematocrit: 41.6 % (ref 34.0–46.6)
Hemoglobin: 14.1 g/dL (ref 11.1–15.9)
Immature Grans (Abs): 0 10*3/uL (ref 0.0–0.1)
Immature Granulocytes: 0 %
Lymphocytes Absolute: 2.6 10*3/uL (ref 0.7–3.1)
Lymphs: 34 %
MCH: 30.6 pg (ref 26.6–33.0)
MCHC: 33.9 g/dL (ref 31.5–35.7)
MCV: 90 fL (ref 79–97)
Monocytes Absolute: 0.4 10*3/uL (ref 0.1–0.9)
Monocytes: 5 %
Neutrophils Absolute: 4.5 10*3/uL (ref 1.4–7.0)
Neutrophils: 59 %
Platelets: 240 10*3/uL (ref 150–450)
RBC: 4.61 x10E6/uL (ref 3.77–5.28)
RDW: 13.2 % (ref 11.7–15.4)
WBC: 7.7 10*3/uL (ref 3.4–10.8)

## 2022-04-10 LAB — RPR: RPR Ser Ql: NONREACTIVE

## 2022-04-10 LAB — LIPID PANEL
Chol/HDL Ratio: 2.8 ratio (ref 0.0–4.4)
Cholesterol, Total: 176 mg/dL (ref 100–199)
HDL: 62 mg/dL (ref >39)
LDL Chol Calc (NIH): 96 mg/dL (ref 0–99)
Triglycerides: 98 mg/dL (ref 0–149)
VLDL Cholesterol Cal: 18 mg/dL (ref 5–40)

## 2022-04-10 LAB — HIV ANTIBODY (ROUTINE TESTING W REFLEX): HIV Screen 4th Generation wRfx: NONREACTIVE

## 2022-04-10 LAB — HEPATITIS B SURFACE ANTIGEN: Hepatitis B Surface Ag: NEGATIVE

## 2022-04-10 LAB — HEPATITIS C ANTIBODY: Hep C Virus Ab: NONREACTIVE

## 2022-04-10 LAB — HEPATITIS B SURFACE ANTIBODY,QUALITATIVE: Hep B Surface Ab, Qual: REACTIVE

## 2022-04-13 LAB — CERVICOVAGINAL ANCILLARY ONLY
Bacterial Vaginitis (gardnerella): NEGATIVE
Candida Glabrata: NEGATIVE
Candida Vaginitis: NEGATIVE
Chlamydia: NEGATIVE
Comment: NEGATIVE
Comment: NEGATIVE
Comment: NEGATIVE
Comment: NEGATIVE
Comment: NEGATIVE
Comment: NORMAL
Neisseria Gonorrhea: NEGATIVE
Trichomonas: NEGATIVE

## 2022-04-15 LAB — CYTOLOGY - PAP
Comment: NEGATIVE
Diagnosis: NEGATIVE
High risk HPV: NEGATIVE

## 2022-04-29 ENCOUNTER — Telehealth (INDEPENDENT_AMBULATORY_CARE_PROVIDER_SITE_OTHER): Payer: Medicaid Other | Admitting: Nurse Practitioner

## 2022-04-29 ENCOUNTER — Encounter: Payer: Self-pay | Admitting: Nurse Practitioner

## 2022-04-29 ENCOUNTER — Ambulatory Visit: Payer: Medicaid Other | Admitting: Nurse Practitioner

## 2022-04-29 DIAGNOSIS — F419 Anxiety disorder, unspecified: Secondary | ICD-10-CM

## 2022-04-29 MED ORDER — BUSPIRONE HCL 5 MG PO TABS: 5.0000 mg | ORAL_TABLET | Freq: Two times a day (BID) | ORAL | 0 refills | Status: DC

## 2022-04-29 NOTE — Progress Notes (Signed)
LMP 04/19/2022   Breastfeeding No    Subjective:    Patient ID: Janet Coleman, female    DOB: Nov 25, 1985, 36 y.o.   MRN: [id number]  HPI: Janet Coleman is a 36 y.o. female  Chief Complaint  Patient presents with   Migraine    3 month follow up    Anxiety    Patient states her migraines are better.  She is getting Emgality injections which are helping her migraines.  She is still following up with Neurology.    DEPRESSION/ANXIETY Patient states she is still having a lot of anxiety.  She is sleeping better at night.  But when she wakes up in the morning she feels like she is emotional and anxiety is very elevated.  She feels like the Zoloft is helping with her depression.  But the anxiety has worsened after the migraines and seems to be getting worse.   Flowsheet Row Video Visit from 12/25/2021 in [place]  PHQ-9 Total Score 12         12/25/2021   11:34 AM 11/07/2021    2:58 PM 01/22/2021    8:54 AM 12/09/2020    9:29 AM  GAD 7 : Generalized Anxiety Score  Nervous, Anxious, on Edge 2 1 0 1  Control/stop worrying 3 1 0 1  Worry too much - different things 3 1 1 1   Trouble relaxing 2 1 0 0  Restless 2 0 0 0  Easily annoyed or irritable 2 1 0 1  Afraid - awful might happen 3 0 0 0  Total GAD 7 Score 17 5 1 4   Anxiety Difficulty Somewhat difficult Somewhat difficult Not difficult at all Somewhat difficult     Relevant past medical, surgical, family and social history reviewed and updated as indicated. Interim medical history since our last visit reviewed. Allergies and medications reviewed and updated.  Review of Systems  Constitutional: Negative.   Respiratory: Negative.    Cardiovascular: Negative.   Musculoskeletal: Negative.   Neurological:  Positive for headaches.  Psychiatric/Behavioral:  The patient is nervous/anxious.     Per HPI unless specifically indicated above     Objective:    LMP 04/19/2022   Breastfeeding No   Wt Readings  from Last 3 Encounters:  04/09/22 186 lb (84.4 kg)  04/08/22 184 lb (83.5 kg)  01/26/22 186 lb (84.4 kg)    Physical Exam Vitals and nursing note reviewed.  HENT:     Head: Normocephalic.     Right Ear: Hearing normal.     Left Ear: Hearing normal.     Nose: Nose normal.  Eyes:     Pupils: Pupils are equal, round, and reactive to light.  Pulmonary:     Effort: Pulmonary effort is normal. No respiratory distress.  Neurological:     Mental Status: She is alert.  Psychiatric:        Mood and Affect: Mood normal.        Behavior: Behavior normal.        Thought Content: Thought content normal.        Judgment: Judgment normal.     Results for orders placed or performed in visit on 04/09/22  RPR  Result Value Ref Range   RPR Ser Ql Non Reactive Non Reactive  HIV antibody (with reflex)  Result Value Ref Range   HIV Screen 4th Generation wRfx Non Reactive Non Reactive  Hepatitis C Antibody  Result Value Ref Range   Hep C  Virus Ab Non Reactive Non Reactive  Hepatitis B Surface AntiBODY  Result Value Ref Range   Hep B Surface Ab, Qual Reactive   Hepatitis B Surface AntiGEN  Result Value Ref Range   Hepatitis B Surface Ag Negative Negative  Thyroid Panel With TSH  Result Value Ref Range   TSH 4.150 0.450 - 4.500 uIU/mL   T4, Total 6.0 4.5 - 12.0 ug/dL   T3 Uptake Ratio 23 (L) 24 - 39 %   Free Thyroxine Index 1.4 1.2 - 4.9  Lipid Profile  Result Value Ref Range   Cholesterol, Total 176 100 - 199 mg/dL   Triglycerides 98 0 - 149 mg/dL   HDL 62 >95 mg/dL   VLDL Cholesterol Cal 18 5 - 40 mg/dL   LDL Chol Calc (NIH) 96 0 - 99 mg/dL   Chol/HDL Ratio 2.8 0.0 - 4.4 ratio  HgB A1c  Result Value Ref Range   Hgb A1c MFr Bld 5.2 4.8 - 5.6 %   Est. average glucose Bld gHb Est-mCnc 103 mg/dL  CBC w/Diff  Result Value Ref Range   WBC 7.7 3.4 - 10.8 x10E3/uL   RBC 4.61 3.77 - 5.28 x10E6/uL   Hemoglobin 14.1 11.1 - 15.9 g/dL   Hematocrit 18.8 41.6 - 46.6 %   MCV 90 79 - 97 fL    MCH 30.6 26.6 - 33.0 pg   MCHC 33.9 31.5 - 35.7 g/dL   RDW 60.6 30.1 - 60.1 %   Platelets 240 150 - 450 x10E3/uL   Neutrophils 59 Not Estab. %   Lymphs 34 Not Estab. %   Monocytes 5 Not Estab. %   Eos 2 Not Estab. %   Basos 0 Not Estab. %   Neutrophils Absolute 4.5 1.4 - 7.0 x10E3/uL   Lymphocytes Absolute 2.6 0.7 - 3.1 x10E3/uL   Monocytes Absolute 0.4 0.1 - 0.9 x10E3/uL   EOS (ABSOLUTE) 0.2 0.0 - 0.4 x10E3/uL   Basophils Absolute 0.0 0.0 - 0.2 x10E3/uL   Immature Granulocytes 0 Not Estab. %   Immature Grans (Abs) 0.0 0.0 - 0.1 x10E3/uL  Cervicovaginal ancillary only  Result Value Ref Range   Neisseria Gonorrhea Negative    Chlamydia Negative    Trichomonas Negative    Bacterial Vaginitis (gardnerella) Negative    Candida Vaginitis Negative    Candida Glabrata Negative    Comment Normal Reference Range Candida Species - Negative    Comment Normal Reference Range Candida Galbrata - Negative    Comment Normal Reference Range Trichomonas - Negative    Comment      Normal Reference Range Bacterial Vaginosis - Negative   Comment Normal Reference Ranger Chlamydia - Negative    Comment      Normal Reference Range Neisseria Gonorrhea - Negative  Cytology - PAP  Result Value Ref Range   High risk HPV Negative    Adequacy      Satisfactory for evaluation; transformation zone component PRESENT.   Diagnosis      - Negative for intraepithelial lesion or malignancy (NILM)   Comment Normal Reference Range HPV - Negative       Assessment & Plan:   Problem List Items Addressed This Visit       Other   Anxiety - Primary    Chronic.  Not well controlled.  Zoloft helps with her depression but not anxiety.  Sleeping well due to the Seroquel.  Will start Buspar 5mg  BID.  Side effects and benefits of medication discussed during visit.  Follow up in 1 month.  Call sooner if concerns arise.      Relevant Medications   busPIRone (BUSPAR) 5 MG tablet     Follow up plan: Return in  about 1 month (around 05/30/2022) for Depression/Anxiety FU.    This visit was completed via MyChart due to the restrictions of the COVID-19 pandemic. All issues as above were discussed and addressed. Physical exam was done as above through visual confirmation on MyChart. If it was felt that the patient should be evaluated in the office, they were directed there. The patient verbally consented to this visit. Location of the patient: Home Location of the provider: Office Those involved with this call:  Provider: Larae Grooms, NP CMA: Anitra Lauth, CMA Front Desk/Registration: Servando Snare This encounter was conducted via video.  I spent 30 dedicated to the care of this patient on the date of this encounter to include previsit review of medications, symptoms, plan of care, follow up, face to face time with the patient, and post visit ordering of testing.

## 2022-04-29 NOTE — Assessment & Plan Note (Signed)
Chronic.  Not well controlled.  Zoloft helps with her depression but not anxiety.  Sleeping well due to the Seroquel.  Will start Buspar 5mg  BID.  Side effects and benefits of medication discussed during visit. Follow up in 1 month.  Call sooner if concerns arise.

## 2022-04-30 NOTE — Progress Notes (Signed)
Pt scheduled  

## 2022-05-06 ENCOUNTER — Telehealth: Payer: Self-pay | Admitting: *Deleted

## 2022-05-06 NOTE — Telephone Encounter (Signed)
Emgality Approved through 05/07/2023.

## 2022-05-06 NOTE — Telephone Encounter (Signed)
Emgality PA, Key: [id number]. Your information has been sent to [employer].

## 2022-05-12 ENCOUNTER — Encounter: Payer: Self-pay | Admitting: Obstetrics

## 2022-05-21 ENCOUNTER — Other Ambulatory Visit: Payer: Self-pay | Admitting: Obstetrics and Gynecology

## 2022-05-25 ENCOUNTER — Telehealth: Payer: Self-pay | Admitting: Family Medicine

## 2022-05-25 MED ORDER — NURTEC 75 MG PO TBDP: 75.0000 mg | ORAL_TABLET | Freq: Every day | ORAL | 11 refills | Status: DC

## 2022-05-25 NOTE — Telephone Encounter (Signed)
Pt is calling. Stated medication (NURTEC) 75 MG TBDP  .   need a prior authorization.

## 2022-05-25 NOTE — Telephone Encounter (Signed)
PA approved: "Request Reference Number: [id number]. NURTEC TAB 75MG  ODT is approved through 05/26/2023. For further questions, call [employer] at [phone]."

## 2022-05-25 NOTE — Telephone Encounter (Signed)
Refill has been sent for the patient 

## 2022-05-25 NOTE — Telephone Encounter (Signed)
PA Nurtec submitted on CMM. Key: [id number]. Waiting on determination from [employer].

## 2022-05-25 NOTE — Telephone Encounter (Signed)
Pt is requesting a refill for Rimegepant Sulfate (NURTEC) 75 MG TBDP  .  Pharmacy: [facility]

## 2022-06-01 ENCOUNTER — Encounter: Payer: Self-pay | Admitting: Nurse Practitioner

## 2022-06-01 ENCOUNTER — Telehealth (INDEPENDENT_AMBULATORY_CARE_PROVIDER_SITE_OTHER): Payer: Medicaid Other | Admitting: Nurse Practitioner

## 2022-06-01 DIAGNOSIS — F419 Anxiety disorder, unspecified: Secondary | ICD-10-CM

## 2022-06-01 DIAGNOSIS — F339 Major depressive disorder, recurrent, unspecified: Secondary | ICD-10-CM | POA: Diagnosis not present

## 2022-06-01 MED ORDER — SERTRALINE HCL 50 MG PO TABS: 50.0000 mg | ORAL_TABLET | Freq: Every day | ORAL | 0 refills | Status: DC

## 2022-06-01 MED ORDER — BUSPIRONE HCL 5 MG PO TABS: 5.0000 mg | ORAL_TABLET | Freq: Two times a day (BID) | ORAL | 0 refills | Status: DC

## 2022-06-01 MED ORDER — SERTRALINE HCL 100 MG PO TABS: 100.0000 mg | ORAL_TABLET | Freq: Every day | ORAL | 0 refills | Status: DC

## 2022-06-01 NOTE — Progress Notes (Signed)
Breastfeeding No    Subjective:    Patient ID: Janet Coleman, female    DOB: [birth year], 36 y.o.   MRN: [id number]  HPI: Janet Coleman is a 36 y.o. female  Chief Complaint  Patient presents with   Anxiety   Depression    1 month follow up     DEPRESSION/ANXIETY Patient states her anxiety has been better.  Feels like the buspar has been helping her.  She is sleeping better than she was but not getting 8 hours of sleep.  She is getting 5-6 hours of sleep per night.  she is still having a lot of anxiety.  She is sleeping better at night.  She would like to go up on the zoloft dose.  Denies SI.  Flowsheet Row Video Visit from 12/25/2021 in [place]  PHQ-9 Total Score 12         12/25/2021   11:34 AM 11/07/2021    2:58 PM 01/22/2021    8:54 AM 12/09/2020    9:29 AM  GAD 7 : Generalized Anxiety Score  Nervous, Anxious, on Edge 2 1 0 1  Control/stop worrying 3 1 0 1  Worry too much - different things 3 1 1 1   Trouble relaxing 2 1 0 0  Restless 2 0 0 0  Easily annoyed or irritable 2 1 0 1  Afraid - awful might happen 3 0 0 0  Total GAD 7 Score 17 5 1 4   Anxiety Difficulty Somewhat difficult Somewhat difficult Not difficult at all Somewhat difficult     Relevant past medical, surgical, family and social history reviewed and updated as indicated. Interim medical history since our last visit reviewed. Allergies and medications reviewed and updated.  Review of Systems  Constitutional: Negative.   Respiratory: Negative.    Cardiovascular: Negative.   Musculoskeletal: Negative.   Neurological:  Positive for headaches.  Psychiatric/Behavioral:  Positive for dysphoric mood. Negative for suicidal ideas. The patient is nervous/anxious.     Per HPI unless specifically indicated above     Objective:    Breastfeeding No   Wt Readings from Last 3 Encounters:  04/09/22 186 lb (84.4 kg)  04/08/22 184 lb (83.5 kg)  01/26/22 186 lb (84.4 kg)    Physical  Exam Vitals and nursing note reviewed.  HENT:     Head: Normocephalic.     Right Ear: Hearing normal.     Left Ear: Hearing normal.     Nose: Nose normal.  Eyes:     Pupils: Pupils are equal, round, and reactive to light.  Pulmonary:     Effort: Pulmonary effort is normal. No respiratory distress.  Neurological:     Mental Status: She is alert.  Psychiatric:        Mood and Affect: Mood normal.        Behavior: Behavior normal.        Thought Content: Thought content normal.        Judgment: Judgment normal.     Results for orders placed or performed in visit on 04/09/22  RPR  Result Value Ref Range   RPR Ser Ql Non Reactive Non Reactive  HIV antibody (with reflex)  Result Value Ref Range   HIV Screen 4th Generation wRfx Non Reactive Non Reactive  Hepatitis C Antibody  Result Value Ref Range   Hep C Virus Ab Non Reactive Non Reactive  Hepatitis B Surface AntiBODY  Result Value Ref Range   Hep B Surface  Ab, Qual Reactive   Hepatitis B Surface AntiGEN  Result Value Ref Range   Hepatitis B Surface Ag Negative Negative  Thyroid Panel With TSH  Result Value Ref Range   TSH 4.150 0.450 - 4.500 uIU/mL   T4, Total 6.0 4.5 - 12.0 ug/dL   T3 Uptake Ratio 23 (L) 24 - 39 %   Free Thyroxine Index 1.4 1.2 - 4.9  Lipid Profile  Result Value Ref Range   Cholesterol, Total 176 100 - 199 mg/dL   Triglycerides 98 0 - 149 mg/dL   HDL 62 >16 mg/dL   VLDL Cholesterol Cal 18 5 - 40 mg/dL   LDL Chol Calc (NIH) 96 0 - 99 mg/dL   Chol/HDL Ratio 2.8 0.0 - 4.4 ratio  HgB A1c  Result Value Ref Range   Hgb A1c MFr Bld 5.2 4.8 - 5.6 %   Est. average glucose Bld gHb Est-mCnc 103 mg/dL  CBC w/Diff  Result Value Ref Range   WBC 7.7 3.4 - 10.8 x10E3/uL   RBC 4.61 3.77 - 5.28 x10E6/uL   Hemoglobin 14.1 11.1 - 15.9 g/dL   Hematocrit 10.9 60.4 - 46.6 %   MCV 90 79 - 97 fL   MCH 30.6 26.6 - 33.0 pg   MCHC 33.9 31.5 - 35.7 g/dL   RDW 54.0 98.1 - 19.1 %   Platelets 240 150 - 450 x10E3/uL    Neutrophils 59 Not Estab. %   Lymphs 34 Not Estab. %   Monocytes 5 Not Estab. %   Eos 2 Not Estab. %   Basos 0 Not Estab. %   Neutrophils Absolute 4.5 1.4 - 7.0 x10E3/uL   Lymphocytes Absolute 2.6 0.7 - 3.1 x10E3/uL   Monocytes Absolute 0.4 0.1 - 0.9 x10E3/uL   EOS (ABSOLUTE) 0.2 0.0 - 0.4 x10E3/uL   Basophils Absolute 0.0 0.0 - 0.2 x10E3/uL   Immature Granulocytes 0 Not Estab. %   Immature Grans (Abs) 0.0 0.0 - 0.1 x10E3/uL  Cervicovaginal ancillary only  Result Value Ref Range   Neisseria Gonorrhea Negative    Chlamydia Negative    Trichomonas Negative    Bacterial Vaginitis (gardnerella) Negative    Candida Vaginitis Negative    Candida Glabrata Negative    Comment Normal Reference Range Candida Species - Negative    Comment Normal Reference Range Candida Galbrata - Negative    Comment Normal Reference Range Trichomonas - Negative    Comment      Normal Reference Range Bacterial Vaginosis - Negative   Comment Normal Reference Ranger Chlamydia - Negative    Comment      Normal Reference Range Neisseria Gonorrhea - Negative  Cytology - PAP  Result Value Ref Range   High risk HPV Negative    Adequacy      Satisfactory for evaluation; transformation zone component PRESENT.   Diagnosis      - Negative for intraepithelial lesion or malignancy (NILM)   Comment Normal Reference Range HPV - Negative       Assessment & Plan:   Problem List Items Addressed This Visit       Other   Anxiety    Chronic. Improved with Buspar 5mg  BID.  Refill sent today.  Follow up in 3 months.  Call sooner if concerns arise.       Relevant Medications   sertraline (ZOLOFT) 100 MG tablet   busPIRone (BUSPAR) 5 MG tablet   sertraline (ZOLOFT) 50 MG tablet   Depression, recurrent (HCC) - Primary  Chronic. Ongoing.  Zoloft increased from 100mg  to 150mg  daily.  Refills sent today.  Follow up in 3 months.  Call sooner if concerns arise.       Relevant Medications   sertraline (ZOLOFT) 100 MG  tablet   busPIRone (BUSPAR) 5 MG tablet   sertraline (ZOLOFT) 50 MG tablet     Follow up plan: Return in about 3 months (around 09/01/2022) for Physical and Fasting labs.    This visit was completed via MyChart due to the restrictions of the COVID-19 pandemic. All issues as above were discussed and addressed. Physical exam was done as above through visual confirmation on MyChart. If it was felt that the patient should be evaluated in the office, they were directed there. The patient verbally consented to this visit. Location of the patient: Home Location of the provider: Office Those involved with this call:  Provider: , NP CMA: 10/31/2022, CMA Front Desk/Registration: Larae Grooms This encounter was conducted via video.  I spent 30 dedicated to the care of this patient on the date of this encounter to include previsit review of medications, symptoms, plan of care, follow up, face to face time with the patient, and post visit ordering of testing.

## 2022-06-01 NOTE — Assessment & Plan Note (Signed)
Chronic. Improved with Buspar 5mg  BID.  Refill sent today.  Follow up in 3 months.  Call sooner if concerns arise.

## 2022-06-01 NOTE — Assessment & Plan Note (Signed)
Chronic. Ongoing.  Zoloft increased from 100mg  to 150mg  daily.  Refills sent today.  Follow up in 3 months.  Call sooner if concerns arise.

## 2022-06-02 NOTE — Progress Notes (Signed)
Pt scheduled  

## 2022-07-08 IMAGING — MR MR HEAD WO/W CM
14 series · 48 of 48 positions shown · IV contrast (gadavist)
Comparison: No pertinent prior exams available for comparison.

CLINICAL DATA: Provided history: Chronic non-intractable headache,
unspecified headache type. Headache, chronic, no new features.
Additional history provided by scanning technologist: Patient
reports headaches/migraines since miscarriage.

EXAM:
MRI HEAD WITHOUT AND WITH CONTRAST
TECHNIQUE: Multiplanar, multiecho pulse sequences of the brain and surrounding
structures were obtained without and with intravenous contrast.
CONTRAST:  7.5mL GADAVIST GADOBUTROL 1 MMOL/ML IV SOLN

[Series 5: ax dwi_tracew · axial · 3.0mm · 0.65mm/px · z∈[-81,+73]mm · 3 of 48 slices shown]
[im 1/48]
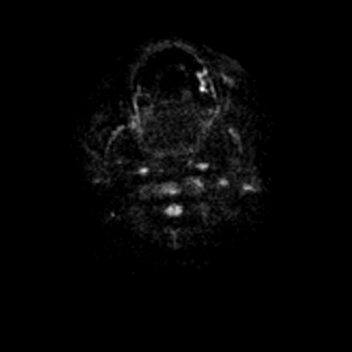
[im 24/48]
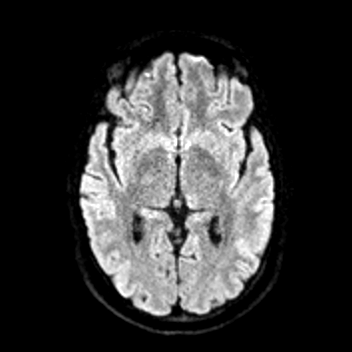
[im 48/48]
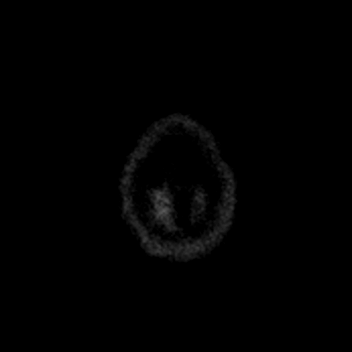

[Series 6: ax dwi_adc · axial · 3.0mm · 0.65mm/px · z∈[-81,+73]mm · 2 of 48 slices shown]
[im 1/48]
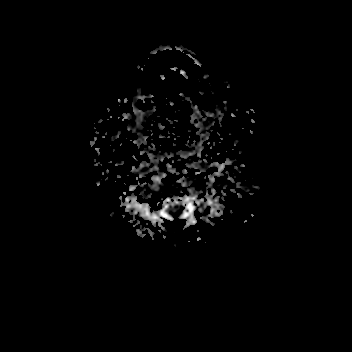
[im 48/48]
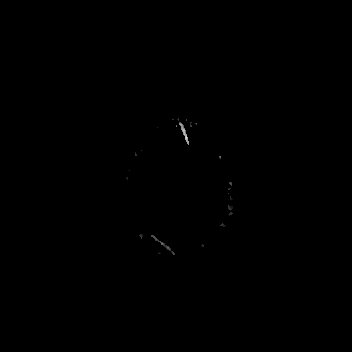

[Series 7: cor dwi_tracew · coronal · 5.0mm · 0.68mm/px · 2 of 40 slices shown]
[im 1/40]
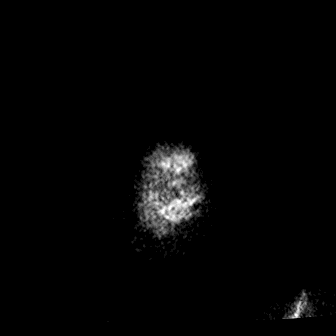
[im 40/40]
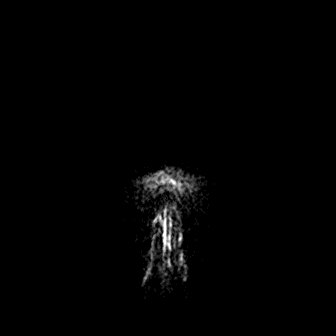

[Series 8: cor dwi_adc · coronal · 5.0mm · 0.68mm/px · 2 of 40 slices shown]
[im 1/40]
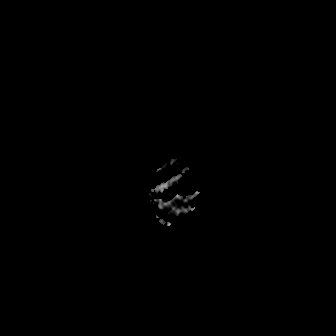
[im 40/40]
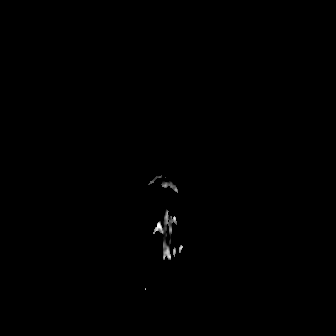

[Series 9: T1 · sagittal · 5.0mm · 0.62mm/px · 1 of 23 slices shown (1 of 2)]
[im 1/23]
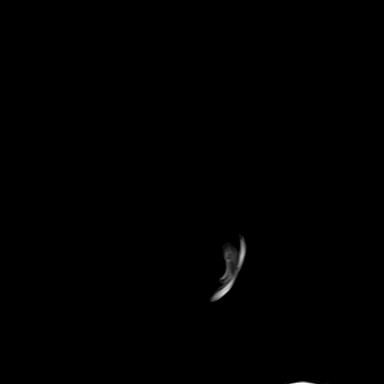

[Series 10: T2 · axial · 5.0mm · 0.53mm/px · 1 of 26 slices shown]
[im 1/26]
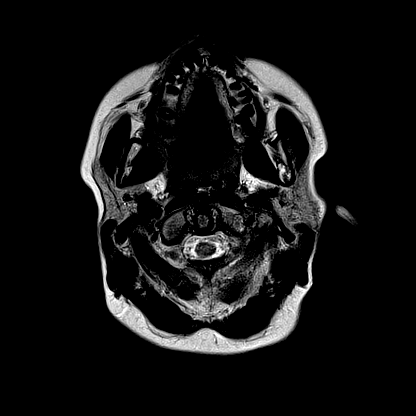

[Series 11: ax swi_mag · axial · 2.0mm · 0.90mm/px · z∈[-82,+75]mm · 4 of 80 slices shown]
[im 1/80]
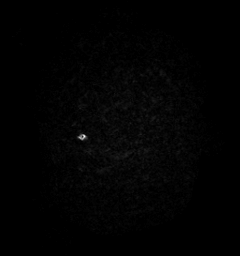
[im 27/80]
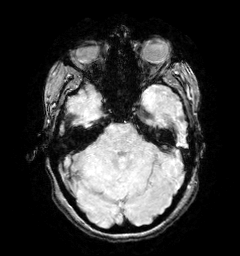
[im 53/80]
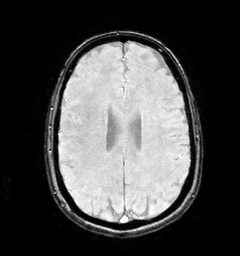
[im 80/80]
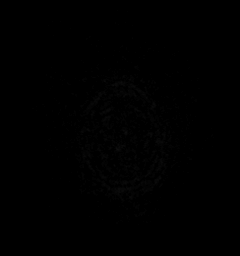

[Series 12: ax swi_pha · axial · 2.0mm · 0.90mm/px · z∈[-82,+75]mm · 4 of 80 slices shown]
[im 1/80]
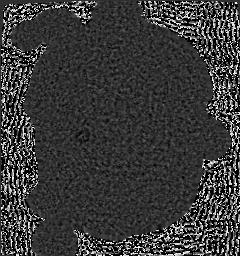
[im 27/80]
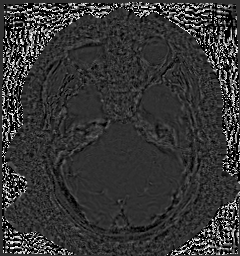
[im 53/80]
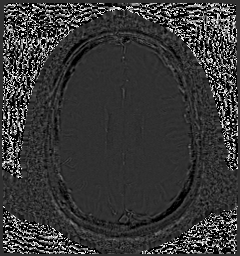
[im 80/80]
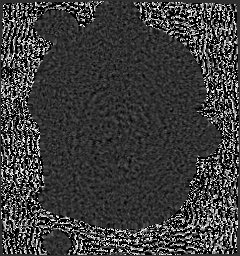

[Series 13: ax swi_swi · axial · 2.0mm · 0.90mm/px · z∈[-82,+75]mm · 4 of 80 slices shown]
[im 1/80]
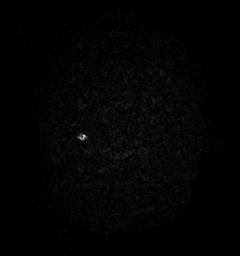
[im 27/80]
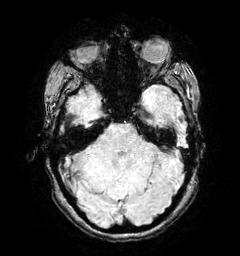
[im 53/80]
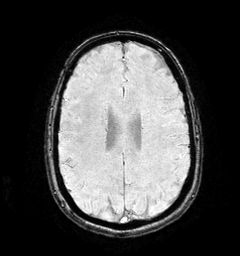
[im 80/80]
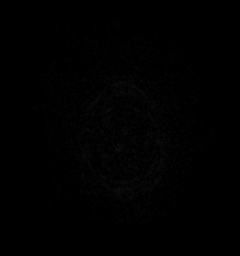

[Series 15: FLAIR · axial · 3.0mm · 0.53mm/px · z∈[-86,+74]mm · 3 of 55 slices shown]
[im 1/55]
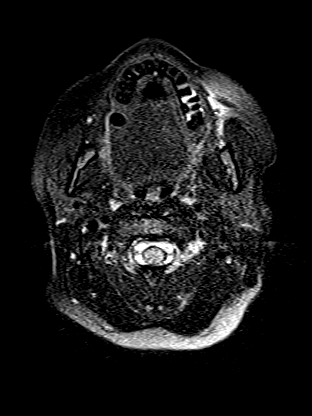
[im 28/55]
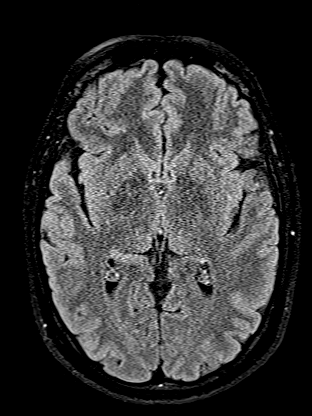
[im 55/55]
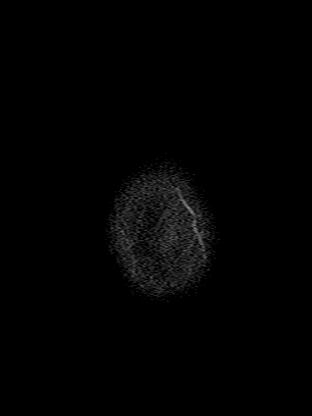

[Series 16: T1 · axial · 1.0mm · 0.98mm/px · z∈[-86,+85]mm · 9 of 173 slices shown (2 of 2)]
[im 1/173]
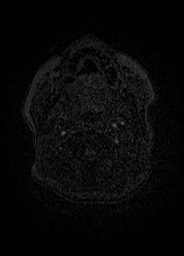
[im 22/173]
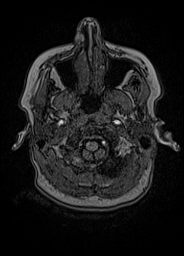
[im 44/173]
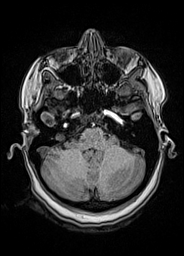
[im 65/173]
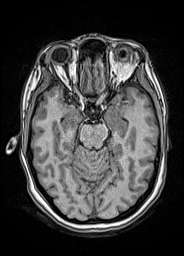
[im 87/173]
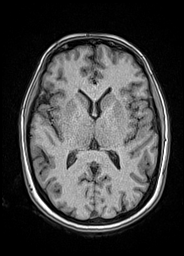
[im 108/173]
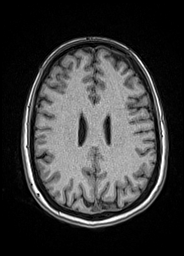
[im 130/173]
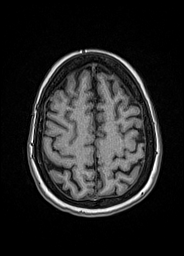
[im 151/173]
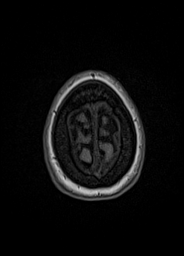
[im 173/173]
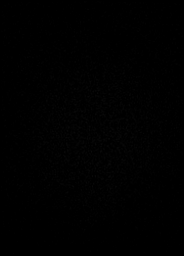

[Series 17: T2 post-contrast · coronal · 5.0mm · 0.57mm/px · 2 of 29 slices shown]
[im 1/29]
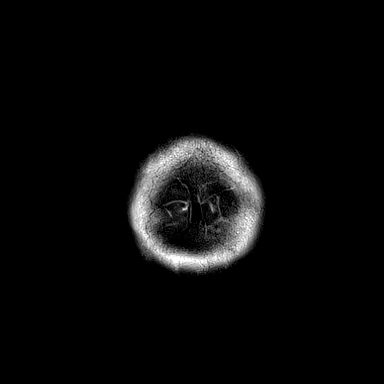
[im 29/29]
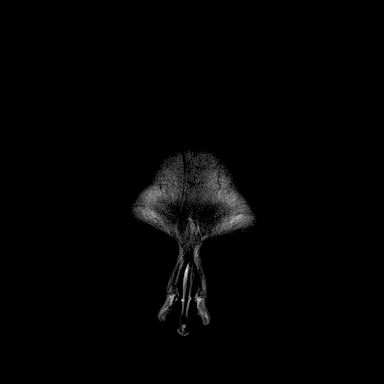

[Series 18: T1 post-contrast · axial · 1.0mm · 0.98mm/px · z∈[-86,+88]mm · 9 of 175 slices shown (1 of 2)]
[im 1/175]
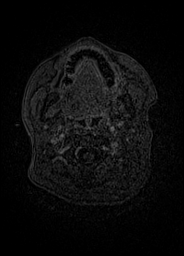
[im 22/175]
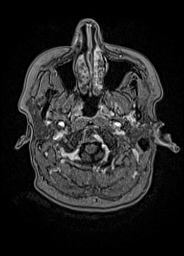
[im 44/175]
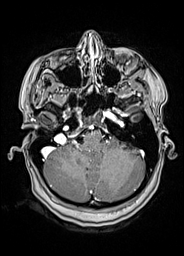
[im 66/175]
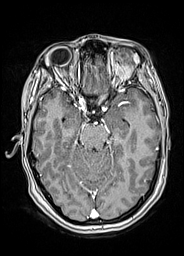
[im 88/175]
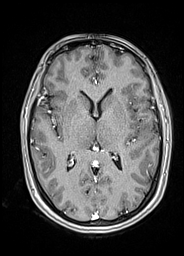
[im 109/175]
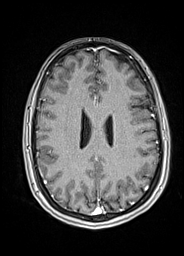
[im 131/175]
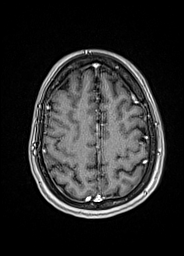
[im 153/175]
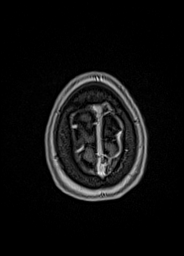
[im 175/175]
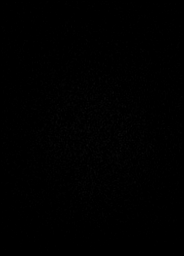

[Series 19: T1 post-contrast · coronal · 5.0mm · 0.57mm/px · 2 of 29 slices shown (2 of 2)]
[im 1/29]
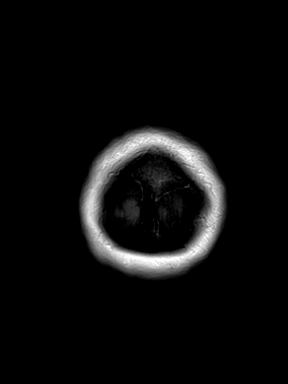
[im 29/29]
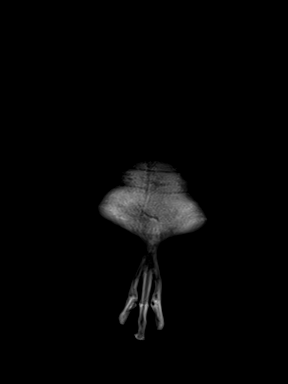

[48 of 48 positions shown; findings below may reference images not displayed]

FINDINGS: Mild intermittent motion degradation.

Brain:

Cerebral volume is normal.

Cerebellar tonsillar ectopia (right greater than left). The right
cerebellar tonsil extends 4-5 mm below the level of the foramen
magnum. This is borderline by measurement criteria for a Chiari I
malformation. However, no more than mild crowding is appreciated at
the level of the foramen magnum.

No cortical encephalomalacia is identified. No significant cerebral
white matter disease.

There is no acute infarct.

No evidence of an intracranial mass.

No chronic intracranial blood products.

No extra-axial fluid collection.

No midline shift.

Vascular: Maintained flow voids within the proximal large arterial
vessels.

Skull and upper cervical spine: No focal suspicious marrow lesion.

Sinuses/Orbits: No mass or acute finding within the imaged orbits.
Minimal mucosal thickening within the bilateral ethmoid sinuses.
IMPRESSION: 1. No evidence of acute intracranial abnormality.
2. Cerebellar tonsillar ectopia (right greater than left). The right
cerebellar tonsil extends 4-5 mm below the level foramen magnum.
This is borderline by measurement criteria for a Chiari I
malformation. However, no more than mild crowding is appreciated at
the level of the foramen magnum.
3. Otherwise unremarkable MRI appearance of the brain.
4. Minimal mucosal thickening within the bilateral ethmoid sinuses.

## 2022-07-13 DIAGNOSIS — R070 Pain in throat: Secondary | ICD-10-CM | POA: Diagnosis not present

## 2022-07-13 DIAGNOSIS — Z20822 Contact with and (suspected) exposure to covid-19: Secondary | ICD-10-CM | POA: Diagnosis not present

## 2022-07-13 DIAGNOSIS — B349 Viral infection, unspecified: Secondary | ICD-10-CM | POA: Diagnosis not present

## 2022-07-29 ENCOUNTER — Telehealth: Payer: Self-pay | Admitting: *Deleted

## 2022-07-29 NOTE — Telephone Encounter (Signed)
Janet Coleman (Key: [id number]) Rx #: [id number] Emgality 120MG /ML auto-injectors (migraine)  PA Complete this am waiting on approval

## 2022-07-30 NOTE — Telephone Encounter (Signed)
Emgality has been approved from [employer] via fax from 07/29/2022-07/30/2023.

## 2022-08-23 NOTE — Progress Notes (Deleted)
Office Visit Note  Patient: Janet Coleman             Date of Birth: [birth year]           MRN: [id number]             PCP: Jon Billings, NP Referring: Lurlean Horns, CNM Visit Date: 08/24/2022 Occupation: '@GUAROCC'$ @  Subjective:  No chief complaint on file.   History of Present Illness: Janet Coleman is a 37 y.o. 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 She also has history of hypothyroidism and migraines and possibly cervicogenic headache.***     Activities of Daily Living:  Patient reports morning stiffness for *** {minute/hour:19697}.   Patient {ACTIONS;DENIES/REPORTS:21021675::"Denies"} nocturnal pain.  Difficulty dressing/grooming: {ACTIONS;DENIES/REPORTS:21021675::"Denies"} Difficulty climbing stairs: {ACTIONS;DENIES/REPORTS:21021675::"Denies"} Difficulty getting out of chair: {ACTIONS;DENIES/REPORTS:21021675::"Denies"} Difficulty using hands for taps, buttons, cutlery, and/or writing: {ACTIONS;DENIES/REPORTS:21021675::"Denies"}  No Rheumatology ROS completed.   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   Migraines    Reactive hypoglycemia    Reactive hypoglycemia    Tobacco use    Vaginal Pap smear, abnormal     Family History  Problem Relation Age of Onset   Hypertension Mother    Autoimmune disease Mother    Stroke  Father    Hypertension Father    Heart attack Father    Aneurysm Father        brain   Heart disease Father    Cancer Maternal Aunt        breast   Cancer Maternal Grandmother        breast   Diabetes Maternal Grandmother    Stroke Maternal Grandfather    Diabetes Paternal Grandmother    Heart disease Paternal Grandmother    Migraines Neg Hx    Past Surgical History:  Procedure Laterality Date   BELPHAROPTOSIS REPAIR     eye lid lift   DILATION AND CURETTAGE OF UTERUS     x 2   DILATION AND EVACUATION N/A 06/30/2021   Procedure: DILATATION AND EVACUATION;  Surgeon: Harlin Heys, MD;  Location: [facility];  Service: Gynecology;  Laterality: N/A;   LEEP     x 2. last paps 2011, 2012, 2013 were normal   Social History   Social History Narrative   Not on file   Immunization History  Administered Date(s) Administered   DTaP 10/28/1990   Hepatitis B 05/07/1997, 05/28/1997   IPV 10/28/1990   Influenza,inj,Quad PF,6+ Mos 05/06/2017   Influenza-Unspecified 07/27/2005, 03/30/2018   MMR 10/28/1990   Moderna Sars-Covid-2 Vaccination 03/22/2020   Td 07/27/2005   Tdap 11/09/2018, 08/29/2020, 11/09/2020     Objective: Vital Signs: There were no vitals taken for this visit.   Physical Exam   Musculoskeletal Exam: ***  CDAI Exam: CDAI Score: -- Patient Global: --; Provider Global: -- Swollen: --; Tender: -- Joint  Exam 08/24/2022   No joint exam has been documented for this visit   There is currently no information documented on the homunculus. Go to the Rheumatology activity and complete the homunculus joint exam.  Investigation: No additional findings.  Imaging: No results found.  Recent Labs: Lab Results  Component Value Date   WBC 7.7 04/09/2022   HGB 14.1 04/09/2022   PLT 240 04/09/2022   NA 141 11/07/2021   K 4.2 11/07/2021   CL 106 11/07/2021   CO2 23 11/07/2021   GLUCOSE 79 11/07/2021   BUN 12 11/07/2021   CREATININE 0.63 11/07/2021   BILITOT  <0.2 11/07/2021   ALKPHOS 97 11/07/2021   AST 16 11/07/2021   ALT 9 11/07/2021   PROT 6.6 11/07/2021   ALBUMIN 4.3 11/07/2021   CALCIUM 8.8 11/07/2021   GFRAA >60 01/17/2017   QFTBGOLD Negative 08/07/2015   QFTBGOLDPLUS Negative 01/22/2021    Speciality Comments: No specialty comments available.  Procedures:  No procedures performed Allergies: Tramadol   Assessment / Plan:     Visit Diagnoses: No diagnosis found.  Orders: No orders of the defined types were placed in this encounter.  No orders of the defined types were placed in this encounter.   Face-to-face time spent with patient was *** minutes. Greater than 50% of time was spent in counseling and coordination of care.  Follow-Up Instructions: No follow-ups on file.   Collier Salina, MD  Note - This record has been created using [employer].  Chart creation errors have been sought, but may not always  have been located. Such creation errors do not reflect on  the standard of medical care.

## 2022-08-24 ENCOUNTER — Ambulatory Visit: Payer: Medicaid Other | Attending: Internal Medicine | Admitting: Internal Medicine

## 2022-08-31 DIAGNOSIS — J020 Streptococcal pharyngitis: Secondary | ICD-10-CM | POA: Diagnosis not present

## 2022-08-31 DIAGNOSIS — R070 Pain in throat: Secondary | ICD-10-CM | POA: Diagnosis not present

## 2022-09-02 ENCOUNTER — Ambulatory Visit (INDEPENDENT_AMBULATORY_CARE_PROVIDER_SITE_OTHER): Payer: Medicaid Other | Admitting: Nurse Practitioner

## 2022-09-02 ENCOUNTER — Encounter: Payer: Self-pay | Admitting: Nurse Practitioner

## 2022-09-02 VITALS — BP 115/80 | HR 85 | Temp 98.0°F | Ht 66.0 in | Wt 190.5 lb

## 2022-09-02 DIAGNOSIS — F339 Major depressive disorder, recurrent, unspecified: Secondary | ICD-10-CM | POA: Diagnosis not present

## 2022-09-02 DIAGNOSIS — E162 Hypoglycemia, unspecified: Secondary | ICD-10-CM

## 2022-09-02 DIAGNOSIS — Z136 Encounter for screening for cardiovascular disorders: Secondary | ICD-10-CM

## 2022-09-02 DIAGNOSIS — E039 Hypothyroidism, unspecified: Secondary | ICD-10-CM

## 2022-09-02 DIAGNOSIS — Z0000 Encounter for general adult medical examination without abnormal findings: Secondary | ICD-10-CM | POA: Diagnosis not present

## 2022-09-02 DIAGNOSIS — F419 Anxiety disorder, unspecified: Secondary | ICD-10-CM

## 2022-09-02 DIAGNOSIS — R7989 Other specified abnormal findings of blood chemistry: Secondary | ICD-10-CM

## 2022-09-02 MED ORDER — SERTRALINE HCL 100 MG PO TABS: 200.0000 mg | ORAL_TABLET | Freq: Every day | ORAL | 1 refills | Status: DC

## 2022-09-02 NOTE — Assessment & Plan Note (Signed)
Chronic. Not well controlled.  Will increase Zoloft to 200mg daily.  Follow up in 1 month.  Call sooner if concerns arise.  

## 2022-09-02 NOTE — Assessment & Plan Note (Signed)
Labs ordered at visit today.  Will make recommendations based on lab results.   

## 2022-09-02 NOTE — Progress Notes (Addendum)
BP 115/80   Pulse 85   Temp 98 F (36.7 C) (Oral)   Ht 5' 6"$  (1.676 m)   Wt 190 lb 8 oz (86.4 kg)   SpO2 98%   BMI 30.75 kg/m    Subjective:    Patient ID: Janet Coleman, female    DOB: Dec 25, 1985, 37 y.o.   MRN: [id number]  HPI: Janet Coleman is a 37 y.o. female presenting on 09/02/2022 for comprehensive medical examination. Current medical complaints include: MOOD  She currently lives with: Menopausal Symptoms: no  Patient states she still gets very stressed.  Does not feel like she is 100%.  Feels like the Zoloft helps but not completely.  Denies SI.   Depression Screen done today and results listed below:     09/02/2022    3:46 PM 12/25/2021   11:32 AM 11/07/2021    2:58 PM 05/29/2021   10:53 AM 01/22/2021    8:49 AM  Depression screen PHQ 2/9  Decreased Interest 1 2 1 $ 0 1  Down, Depressed, Hopeless 1 3 1 $ 0 1  PHQ - 2 Score 2 5 2 $ 0 2  Altered sleeping 1 2 1  $ 0  Tired, decreased energy 1 2 1  $ 0  Change in appetite 1 2 1  $ 0  Feeling bad or failure about yourself  1 0 1  0  Trouble concentrating 1 0 1  0  Moving slowly or fidgety/restless 0 1 1  0  Suicidal thoughts 0 0 0  0  PHQ-9 Score 7 12 8  2  $ Difficult doing work/chores Somewhat difficult Very difficult   Not difficult at all    The patient does not have a history of falls. I did complete a risk assessment for falls. A plan of care for falls was documented.   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   Migraines    Reactive hypoglycemia    Reactive hypoglycemia    Tobacco use    Vaginal Pap smear, abnormal     Surgical History:  Past Surgical History:  Procedure Laterality Date   BELPHAROPTOSIS REPAIR     eye lid lift   DILATION AND CURETTAGE OF UTERUS     x 2   DILATION AND EVACUATION N/A 06/30/2021   Procedure:  DILATATION AND EVACUATION;  Surgeon: Harlin Heys, MD;  Location: [facility] ORS;  Service: Gynecology;  Laterality: N/A;   LEEP     x 2. last paps 2011, 2012, 2013 were normal    Medications:  Current Outpatient Medications on File Prior to Visit  Medication Sig   amoxicillin (AMOXIL) 875 MG tablet Take 875 mg by mouth 2 (two) times daily.   Galcanezumab-gnlm (EMGALITY) 120 MG/ML SOAJ Inject 120 mg into the skin every 30 (thirty) days.   norethindrone-ethinyl estradiol-FE (JUNEL FE 1/20) 1-20 MG-MCG tablet Take 1 tablet by mouth daily.   QUEtiapine (SEROQUEL) 50 MG tablet Take 1 tablet (50 mg total) by mouth at bedtime.   Rimegepant Sulfate (NURTEC) 75 MG TBDP Take 75 mg by mouth daily. For migraines. Take as close to onset of migraine as possible. One daily maximum.   rizatriptan (MAXALT-MLT) 10 MG disintegrating tablet Take 10 mg by mouth as needed for migraine.   No current facility-administered medications on file prior to visit.  Allergies:  Allergies  Allergen Reactions   Tramadol Swelling    Social History:  Social History   Socioeconomic History   Marital status: Single    Spouse name: Not on file   Number of children: Not on file   Years of education: Not on file   Highest education level: Not on file  Occupational History   Not on file  Tobacco Use   Smoking status: Some Days    Packs/day: 0.25    Years: 17.00    Total pack years: 4.25    Types: Cigarettes   Smokeless tobacco: Never   Tobacco comments:    Pt vapes everyday  Vaping Use   Vaping Use: Some days  Substance and Sexual Activity   Alcohol use: Not Currently    Comment: occass   Drug use: No   Sexual activity: Not Currently    Birth control/protection: None  Other Topics Concern   Not on file  Social History Narrative   Not on file   Social Determinants of Health   Financial Resource Strain: Low Risk  (12/20/2018)   Overall Financial Resource Strain (CARDIA)    Difficulty of Paying  Living Expenses: Not hard at all  Food Insecurity: No Food Insecurity (12/20/2018)   Hunger Vital Sign    Worried About Running Out of Food in the Last Year: Never true    Ran Out of Food in the Last Year: Never true  Transportation Needs: No Transportation Needs (12/20/2018)   PRAPARE - Hydrologist (Medical): No    Lack of Transportation (Non-Medical): No  Physical Activity: Not on file  Stress: Not on file  Social Connections: Not on file  Intimate Partner Violence: Not At Risk (12/20/2018)   Humiliation, Afraid, Rape, and Kick questionnaire    Fear of Current or Ex-Partner: No    Emotionally Abused: No    Physically Abused: No    Sexually Abused: No   Social History   Tobacco Use  Smoking Status Some Days   Packs/day: 0.25   Years: 17.00   Total pack years: 4.25   Types: Cigarettes  Smokeless Tobacco Never  Tobacco Comments   Pt vapes everyday   Social History   Substance and Sexual Activity  Alcohol Use Not Currently   Comment: occass    Family History:  Family History  Problem Relation Age of Onset   Hypertension Mother    Autoimmune disease Mother    Stroke Father    Hypertension Father    Heart attack Father    Aneurysm Father        brain   Heart disease Father    Cancer Maternal Aunt        breast   Cancer Maternal Grandmother        breast   Diabetes Maternal Grandmother    Stroke Maternal Grandfather    Diabetes Paternal Grandmother    Heart disease Paternal Grandmother    Migraines Neg Hx     Past medical history, surgical history, medications, allergies, family history and social history reviewed with patient today and changes made to appropriate areas of the chart.   Review of Systems  Psychiatric/Behavioral:  Positive for depression. Negative for suicidal ideas. The patient is nervous/anxious.    All other ROS negative except what is listed above and in the HPI.      Objective:    BP 115/80   Pulse 85    Temp 98 F (36.7 C) (  Oral)   Ht 5' 6"$  (1.676 m)   Wt 190 lb 8 oz (86.4 kg)   SpO2 98%   BMI 30.75 kg/m   Wt Readings from Last 3 Encounters:  09/02/22 190 lb 8 oz (86.4 kg)  04/09/22 186 lb (84.4 kg)  04/08/22 184 lb (83.5 kg)    Physical Exam Vitals and nursing note reviewed.  Constitutional:      General: She is awake. She is not in acute distress.    Appearance: Normal appearance. She is well-developed. She is not ill-appearing.  HENT:     Head: Normocephalic and atraumatic.     Right Ear: Hearing, tympanic membrane, ear canal and external ear normal. No drainage.     Left Ear: Hearing, tympanic membrane, ear canal and external ear normal. No drainage.     Nose: Nose normal.     Right Sinus: No maxillary sinus tenderness or frontal sinus tenderness.     Left Sinus: No maxillary sinus tenderness or frontal sinus tenderness.     Mouth/Throat:     Mouth: Mucous membranes are moist.     Pharynx: Oropharynx is clear. Uvula midline. No pharyngeal swelling, oropharyngeal exudate or posterior oropharyngeal erythema.  Eyes:     General: Lids are normal.        Right eye: No discharge.        Left eye: No discharge.     Extraocular Movements: Extraocular movements intact.     Conjunctiva/sclera: Conjunctivae normal.     Pupils: Pupils are equal, round, and reactive to light.     Visual Fields: Right eye visual fields normal and left eye visual fields normal.  Neck:     Thyroid: No thyromegaly.     Vascular: No carotid bruit.     Trachea: Trachea normal.  Cardiovascular:     Rate and Rhythm: Normal rate and regular rhythm.     Heart sounds: Normal heart sounds. No murmur heard.    No gallop.  Pulmonary:     Effort: Pulmonary effort is normal. No accessory muscle usage or respiratory distress.     Breath sounds: Normal breath sounds.  Chest:  Breasts:    Right: Normal.     Left: Normal.  Abdominal:     General: Bowel sounds are normal.     Palpations: Abdomen is soft. There  is no hepatomegaly or splenomegaly.     Tenderness: There is no abdominal tenderness.  Musculoskeletal:        General: Normal range of motion.     Cervical back: Normal range of motion and neck supple.     Right lower leg: No edema.     Left lower leg: No edema.  Lymphadenopathy:     Head:     Right side of head: No submental, submandibular, tonsillar, preauricular or posterior auricular adenopathy.     Left side of head: No submental, submandibular, tonsillar, preauricular or posterior auricular adenopathy.     Cervical: No cervical adenopathy.     Upper Body:     Right upper body: No supraclavicular, axillary or pectoral adenopathy.     Left upper body: No supraclavicular, axillary or pectoral adenopathy.  Skin:    General: Skin is warm and dry.     Capillary Refill: Capillary refill takes less than 2 seconds.     Findings: No rash.  Neurological:     Mental Status: She is alert and oriented to person, place, and time.     Gait: Gait is intact.  Psychiatric:        Attention and Perception: Attention normal.        Mood and Affect: Mood normal.        Speech: Speech normal.        Behavior: Behavior normal. Behavior is cooperative.        Thought Content: Thought content normal.        Judgment: Judgment normal.     Results for orders placed or performed in visit on 09/02/22  CBC with Differential/Platelet  Result Value Ref Range   WBC 9.9 3.4 - 10.8 x10E3/uL   RBC 5.05 3.77 - 5.28 x10E6/uL   Hemoglobin 15.4 11.1 - 15.9 g/dL   Hematocrit 45.7 34.0 - 46.6 %   MCV 91 79 - 97 fL   MCH 30.5 26.6 - 33.0 pg   MCHC 33.7 31.5 - 35.7 g/dL   RDW 12.7 11.7 - 15.4 %   Platelets 257 150 - 450 x10E3/uL   Neutrophils 60 Not Estab. %   Lymphs 31 Not Estab. %   Monocytes 7 Not Estab. %   Eos 2 Not Estab. %   Basos 0 Not Estab. %   Neutrophils Absolute 5.9 1.4 - 7.0 x10E3/uL   Lymphocytes Absolute 3.0 0.7 - 3.1 x10E3/uL   Monocytes Absolute 0.7 0.1 - 0.9 x10E3/uL   EOS (ABSOLUTE)  0.2 0.0 - 0.4 x10E3/uL   Basophils Absolute 0.0 0.0 - 0.2 x10E3/uL   Immature Granulocytes 0 Not Estab. %   Immature Grans (Abs) 0.0 0.0 - 0.1 x10E3/uL  Comprehensive metabolic panel  Result Value Ref Range   Glucose 52 (L) 70 - 99 mg/dL   BUN 14 6 - 20 mg/dL   Creatinine, Ser 0.89 0.57 - 1.00 mg/dL   eGFR 86 >59 mL/min/1.73   BUN/Creatinine Ratio 16 9 - 23   Sodium 139 134 - 144 mmol/L   Potassium 4.4 3.5 - 5.2 mmol/L   Chloride 101 96 - 106 mmol/L   CO2 24 20 - 29 mmol/L   Calcium 9.4 8.7 - 10.2 mg/dL   Total Protein 7.1 6.0 - 8.5 g/dL   Albumin 4.5 3.9 - 4.9 g/dL   Globulin, Total 2.6 1.5 - 4.5 g/dL   Albumin/Globulin Ratio 1.7 1.2 - 2.2   Bilirubin Total <0.2 0.0 - 1.2 mg/dL   Alkaline Phosphatase 133 (H) 44 - 121 IU/L   AST 12 0 - 40 IU/L   ALT 7 0 - 32 IU/L  Lipid panel  Result Value Ref Range   Cholesterol, Total 211 (H) 100 - 199 mg/dL   Triglycerides 116 0 - 149 mg/dL   HDL 65 >39 mg/dL   VLDL Cholesterol Cal 20 5 - 40 mg/dL   LDL Chol Calc (NIH) 126 (H) 0 - 99 mg/dL   Chol/HDL Ratio 3.2 0.0 - 4.4 ratio  TSH  Result Value Ref Range   TSH 5.910 (H) 0.450 - 4.500 uIU/mL  Urinalysis, Routine w reflex microscopic  Result Value Ref Range   Specific Gravity, UA      >=1.030 (A) 1.005 - 1.030   pH, UA 6.5 5.0 - 7.5   Color, UA Yellow Yellow   Appearance Ur Clear Clear   Leukocytes,UA Negative Negative   Protein,UA Negative Negative/Trace   Glucose, UA Negative Negative   Ketones, UA Trace (A) Negative   RBC, UA Negative Negative   Bilirubin, UA Negative Negative   Urobilinogen, Ur 0.2 0.2 - 1.0 mg/dL   Nitrite, UA Negative Negative   Microscopic  Examination Comment   HgB A1c  Result Value Ref Range   Hgb A1c MFr Bld 5.1 4.8 - 5.6 %   Est. average glucose Bld gHb Est-mCnc 100 mg/dL      Assessment & Plan:   Problem List Items Addressed This Visit       Endocrine   Hypothyroidism   Relevant Orders   Thyroid Panel With TSH   Thyroid peroxidase antibody    Hypoglycemia    Labs ordered at visit today.  Will make recommendations based on lab results.        Relevant Orders   HgB A1c (Completed)     Other   Anxiety    Chronic. Not well controlled.  Will increase Zoloft to 247m daily.  Follow up in 1 month.  Call sooner if concerns arise.       Relevant Medications   sertraline (ZOLOFT) 100 MG tablet   Depression, recurrent (HCC)    Chronic. Not well controlled.  Will increase Zoloft to 2052mdaily.  Follow up in 1 month.  Call sooner if concerns arise.       Relevant Medications   sertraline (ZOLOFT) 100 MG tablet   Other Visit Diagnoses     Annual physical exam    -  Primary   Health maintenance reviewed during visit today.  Labs ordered.  Declined Flu shot.  PAP up to date.   Relevant Orders   CBC with Differential/Platelet (Completed)   Comprehensive metabolic panel (Completed)   Lipid panel (Completed)   TSH (Completed)   Urinalysis, Routine w reflex microscopic (Completed)   Screening for ischemic heart disease       Relevant Orders   Lipid panel (Completed)        Follow up plan: Return in about 1 month (around 10/01/2022) for Depression/Anxiety FU(virtually).   LABORATORY TESTING:  - Pap smear: up to date  IMMUNIZATIONS:   - Tdap: Tetanus vaccination status reviewed: last tetanus booster within 10 years. - Influenza: Refused - Pneumovax: Not applicable - Prevnar: Not applicable - COVID: Not applicable - HPV: Not applicable - Shingrix vaccine: Not applicable  SCREENING: -Mammogram: Not applicable  - Colonoscopy: Not applicable  - Bone Density: Not applicable  -Hearing Test: Not applicable  -Spirometry: Not applicable   PATIENT COUNSELING:   Advised to take 1 mg of folate supplement per day if capable of pregnancy.   Sexuality: Discussed sexually transmitted diseases, partner selection, use of condoms, avoidance of unintended pregnancy  and contraceptive alternatives.   Advised to avoid cigarette  smoking.  I discussed with the patient that most people either abstain from alcohol or drink within safe limits (<=14/week and <=4 drinks/occasion for males, <=7/weeks and <= 3 drinks/occasion for females) and that the risk for alcohol disorders and other health effects rises proportionally with the number of drinks per week and how often a drinker exceeds daily limits.  Discussed cessation/primary prevention of drug use and availability of treatment for abuse.   Diet: Encouraged to adjust caloric intake to maintain  or achieve ideal body weight, to reduce intake of dietary saturated fat and total fat, to limit sodium intake by avoiding high sodium foods and not adding table salt, and to maintain adequate dietary potassium and calcium preferably from fresh fruits, vegetables, and low-fat dairy products.    stressed the importance of regular exercise  Injury prevention: Discussed safety belts, safety helmets, smoke detector, smoking near bedding or upholstery.   Dental health: Discussed importance of regular tooth brushing,  flossing, and dental visits.    NEXT PREVENTATIVE PHYSICAL DUE IN 1 YEAR. Return in about 1 month (around 10/01/2022) for Depression/Anxiety FU(virtually).

## 2022-09-02 NOTE — Assessment & Plan Note (Signed)
Chronic. Not well controlled.  Will increase Zoloft to 200mg  daily.  Follow up in 1 month.  Call sooner if concerns arise.

## 2022-09-03 ENCOUNTER — Other Ambulatory Visit: Payer: Self-pay | Admitting: Nurse Practitioner

## 2022-09-03 LAB — URINALYSIS, ROUTINE W REFLEX MICROSCOPIC
Bilirubin, UA: NEGATIVE
Glucose, UA: NEGATIVE
Leukocytes,UA: NEGATIVE
Nitrite, UA: NEGATIVE
Protein,UA: NEGATIVE
RBC, UA: NEGATIVE
Specific Gravity, UA: >=1.03 — AB (ref 1.005–1.030)
Urobilinogen, Ur: 0.2 mg/dL (ref 0.2–1.0)
pH, UA: 6.5 (ref 5.0–7.5)

## 2022-09-03 LAB — CBC WITH DIFFERENTIAL/PLATELET
Basophils Absolute: 0 10*3/uL (ref 0.0–0.2)
Basos: 0 %
EOS (ABSOLUTE): 0.2 10*3/uL (ref 0.0–0.4)
Eos: 2 %
Hematocrit: 45.7 % (ref 34.0–46.6)
Hemoglobin: 15.4 g/dL (ref 11.1–15.9)
Immature Grans (Abs): 0 10*3/uL (ref 0.0–0.1)
Immature Granulocytes: 0 %
Lymphocytes Absolute: 3 10*3/uL (ref 0.7–3.1)
Lymphs: 31 %
MCH: 30.5 pg (ref 26.6–33.0)
MCHC: 33.7 g/dL (ref 31.5–35.7)
MCV: 91 fL (ref 79–97)
Monocytes Absolute: 0.7 10*3/uL (ref 0.1–0.9)
Monocytes: 7 %
Neutrophils Absolute: 5.9 10*3/uL (ref 1.4–7.0)
Neutrophils: 60 %
Platelets: 257 10*3/uL (ref 150–450)
RBC: 5.05 x10E6/uL (ref 3.77–5.28)
RDW: 12.7 % (ref 11.7–15.4)
WBC: 9.9 10*3/uL (ref 3.4–10.8)

## 2022-09-03 LAB — COMPREHENSIVE METABOLIC PANEL
ALT: 7 IU/L (ref 0–32)
AST: 12 IU/L (ref 0–40)
Albumin/Globulin Ratio: 1.7 (ref 1.2–2.2)
Albumin: 4.5 g/dL (ref 3.9–4.9)
Alkaline Phosphatase: 133 IU/L — ABNORMAL HIGH (ref 44–121)
BUN/Creatinine Ratio: 16 (ref 9–23)
BUN: 14 mg/dL (ref 6–20)
Bilirubin Total: <0.2 mg/dL (ref 0.0–1.2)
CO2: 24 mmol/L (ref 20–29)
Calcium: 9.4 mg/dL (ref 8.7–10.2)
Chloride: 101 mmol/L (ref 96–106)
Creatinine, Ser: 0.89 mg/dL (ref 0.57–1.00)
Globulin, Total: 2.6 g/dL (ref 1.5–4.5)
Glucose: 52 mg/dL — ABNORMAL LOW (ref 70–99)
Potassium: 4.4 mmol/L (ref 3.5–5.2)
Sodium: 139 mmol/L (ref 134–144)
Total Protein: 7.1 g/dL (ref 6.0–8.5)
eGFR: 86 mL/min/{1.73_m2} (ref >59)

## 2022-09-03 LAB — LIPID PANEL
Chol/HDL Ratio: 3.2 ratio (ref 0.0–4.4)
Cholesterol, Total: 211 mg/dL — ABNORMAL HIGH (ref 100–199)
HDL: 65 mg/dL (ref >39)
LDL Chol Calc (NIH): 126 mg/dL — ABNORMAL HIGH (ref 0–99)
Triglycerides: 116 mg/dL (ref 0–149)
VLDL Cholesterol Cal: 20 mg/dL (ref 5–40)

## 2022-09-03 LAB — HEMOGLOBIN A1C
Est. average glucose Bld gHb Est-mCnc: 100 mg/dL
Hgb A1c MFr Bld: 5.1 % (ref 4.8–5.6)

## 2022-09-03 LAB — TSH: TSH: 5.91 u[IU]/mL — ABNORMAL HIGH (ref 0.450–4.500)

## 2022-09-03 NOTE — Progress Notes (Signed)
Please let patient know that her lab work shows that your glucose is not elevated.  Make sure to consistently eat and drink as it does look like she was a little dehydrated during our visit.    Cholesterol is elevated.  I recommend a low fat diet and exercise.   Her thyroid lab was slightly elevated.  I would like her to come back in 6 weeks as a lab visit to recheck this.    No other concerns at this time.

## 2022-09-03 NOTE — Telephone Encounter (Signed)
Requested Prescriptions  Pending Prescriptions Disp Refills   busPIRone (BUSPAR) 5 MG tablet [Pharmacy Med Name: BUSPIRONE HCL 5 MG TAB] 180 tablet 0    Sig: TAKE 1 TABLET BY MOUTH TWICE DAILY     Psychiatry: Anxiolytics/Hypnotics - Non-controlled Passed - 09/03/2022 10:37 AM      Passed - Valid encounter within last 12 months    Recent Outpatient Visits           Yesterday Annual physical exam   [place], NP   3 months ago Depression, recurrent [facility])   [place], NP   4 months ago [place], NP   7 months ago Intractable chronic migraine without aura and with status migrainosus   [place], NP   8 months ago Chronic nonintractable headache, unspecified headache type   [place] Jon Billings, NP       Future Appointments             In 4 weeks Jon Billings, NP [facility]   In 1 month [place], Amy, NP [facility]

## 2022-10-01 ENCOUNTER — Encounter: Payer: Self-pay | Admitting: Nurse Practitioner

## 2022-10-01 ENCOUNTER — Telehealth (INDEPENDENT_AMBULATORY_CARE_PROVIDER_SITE_OTHER): Payer: Medicaid Other | Admitting: Nurse Practitioner

## 2022-10-01 DIAGNOSIS — F419 Anxiety disorder, unspecified: Secondary | ICD-10-CM

## 2022-10-01 DIAGNOSIS — F339 Major depressive disorder, recurrent, unspecified: Secondary | ICD-10-CM

## 2022-10-01 MED ORDER — QUETIAPINE FUMARATE 50 MG PO TABS: 75.0000 mg | ORAL_TABLET | Freq: Every day | ORAL | 1 refills | Status: DC

## 2022-10-01 MED ORDER — BUSPIRONE HCL 15 MG PO TABS: 15.0000 mg | ORAL_TABLET | Freq: Two times a day (BID) | ORAL | 1 refills | Status: DC

## 2022-10-01 NOTE — Progress Notes (Signed)
There were no vitals taken for this visit.   Subjective:    Patient ID: Janet Coleman, female    DOB: April 03, 1986, 37 y.o.   MRN: [id number]  HPI: Janet Coleman is a 37 y.o. female  Chief Complaint  Patient presents with   Anxiety    DEPRESSION/ANXIETY Patient states her anxiety is worse than it was before.  She is taking Buspar BID, Seroquel- she is waking up in the middle of the night for a couple of hours and taking Zoloft '100mg'$ .  Denies SI. She has had to cut back to PRN for work due to the stress and anxiety.  Feels almost nauseous when she wakes up in the morning.  [place] Office Visit from 09/02/2022 in [place]  PHQ-9 Total Score 7         10/01/2022    3:53 PM 09/02/2022    3:46 PM 12/25/2021   11:34 AM 11/07/2021    2:58 PM  GAD 7 : Generalized Anxiety Score  Nervous, Anxious, on Edge '3 1 2 1  '$ Control/stop worrying '3 1 3 1  '$ Worry too much - different things '3 1 3 1  '$ Trouble relaxing '3 1 2 1  '$ Restless '3 1 2 '$ 0  Easily annoyed or irritable '3 1 2 1  '$ Afraid - awful might happen '2 1 3 '$ 0  Total GAD 7 Score '20 7 17 5  '$ Anxiety Difficulty Extremely difficult Somewhat difficult Somewhat difficult Somewhat difficult     Relevant past medical, surgical, family and social history reviewed and updated as indicated. Interim medical history since our last visit reviewed. Allergies and medications reviewed and updated.  Review of Systems  Constitutional: Negative.   Respiratory: Negative.    Cardiovascular: Negative.   Musculoskeletal: Negative.   Psychiatric/Behavioral:  Positive for dysphoric mood. Negative for suicidal ideas. The patient is nervous/anxious.     Per HPI unless specifically indicated above     Objective:    There were no vitals taken for this visit.  Wt Readings from Last 3 Encounters:  09/02/22 190 lb 8 oz (86.4 kg)  04/09/22 186 lb (84.4 kg)  04/08/22 184 lb (83.5 kg)    Physical Exam Vitals and nursing note  reviewed.  HENT:     Head: Normocephalic.     Right Ear: Hearing normal.     Left Ear: Hearing normal.     Nose: Nose normal.  Eyes:     Pupils: Pupils are equal, round, and reactive to light.  Pulmonary:     Effort: Pulmonary effort is normal. No respiratory distress.  Neurological:     Mental Status: She is alert.  Psychiatric:        Mood and Affect: Mood normal.        Behavior: Behavior normal.        Thought Content: Thought content normal.        Judgment: Judgment normal.     Results for orders placed or performed in visit on 09/02/22  CBC with Differential/Platelet  Result Value Ref Range   WBC 9.9 3.4 - 10.8 x10E3/uL   RBC 5.05 3.77 - 5.28 x10E6/uL   Hemoglobin 15.4 11.1 - 15.9 g/dL   Hematocrit 45.7 34.0 - 46.6 %   MCV 91 79 - 97 fL   MCH 30.5 26.6 - 33.0 pg   MCHC 33.7 31.5 - 35.7 g/dL   RDW 12.7 11.7 - 15.4 %   Platelets 257 150 - 450 x10E3/uL  Neutrophils 60 Not Estab. %   Lymphs 31 Not Estab. %   Monocytes 7 Not Estab. %   Eos 2 Not Estab. %   Basos 0 Not Estab. %   Neutrophils Absolute 5.9 1.4 - 7.0 x10E3/uL   Lymphocytes Absolute 3.0 0.7 - 3.1 x10E3/uL   Monocytes Absolute 0.7 0.1 - 0.9 x10E3/uL   EOS (ABSOLUTE) 0.2 0.0 - 0.4 x10E3/uL   Basophils Absolute 0.0 0.0 - 0.2 x10E3/uL   Immature Granulocytes 0 Not Estab. %   Immature Grans (Abs) 0.0 0.0 - 0.1 x10E3/uL  Comprehensive metabolic panel  Result Value Ref Range   Glucose 52 (L) 70 - 99 mg/dL   BUN 14 6 - 20 mg/dL   Creatinine, Ser 0.89 0.57 - 1.00 mg/dL   eGFR 86 >59 mL/min/1.73   BUN/Creatinine Ratio 16 9 - 23   Sodium 139 134 - 144 mmol/L   Potassium 4.4 3.5 - 5.2 mmol/L   Chloride 101 96 - 106 mmol/L   CO2 24 20 - 29 mmol/L   Calcium 9.4 8.7 - 10.2 mg/dL   Total Protein 7.1 6.0 - 8.5 g/dL   Albumin 4.5 3.9 - 4.9 g/dL   Globulin, Total 2.6 1.5 - 4.5 g/dL   Albumin/Globulin Ratio 1.7 1.2 - 2.2   Bilirubin Total <0.2 0.0 - 1.2 mg/dL   Alkaline Phosphatase 133 (H) 44 - 121 IU/L   AST  12 0 - 40 IU/L   ALT 7 0 - 32 IU/L  Lipid panel  Result Value Ref Range   Cholesterol, Total 211 (H) 100 - 199 mg/dL   Triglycerides 116 0 - 149 mg/dL   HDL 65 >39 mg/dL   VLDL Cholesterol Cal 20 5 - 40 mg/dL   LDL Chol Calc (NIH) 126 (H) 0 - 99 mg/dL   Chol/HDL Ratio 3.2 0.0 - 4.4 ratio  TSH  Result Value Ref Range   TSH 5.910 (H) 0.450 - 4.500 uIU/mL  Urinalysis, Routine w reflex microscopic  Result Value Ref Range   Specific Gravity, UA      >=1.030 (A) 1.005 - 1.030   pH, UA 6.5 5.0 - 7.5   Color, UA Yellow Yellow   Appearance Ur Clear Clear   Leukocytes,UA Negative Negative   Protein,UA Negative Negative/Trace   Glucose, UA Negative Negative   Ketones, UA Trace (A) Negative   RBC, UA Negative Negative   Bilirubin, UA Negative Negative   Urobilinogen, Ur 0.2 0.2 - 1.0 mg/dL   Nitrite, UA Negative Negative   Microscopic Examination Comment   HgB A1c  Result Value Ref Range   Hgb A1c MFr Bld 5.1 4.8 - 5.6 %   Est. average glucose Bld gHb Est-mCnc 100 mg/dL      Assessment & Plan:   Problem List Items Addressed This Visit       Other   Anxiety    Chronic.  Not well controlled.  Anxiety is worse than Depression.  Will increase Buspar to '10mg'$  BID.  Will increase Seroquel to '75mg'$  nightly.  Side effects and benefits discussed during visit.  Follow up in 1 month.  Call sooner if concerns arise.       Relevant Medications   busPIRone (BUSPAR) 15 MG tablet   Depression, recurrent ([place]) - Primary    Chronic.  Not well controlled.  Anxiety is worse than Depression.  Will increase Buspar to '10mg'$  BID.  Will increase Seroquel to '75mg'$  nightly.  Side effects and benefits discussed during visit.  Follow up  in 1 month.  Call sooner if concerns arise.       Relevant Medications   busPIRone (BUSPAR) 15 MG tablet     Follow up plan: Return in about 1 month (around 11/01/2022) for Depression/Anxiety FU (virutal).    This visit was completed via MyChart due to the restrictions of  the COVID-19 pandemic. All issues as above were discussed and addressed. Physical exam was done as above through visual confirmation on MyChart. If it was felt that the patient should be evaluated in the office, they were directed there. The patient verbally consented to this visit. Location of the patient: Home Location of the provider: Office Those involved with this call:  Provider: Jon Billings, NP CMA: Yvonna Alanis, CMA Front Desk/Registration: Lynnell Catalan This encounter was conducted via video.  I spent 30 dedicated to the care of this patient on the date of this encounter to include previsit review of medications, symptoms, plan of care, follow up, face to face time with the patient, and post visit ordering of testing.

## 2022-10-01 NOTE — Assessment & Plan Note (Signed)
Chronic.  Not well controlled.  Anxiety is worse than Depression.  Will increase Buspar to '10mg'$  BID.  Will increase Seroquel to '75mg'$  nightly.  Side effects and benefits discussed during visit.  Follow up in 1 month.  Call sooner if concerns arise.

## 2022-10-02 NOTE — Progress Notes (Signed)
Pt was called and scheduled for a virtual visit.

## 2022-10-08 NOTE — Progress Notes (Deleted)
PATIENT: Janet Coleman DOB: Sep 21, 1985  REASON FOR VISIT: follow up HISTORY FROM: patient  Virtual Visit via Telephone Note  I connected with Janet Coleman on 10/08/22 at  9:30 AM EDT by telephone and verified that I am speaking with the correct person using two identifiers.   I discussed the limitations, risks, security and privacy concerns of performing an evaluation and management service by telephone and the availability of in person appointments. I also discussed with the patient that there may be a patient responsible charge related to this service. The patient expressed understanding and agreed to proceed.   History of Present Illness:  10/08/22 ALL (Mychart): Janet Coleman is a 37 y.o. female here today for follow up for migraines. She continues Teaching laboratory technician and Nurtec.   04/08/22 ALL:  Janet Coleman is a 37 y.o. female here today for follow up for migraines. She was seen in consult with Dr Jaynee Eagles 12/2021 and advised to stop amitriptyline and nortriptyline, continue topiramate and add Ajovy. Rizatriptan as not effective and Nurtec advised for abortive therapy. Ajovy was not covered by insurance and she was switched to [employer]. Since, she reports taking one injection (Ajovy sample was given in office so she had two injections). She reports a malfunction with second Emgality injection and is waiting for a replacement. She reports Nurtec did help but she has not requested refills. She reports anxiety is really bad right now. She feels nausea may be related to anxiety. She is working with PCP to help manage anxiety.    HISTORY (copied from Dr Cathren Laine previous note)   HPI:  Janet Coleman is a 37 y.o. female here as requested by Jon Billings, NP for migraines. Started after a miscarriage in Dec 2022. About 6 months have been worsening. Started getting migraines years ago but a sumatriptan helped. It is a lot of pressure, like a headband, her trigger spots are in the  temples and on the top of her head, her scalp is very sensitive all over and her neck, stiffness in her neck. Muscular tension in the neck. Heat helps with the muscular pain. No family history if migraines. She has photophobia/phonophobia, nausea, pulsating/pounding, no vomiting but like she wants to. No vision changes. No hearing changes or swishing in her ears. Her blood pressure is fine. The headaches can be severe. Sumatriptan helps a little but not a lot. She went to urgent care and got a shot which helped. She has 4 kids and is not having anymore, using birth control. She has been to the eye doctor and her eyes are fine and her glasses were updated.    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 The right cerebellar tonsil extends 4-5 mm below the level of the foramen magnum. 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 However, no more than mild crowding is appreciated at the level of the foramen magnum.   No cortical encephalomalacia is identified. No significant cerebral white matter disease.   There is no acute infarct.   No evidence of an intracranial mass.   No chronic intracranial blood products.   No extra-axial fluid collection.   No midline shift.   Vascular: Maintained flow voids within the proximal large arterial vessels.   Skull and upper cervical spine: No focal suspicious marrow lesion.   Sinuses/Orbits: No mass or  acute finding within the imaged orbits. Minimal mucosal thickening within the bilateral ethmoid sinuses.   IMPRESSION: 1. No evidence of acute intracranial abnormality. 2. 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 The right cerebellar tonsil extends 4-5 mm below the level foramen magnum. 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 However,  no more than mild crowding is appreciated at the level of the foramen magnum. 3. Otherwise unremarkable MRI appearance of the brain. 4. Minimal mucosal thickening within the bilateral ethmoid sinuses.   Observations/Objective:  Generalized: Well developed, in no acute distress  Mentation: Alert oriented to time, place, history taking. Follows all commands speech and language fluent   Assessment and Plan:  37 y.o. year old female  has a past medical history of GERD (gastroesophageal reflux disease), Hypertension, Hypothyroidism, Localized morphea, Migraines, Reactive hypoglycemia, Reactive hypoglycemia, Tobacco use, and Vaginal Pap smear, abnormal. here with  No diagnosis found.  No orders of the defined types were placed in this encounter.   No orders of the defined types were placed in this encounter.    Follow Up Instructions:  I discussed the assessment and treatment plan with the patient. The patient was provided an opportunity to ask questions and all were answered. The patient agreed with the plan and demonstrated an understanding of the instructions.   The patient was advised to call back or seek an in-person evaluation if the symptoms worsen or if the condition fails to improve as anticipated.  I provided *** minutes of non-face-to-face time during this encounter. Patient located at their place of residence during [place] visit. Provider is in the office.    Debbora Presto, NP

## 2022-10-13 ENCOUNTER — Other Ambulatory Visit: Payer: Medicaid Other

## 2022-10-13 ENCOUNTER — Telehealth: Payer: Medicaid Other | Admitting: Family Medicine

## 2022-10-15 ENCOUNTER — Other Ambulatory Visit: Payer: Medicaid Other

## 2022-10-15 DIAGNOSIS — E039 Hypothyroidism, unspecified: Secondary | ICD-10-CM | POA: Diagnosis not present

## 2022-10-16 LAB — THYROID PANEL WITH TSH
Free Thyroxine Index: 1.8 (ref 1.2–4.9)
T3 Uptake Ratio: 26 % (ref 24–39)
T4, Total: 7.1 ug/dL (ref 4.5–12.0)
TSH: 3.43 u[IU]/mL (ref 0.450–4.500)

## 2022-10-16 LAB — THYROID PEROXIDASE ANTIBODY: Thyroperoxidase Ab SerPl-aCnc: 31 IU/mL (ref 0–34)

## 2022-10-16 NOTE — Progress Notes (Signed)
HI Janet Coleman. Your thyroid labs are normal.  No need for medication.  We will monitor them in the future.

## 2022-11-04 ENCOUNTER — Telehealth (INDEPENDENT_AMBULATORY_CARE_PROVIDER_SITE_OTHER): Payer: Medicaid Other | Admitting: Nurse Practitioner

## 2022-11-04 ENCOUNTER — Encounter: Payer: Self-pay | Admitting: Nurse Practitioner

## 2022-11-04 DIAGNOSIS — F339 Major depressive disorder, recurrent, unspecified: Secondary | ICD-10-CM

## 2022-11-04 DIAGNOSIS — F419 Anxiety disorder, unspecified: Secondary | ICD-10-CM | POA: Diagnosis not present

## 2022-11-04 NOTE — Progress Notes (Signed)
There were no vitals taken for this visit.   Subjective:    Patient ID: Janet Coleman, female    DOB: 12/02/1985, 37 y.o.   MRN: [id number]  HPI: Janet Coleman is a 37 y.o. female  Chief Complaint  Patient presents with   Depression   Anxiety    DEPRESSION/ANXIETY Patient states her anxiety is still bad.  She states she is sleeping better.  Feels like her leg pain and the abiligy to relax has improved.  She is getting more than 6 hours a night which is an improvement.  Still having nausea in the morning.  She is starting a job closer to home next week and [place]'t have [place]. She feels like [place] her anxiety was worse.   Flowsheet Row Video Visit from 11/04/2022 in [facility]  PHQ-9 Total Score 10         11/04/2022    2:51 PM 10/01/2022    3:53 PM 09/02/2022    3:46 PM 12/25/2021   11:34 AM  GAD 7 : Generalized Anxiety Score  Nervous, Anxious, on Edge 2 3 1 2   Control/stop worrying 2 3 1 3   Worry too much - different things 2 3 1 3   Trouble relaxing 2 3 1 2   Restless 2 3 1 2   Easily annoyed or irritable 2 3 1 2   Afraid - awful might happen 2 2 1 3   Total GAD 7 Score 14 20 7 17   Anxiety Difficulty Somewhat difficult Extremely difficult Somewhat difficult Somewhat difficult     Relevant past medical, surgical, family and social history reviewed and updated as indicated. Interim medical history since our last visit reviewed. Allergies and medications reviewed and updated.  Review of Systems  Constitutional: Negative.   Respiratory: Negative.    Cardiovascular: Negative.   Musculoskeletal: Negative.   Psychiatric/Behavioral:  Positive for dysphoric mood. Negative for suicidal ideas. The patient is nervous/anxious.     Per HPI unless specifically indicated above     Objective:    There were no vitals taken for this visit.  Wt Readings from Last 3 Encounters:  09/02/22 190 lb 8 oz  (86.4 kg)  04/09/22 186 lb (84.4 kg)  04/08/22 184 lb (83.5 kg)    Physical Exam Vitals and nursing note reviewed.  HENT:     Head: Normocephalic.     Right Ear: Hearing normal.     Left Ear: Hearing normal.     Nose: Nose normal.  Eyes:     Pupils: Pupils are equal, round, and reactive to light.  Pulmonary:     Effort: Pulmonary effort is normal. No respiratory distress.  Neurological:     Mental Status: She is alert.  Psychiatric:        Mood and Affect: Mood normal.        Behavior: Behavior normal.        Thought Content: Thought content normal.        Judgment: Judgment normal.     Results for orders placed or performed in visit on 10/15/22  Thyroid peroxidase antibody  Result Value Ref Range   Thyroperoxidase Ab SerPl-aCnc 31 0 - 34 IU/mL  Thyroid Panel With TSH  Result Value Ref Range   TSH 3.430 0.450 - 4.500 uIU/mL   T4, Total 7.1 4.5 - 12.0 ug/dL   T3 Uptake Ratio 26 24 - 39 %   Free Thyroxine Index 1.8  1.2 - 4.9      Assessment & Plan:   Problem List Items Addressed This Visit       Other   Anxiety - Primary    Chronic. Ongoing.  Sleep has improved. She is starting a new job which will hopefully help with symptoms.  Will continue with current doses of medications at this time.  Follow up in 6 weeks for reevaluation.  Call sooner if concerns arise.       Depression, recurrent    Chronic. Ongoing.  Sleep has improved. She is starting a new job which will hopefully help with symptoms.  Will continue with current doses of medications at this time.  Follow up in 6 weeks for reevaluation.  Call sooner if concerns arise.         Follow up plan: Return in about 6 weeks (around 12/16/2022) for Depression/Anxiety FU (virtual).    This visit was completed via MyChart due to the restrictions of the COVID-19 pandemic. All issues as above were discussed and addressed. Physical exam was done as above through visual confirmation on MyChart. If it was felt that the  patient should be evaluated in the office, they were directed there. The patient verbally consented to this visit. Location of the patient: Home Location of the provider: Office Those involved with this call:  Provider: Larae Grooms, NP CMA: Wilhemena Durie, CMA Front Desk/Registration: Servando Snare This encounter was conducted via video.  I spent 20 dedicated to the care of this patient on the date of this encounter to include previsit review of medications, symptoms, plan of care, follow up, face to face time with the patient, and post visit ordering of testing.

## 2022-11-04 NOTE — Assessment & Plan Note (Signed)
Chronic. Ongoing.  Sleep has improved. She is starting a new job which will hopefully help with symptoms.  Will continue with current doses of medications at this time.  Follow up in 6 weeks for reevaluation.  Call sooner if concerns arise.  

## 2022-11-04 NOTE — Assessment & Plan Note (Signed)
Chronic. Ongoing.  Sleep has improved. She is starting a new job which will hopefully help with symptoms.  Will continue with current doses of medications at this time.  Follow up in 6 weeks for reevaluation.  Call sooner if concerns arise.

## 2022-11-04 NOTE — Progress Notes (Signed)
Scheduled patient for 12/16/2022 @ 4:20 pm.

## 2022-11-25 DIAGNOSIS — N898 Other specified noninflammatory disorders of vagina: Secondary | ICD-10-CM | POA: Diagnosis not present

## 2022-11-25 DIAGNOSIS — R350 Frequency of micturition: Secondary | ICD-10-CM | POA: Diagnosis not present

## 2022-11-25 DIAGNOSIS — N390 Urinary tract infection, site not specified: Secondary | ICD-10-CM | POA: Diagnosis not present

## 2022-12-16 ENCOUNTER — Telehealth: Payer: 59 | Admitting: Nurse Practitioner

## 2022-12-16 NOTE — Progress Notes (Deleted)
There were no vitals taken for this visit.   Subjective:    Patient ID: Janet Coleman, female    DOB: 16-Aug-1985, 37 y.o.   MRN: [id number]  HPI: Janet Coleman is a 37 y.o. female  No chief complaint on file.   DEPRESSION/ANXIETY Patient states her anxiety is still bad.  She states she is sleeping better.  Feels like her leg pain and the abiligy to relax has improved.  She is getting more than 6 hours a night which is an improvement.  Still having nausea in the morning.  She is starting a job closer to home next week and won't have to [place] interstate. She feels like [place] her anxiety was worse.   Flowsheet Row Video Visit from 11/04/2022 in [facility]  PHQ-9 Total Score 10         11/04/2022    2:51 PM 10/01/2022    3:53 PM 09/02/2022    3:46 PM 12/25/2021   11:34 AM  GAD 7 : Generalized Anxiety Score  Nervous, Anxious, on Edge 2 3 1 2   Control/stop worrying 2 3 1 3   Worry too much - different things 2 3 1 3   Trouble relaxing 2 3 1 2   Restless 2 3 1 2   Easily annoyed or irritable 2 3 1 2   Afraid - awful might happen 2 2 1 3   Total GAD 7 Score 14 20 7 17   Anxiety Difficulty Somewhat difficult Extremely difficult Somewhat difficult Somewhat difficult     Relevant past medical, surgical, family and social history reviewed and updated as indicated. Interim medical history since our last visit reviewed. Allergies and medications reviewed and updated.  Review of Systems  Constitutional: Negative.   Respiratory: Negative.    Cardiovascular: Negative.   Musculoskeletal: Negative.   Psychiatric/Behavioral:  Positive for dysphoric mood. Negative for suicidal ideas. The patient is nervous/anxious.     Per HPI unless specifically indicated above     Objective:    There were no vitals taken for this visit.  Wt Readings from Last 3 Encounters:  09/02/22 190 lb 8 oz (86.4 kg)  04/09/22 186 lb (84.4 kg)   04/08/22 184 lb (83.5 kg)    Physical Exam Vitals and nursing note reviewed.  HENT:     Head: Normocephalic.     Right Ear: Hearing normal.     Left Ear: Hearing normal.     Nose: Nose normal.  Eyes:     Pupils: Pupils are equal, round, and reactive to light.  Pulmonary:     Effort: Pulmonary effort is normal. No respiratory distress.  Neurological:     Mental Status: She is alert.  Psychiatric:        Mood and Affect: Mood normal.        Behavior: Behavior normal.        Thought Content: Thought content normal.        Judgment: Judgment normal.    Results for orders placed or performed in visit on 10/15/22  Thyroid peroxidase antibody  Result Value Ref Range   Thyroperoxidase Ab SerPl-aCnc 31 0 - 34 IU/mL  Thyroid Panel With TSH  Result Value Ref Range   TSH 3.430 0.450 - 4.500 uIU/mL   T4, Total 7.1 4.5 - 12.0 ug/dL   T3 Uptake Ratio 26 24 - 39 %   Free Thyroxine Index 1.8 1.2 - 4.9      Assessment &  Plan:   Problem List Items Addressed This Visit   None    Follow up plan: No follow-ups on file.    This visit was completed via MyChart due to the restrictions of the COVID-19 pandemic. All issues as above were discussed and addressed. Physical exam was done as above through visual confirmation on MyChart. If it was felt that the patient should be evaluated in the office, they were directed there. The patient verbally consented to this visit. Location of the patient: Home Location of the provider: Office Those involved with this call:  Provider: Larae Grooms, NP CMA: Wilhemena Durie, CMA Front Desk/Registration: Servando Snare This encounter was conducted via video.  I spent 20 dedicated to the care of this patient on the date of this encounter to include previsit review of medications, symptoms, plan of care, follow up, face to face time with the patient, and post visit ordering of testing.

## 2022-12-28 ENCOUNTER — Telehealth: Payer: Self-pay | Admitting: Obstetrics

## 2022-12-28 NOTE — Telephone Encounter (Signed)
Patient called requesting a Quantiferon -TB test.  She is unable to get it at her PCP and the Health department is book out for weeks.  Is it possible to have an order for this?

## 2022-12-29 ENCOUNTER — Other Ambulatory Visit: Payer: Self-pay | Admitting: Obstetrics

## 2022-12-29 DIAGNOSIS — Z111 Encounter for screening for respiratory tuberculosis: Secondary | ICD-10-CM

## 2022-12-29 NOTE — Telephone Encounter (Signed)
I placed the order and she can schedule a lab-only appt at her convenience.  Thanks!  Missy

## 2022-12-29 NOTE — Progress Notes (Signed)
Order placed for Janet Coleman per pt request.  Glenetta Borg, CNM

## 2022-12-31 ENCOUNTER — Other Ambulatory Visit: Payer: Medicaid Other

## 2022-12-31 DIAGNOSIS — Z111 Encounter for screening for respiratory tuberculosis: Secondary | ICD-10-CM

## 2023-01-05 ENCOUNTER — Encounter: Payer: Self-pay | Admitting: Obstetrics

## 2023-01-05 LAB — QUANTIFERON-TB GOLD PLUS
QuantiFERON Mitogen Value: >10 IU/mL
QuantiFERON Nil Value: 0.01 IU/mL
QuantiFERON TB1 Ag Value: 0.01 IU/mL
QuantiFERON TB2 Ag Value: 0.01 IU/mL
QuantiFERON-TB Gold Plus: NEGATIVE

## 2023-02-17 DIAGNOSIS — M79661 Pain in right lower leg: Secondary | ICD-10-CM | POA: Diagnosis not present

## 2023-05-19 ENCOUNTER — Telehealth: Payer: Self-pay | Admitting: Pharmacy Technician

## 2023-05-19 NOTE — Telephone Encounter (Signed)
Pharmacy Patient Advocate Encounter   Received notification from CoverMyMeds that prior authorization for Emgality 120MG /ML auto-injectors (migraine) is required/requested.   Insurance verification completed.   The patient is insured through [employer] [facility] .   Per test claim: PA required; PA started via CoverMyMeds. KEY [id number] . Waiting for clinical questions to populate.

## 2023-05-28 ENCOUNTER — Other Ambulatory Visit (HOSPITAL_COMMUNITY): Payer: Self-pay

## 2023-05-28 NOTE — Telephone Encounter (Signed)
Pharmacy Patient Advocate Encounter  Received notification from [employer] [facility] that Prior Authorization for Emgality 120MG /ML auto-injectors (migraine) has been APPROVED from N/A to 07/30/2023   PA #/Case ID/Reference #: [id number]/A

## 2023-07-01 ENCOUNTER — Telehealth: Payer: Self-pay

## 2023-07-01 NOTE — Telephone Encounter (Signed)
Received a request per CMM to renew PA for Nurtec-PT has not been seen since 2023 and had a no show appointment-Please advise.

## 2023-07-05 NOTE — Telephone Encounter (Signed)
You do not have to complete. Pt will need updated visit before we can complete at this point.

## 2023-07-26 ENCOUNTER — Ambulatory Visit
Admission: EM | Admit: 2023-07-26 | Discharge: 2023-07-26 | Disposition: A | Discharge status: Home / Self Care | Payer: Medicaid Other | Attending: Emergency Medicine | Admitting: Emergency Medicine

## 2023-07-26 ENCOUNTER — Ambulatory Visit: Payer: Self-pay | Admitting: *Deleted

## 2023-07-26 DIAGNOSIS — R300 Dysuria: Secondary | ICD-10-CM

## 2023-07-26 LAB — POCT URINALYSIS DIP (MANUAL ENTRY)
Bilirubin, UA: NEGATIVE
Glucose, UA: NEGATIVE mg/dL
Nitrite, UA: NEGATIVE
Protein Ur, POC: 100 mg/dL — AB
Spec Grav, UA: >=1.03 — AB
Urobilinogen, UA: 0.2 U/dL
pH, UA: 5.5

## 2023-07-26 MED ORDER — PHENAZOPYRIDINE HCL 200 MG PO TABS: 200.0000 mg | ORAL_TABLET | Freq: Three times a day (TID) | ORAL | 0 refills | Status: DC

## 2023-07-26 MED ORDER — ONDANSETRON 4 MG PO TBDP
4.0000 mg | ORAL_TABLET | Freq: Three times a day (TID) | ORAL | 0 refills | Status: DC | PRN
Start: 2023-07-26 — End: 2023-08-06

## 2023-07-26 MED ORDER — ONDANSETRON 4 MG PO TBDP
4.0000 mg | ORAL_TABLET | Freq: Three times a day (TID) | ORAL | 0 refills | Status: DC | PRN
Start: 2023-07-26 — End: 2023-07-26

## 2023-07-26 MED ORDER — NITROFURANTOIN MONOHYD MACRO 100 MG PO CAPS: 100.0000 mg | ORAL_CAPSULE | Freq: Two times a day (BID) | ORAL | 0 refills | Status: DC

## 2023-07-26 NOTE — Discharge Instructions (Addendum)
Your urinalysis does  not show bacteria currently, your urine will be sent to the lab to determine exactly which bacteria is present, if any changes need to be made to your medications you will be notified  Begin use of macrobid every morning and every evening for 5 days  May Use Zofran every 8 hours to help with nausea and vomiting placed under tongue and allow to dissolve  You may use Pyridium every 8 hours to help minimize your symptoms until antibiotic removes bacteria, this medication will turn your urine orange  Increase your fluid intake through use of water  As always practice good hygiene, wiping front to back and avoidance of scented vaginal products to prevent further irritation  If symptoms continue to persist after use of medication or recur please follow-up with urgent care or your primary doctor as needed

## 2023-07-26 NOTE — Telephone Encounter (Signed)
Reason for Disposition  [1] SEVERE pain with urination (e.g., excruciating) AND [2] not improved after 2 hours of pain medicine and Sitz bath  Answer Assessment - Initial Assessment Questions 1. SEVERITY: "How bad is the pain?"  (e.g., Scale 1-10; mild, moderate, or severe)   - MILD (1-3): complains slightly about urination hurting   - MODERATE (4-7): interferes with normal activities     - SEVERE (8-10): excruciating, unwilling or unable to urinate because of the pain      I have UTI.   This morning I was fine.    Then urinated a few min. Ago and it burned so bad.   When I wipe it's pink.   I'm having nausea due to the pain.    No lower back flank pain.    It just came on a few minutes.    2. FREQUENCY: "How many times have you had painful urination today?"      Every time 3. PATTERN: "Is pain present every time you urinate or just sometimes?"      Yes 4. ONSET: "When did the painful urination start?"      This morning 5. FEVER: "Do you have a fever?" If Yes, ask: "What is your temperature, how was it measured, and when did it start?"     No 6. PAST UTI: "Have you had a urine infection before?" If Yes, ask: "When was the last time?" and "What happened that time?"      I had a few of these in the past few years. 7. CAUSE: "What do you think is causing the painful urination?"  (e.g., UTI, scratch, Herpes sore)     UTI    I've had these many times 8. OTHER SYMPTOMS: "Do you have any other symptoms?" (e.g., blood in urine, flank pain, genital sores, urgency, vaginal discharge)     Pink urine when I wipe 9. PREGNANCY: "Is there any chance you are pregnant?" "When was your last menstrual period?"     Not asked  Protocols used: Urination Pain - Female-A-AH

## 2023-07-26 NOTE — Telephone Encounter (Signed)
  Chief Complaint: Burning with urination Symptoms: pink on tissue when she wipes Frequency: Just started this morning Pertinent Negatives: Patient denies fever or flank pain Disposition: [] ED /[] Urgent Care (no appt availability in office) / [] Appointment(In office/virtual)/ [x]  [facility]/ [] Home Care/ [] Refused Recommended Disposition /[] [place] [facility]/ []  Follow-up with PCP Additional Notes: No appts available with any of the providers at [facility].   Made her an Urgent Virtual Care Visit via MyChart for 07/27/2023 at 11:15 AM.     Let her know getting some AZO from store and drinking plenty of water would help with the burning.

## 2023-07-26 NOTE — ED Provider Notes (Signed)
Janet Coleman    CSN: [id number] Arrival date & time: 07/26/23  1356      History   Chief Complaint No chief complaint on file.   HPI Janet Coleman is a 37 y.o. female.   Patient presents for evaluation of dysuria, hematuria, incomplete bladder emptying, lower abdominal pain and pressure and nausea without vomiting beginning this morning.  Has not attempted treatment of symptoms.  Denies flank pain, fever or vaginal symptoms.   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   Migraines    Reactive hypoglycemia    Reactive hypoglycemia    Tobacco use    Vaginal Pap smear, abnormal     Patient Active Problem List   Diagnosis Date Noted   Depression, recurrent (HCC) 06/01/2022   Intractable chronic migraine without aura and with status migrainosus 01/21/2022   Chronic nonintractable headache 12/25/2021   Anxiety 12/11/2021   Acute blood loss anemia 06/30/2021   Hypoglycemia 02/08/2015   GERD (gastroesophageal reflux disease)    Hypertension    Hypothyroidism    Tobacco use    Migraines     Past Surgical History:  Procedure Laterality Date   BELPHAROPTOSIS REPAIR     eye lid lift   DILATION AND CURETTAGE OF UTERUS     x 2   DILATION AND EVACUATION N/A 06/30/2021   Procedure: DILATATION AND EVACUATION;  Surgeon: Linzie Collin, MD;  Location: [facility] ORS;  Service: Gynecology;  Laterality: N/A;   LEEP     x 2. last paps 2011, 2012, 2013 were normal    OB History     Gravida  7   Para  4   Term  4   Preterm      AB  3   Living  4      SAB  3   IAB      Ectopic      Multiple  0   Live Births  4            Home Medications    Prior to Admission medications   Medication Sig Start Date End Date Taking? Authorizing Provider  busPIRone (BUSPAR) 15 MG tablet Take 1 tablet  (15 mg total) by mouth 2 (two) times daily. 10/01/22   Larae Grooms, NP  Galcanezumab-gnlm (EMGALITY) 120 MG/ML SOAJ Inject 120 mg into the skin every 30 (thirty) days. 01/29/22   Anson Fret, MD  nitrofurantoin, macrocrystal-monohydrate, (MACROBID) 100 MG capsule Take 1 capsule (100 mg total) by mouth 2 (two) times daily. 07/26/23   Valinda Hoar, NP  norethindrone-ethinyl estradiol-FE (JUNEL FE 1/20) 1-20 MG-MCG tablet Take 1 tablet by mouth daily. 11/10/21   Johnson, Megan P, DO  ondansetron (ZOFRAN-ODT) 4 MG disintegrating tablet Take 1 tablet (4 mg total) by mouth every 8 (eight) hours as needed for nausea or vomiting. 07/26/23   Valinda Hoar, NP  phenazopyridine (PYRIDIUM) 200 MG tablet Take 1 tablet (200 mg total) by mouth 3 (three) times daily. 07/26/23   Rawad Bochicchio, Elita Boone, NP  QUEtiapine (SEROQUEL) 50 MG tablet Take 1.5 tablets (75 mg total) by mouth at bedtime. 10/01/22   Larae Grooms, NP  Rimegepant Sulfate (NURTEC) 75 MG TBDP Take 75 mg by mouth daily. For migraines. Take as close to onset of migraine as possible. One daily maximum.  05/25/22   Lomax, Amy, NP  rizatriptan (MAXALT-MLT) 10 MG disintegrating tablet Take 10 mg by mouth as needed for migraine. 05/25/22   [provider]  sertraline (ZOLOFT) 100 MG tablet Take 2 tablets (200 mg total) by mouth daily. 09/02/22   Larae Grooms, NP    Family History Family History  Problem Relation Age of Onset   Hypertension Mother    Autoimmune disease Mother    Stroke Father    Hypertension Father    Heart attack Father    Aneurysm Father        brain   Heart disease Father    Cancer Maternal Aunt        breast   Cancer Maternal Grandmother        breast   Diabetes Maternal Grandmother    Stroke Maternal Grandfather    Diabetes Paternal Grandmother    Heart disease Paternal Grandmother    Migraines Neg Hx     Social History Social History   Tobacco Use   Smoking status: Some Days    Current  packs/day: 0.25    Average packs/day: 0.3 packs/day for 17.0 years (4.3 ttl pk-yrs)    Types: Cigarettes   Smokeless tobacco: Never   Tobacco comments:    Pt vapes everyday  Vaping Use   Vaping status: Some Days  Substance Use Topics   Alcohol use: Not Currently    Comment: occass   Drug use: No     Allergies   Tramadol   Review of Systems Review of Systems   Physical Exam Triage Vital Signs ED Triage Vitals [07/26/23 1514]  Encounter Vitals Group     BP 117/78     Systolic BP Percentile      Diastolic BP Percentile      Pulse Rate 80     Resp 17     Temp 98.7 F (37.1 C)     Temp Source Oral     SpO2 98 %     Weight      Height      Head Circumference      Peak Flow      Pain Score      Pain Loc      Pain Education      Exclude from Growth Chart    No data found.  Updated Vital Signs BP 117/78 (BP Location: Left Arm)   Pulse 80   Temp 98.7 F (37.1 C) (Oral)   Resp 17   SpO2 98%   Visual Acuity Right Eye Distance:   Left Eye Distance:   Bilateral Distance:    Right Eye Near:   Left Eye Near:    Bilateral Near:     Physical Exam Constitutional:      Appearance: Normal appearance.  Eyes:     Extraocular Movements: Extraocular movements intact.  Pulmonary:     Effort: Pulmonary effort is normal.  Abdominal:     General: Abdomen is flat. Bowel sounds are normal. There is no distension.     Palpations: Abdomen is soft.     Tenderness: There is no abdominal tenderness. There is no right CVA tenderness, left CVA tenderness or guarding.  Neurological:     Mental Status: She is alert and oriented to person, place, and time. Mental status is at baseline.      UC Treatments / Results  Labs (all labs ordered are listed, but only abnormal results are displayed) Labs Reviewed  POCT URINALYSIS DIP (MANUAL ENTRY) -  Abnormal; Notable for the following components:      Result Value   Color, UA other (*)    Clarity, UA cloudy (*)    Ketones, POC  UA trace (5) (*)    Spec Grav, UA >=1.030 (*)    Blood, UA large (*)    Protein Ur, POC =100 (*)    Leukocytes, UA Small (1+) (*)    All other components within normal limits  URINE CULTURE    EKG   Radiology No results found.  Procedures Procedures (including critical care time)  Medications Ordered in UC Medications - No data to display  Initial Impression / Assessment and Plan / UC Course  I have reviewed the triage vital signs and the nursing notes.  Pertinent labs & imaging results that were available during my care of the patient were reviewed by me and considered in my medical decision making (see chart for details).  Dysuria  Urinalysis showing leukocytes but negative for nitrates, sent for culture, discussed findings prophylactically initiating treatment as she is symptomatic, Macrobid prescribed as well as Zofran and Pyridium for supportive care recommended additional nonpharmacological management and advised follow-up as needed Final Clinical Impressions(s) / UC Diagnoses   Final diagnoses:  Dysuria     Discharge Instructions      Your urinalysis does  not show bacteria currently, your urine will be sent to the lab to determine exactly which bacteria is present, if any changes need to be made to your medications you will be notified  Begin use of macrobid every morning and every evening for 5 days  May Use Zofran every 8 hours to help with nausea and vomiting placed under tongue and allow to dissolve  You may use Pyridium every 8 hours to help minimize your symptoms until antibiotic removes bacteria, this medication will turn your urine orange  Increase your fluid intake through use of water  As always practice good hygiene, wiping front to back and avoidance of scented vaginal products to prevent further irritation  If symptoms continue to persist after use of medication or recur please follow-up with urgent care or your primary doctor as  needed    ED Prescriptions     Medication Sig Dispense Auth. Provider   nitrofurantoin, macrocrystal-monohydrate, (MACROBID) 100 MG capsule  (Status: Discontinued) Take 1 capsule (100 mg total) by mouth 2 (two) times daily. 10 capsule Herrick Hartog R, NP   ondansetron (ZOFRAN-ODT) 4 MG disintegrating tablet  (Status: Discontinued) Take 1 tablet (4 mg total) by mouth every 8 (eight) hours as needed for nausea or vomiting. 20 tablet Kriti Katayama R, NP   phenazopyridine (PYRIDIUM) 200 MG tablet  (Status: Discontinued) Take 1 tablet (200 mg total) by mouth 3 (three) times daily. 6 tablet Levina Boyack R, NP   nitrofurantoin, macrocrystal-monohydrate, (MACROBID) 100 MG capsule Take 1 capsule (100 mg total) by mouth 2 (two) times daily. 10 capsule Orvan Papadakis R, NP   ondansetron (ZOFRAN-ODT) 4 MG disintegrating tablet Take 1 tablet (4 mg total) by mouth every 8 (eight) hours as needed for nausea or vomiting. 20 tablet Aadvik Roker R, NP   phenazopyridine (PYRIDIUM) 200 MG tablet Take 1 tablet (200 mg total) by mouth 3 (three) times daily. 6 tablet Valinda Hoar, NP      PDMP not reviewed this encounter.   Valinda Hoar, NP 07/26/23 [phone]

## 2023-07-26 NOTE — Telephone Encounter (Signed)
virtual urgent care visit appt scheduled

## 2023-07-27 ENCOUNTER — Telehealth: Payer: 59

## 2023-07-29 LAB — URINE CULTURE: Culture: 60000 — AB

## 2023-08-05 NOTE — Progress Notes (Signed)
 Janet Pao, NP   Chief Complaint  Patient presents with   Vaginal Itching    Irritation, sour odor, no discharge x 2 weeks   Urinary Tract Infection    Frequency, bladder feels tender x 8 days    HPI:      [name] is a 38 y.o. [id number] whose LMP was Patient's last menstrual period was 07/11/2023 (approximate)., presents today for UTI sx of urgency/frequency/nocturia and pelvic tenderness, no fevers. Drinking caffeine and water. Bladder feels tender with sex. No dysuria, improved LBP. Went to [facility] 07/26/23 for inability to urinate, dysuria, LBP. 12/24 with E. Coli on C&S, treated and susceptible to macrobid  with some improvement but sx have persisted.  Pt now with vaginal itching, no increased d/c/fishy odor. No meds to treat. No new partners. On OCPs.  Patient Active Problem List   Diagnosis Date Noted   Depression, recurrent (HCC) 06/01/2022   Intractable chronic migraine without aura and with status migrainosus 01/21/2022   Chronic nonintractable headache 12/25/2021   Anxiety 12/11/2021   Acute blood loss anemia 06/30/2021   Hypoglycemia 02/08/2015   GERD (gastroesophageal reflux disease)    Hypertension    Hypothyroidism    Tobacco use    Migraines     Past Surgical History:  Procedure Laterality Date   BELPHAROPTOSIS REPAIR     eye lid lift   DILATION AND CURETTAGE OF UTERUS     x 2   DILATION AND EVACUATION N/A 06/30/2021   Procedure: DILATATION AND EVACUATION;  Surgeon: Janit Alm Agent, MD;  Location: [facility] OR[facility];  Service: Gynecology;  Laterality: N/A;   LEEP     x 2. last paps 2011, 2012, 2013 were normal    Family History  Problem Relation Age of Onset   Hypertension Mother    Autoimmune disease Mother    Stroke Father    Hypertension Father    Heart attack Father    Aneurysm Father        brain   Heart disease Father    Breast cancer Maternal Aunt        30s   Breast cancer Maternal Grandmother        30s   Diabetes Maternal  Grandmother    Stroke Maternal Grandfather    Diabetes Paternal Grandmother    Heart disease Paternal Grandmother    Migraines Neg Hx     Social History   Socioeconomic History   Marital status: Single    Spouse name: Not on file   Number of children: Not on file   Years of education: Not on file   Highest education level: Not on file  Occupational History   Not on file  Tobacco Use   Smoking status: Every Day    Current packs/day: 0.25    Average packs/day: 0.3 packs/day for 17.0 years (4.3 ttl pk-yrs)    Types: Cigarettes   Smokeless tobacco: Never   Tobacco comments:    Pt vapes everyday  Vaping Use   Vaping status: Every Day  Substance and Sexual Activity   Alcohol use: Yes    Comment: occass   Drug use: No   Sexual activity: Yes    Birth control/protection: Pill  Other Topics Concern   Not on file  Social History Narrative   Not on file   Social Drivers of Health   Financial Resource Strain: Low Risk  (12/20/2018)   Overall Financial Resource Strain (CARDIA)    Difficulty of Paying Living  Expenses: Not hard at all  Food Insecurity: No Food Insecurity (12/20/2018)   Hunger Vital Sign    Worried About Running Out of Food in the Last Year: Never true    Ran Out of Food in the Last Year: Never true  Transportation Needs: No Transportation Needs (12/20/2018)   PRAPARE - Administrator, Civil Service (Medical): No    Lack of Transportation (Non-Medical): No  Physical Activity: Not on file  Stress: Not on file  Social Connections: Not on file  Intimate Partner Violence: Not At Risk (12/20/2018)   Humiliation, Afraid, Rape, and Kick questionnaire    Fear of Current or Ex-Partner: No    Emotionally Abused: No    Physically Abused: No    Sexually Abused: No    Outpatient Medications Prior to Visit  Medication Sig Dispense Refill   norethindrone -ethinyl estradiol -FE (JUNEL FE 1/20) 1-20 MG-MCG tablet Take 1 tablet by mouth daily. 28 tablet 11    busPIRone  (BUSPAR ) 15 MG tablet Take 1 tablet (15 mg total) by mouth 2 (two) times daily. (Patient not taking: Reported on 08/06/2023) 180 tablet 1   Galcanezumab -gnlm (EMGALITY ) 120 MG/ML SOAJ Inject 120 mg into the skin every 30 (thirty) days. (Patient not taking: Reported on 08/06/2023) 1.12 mL 11   QUEtiapine  (SEROQUEL ) 50 MG tablet Take 1.5 tablets (75 mg total) by mouth at bedtime. (Patient not taking: Reported on 08/06/2023) 135 tablet 1   Rimegepant Sulfate (NURTEC) 75 MG TBDP Take 75 mg by mouth daily. For migraines. Take as close to onset of migraine as possible. One daily maximum. (Patient not taking: Reported on 08/06/2023) 8 tablet 11   rizatriptan  (MAXALT -MLT) 10 MG disintegrating tablet Take 10 mg by mouth as needed for migraine. (Patient not taking: Reported on 08/06/2023)     sertraline  (ZOLOFT ) 100 MG tablet Take 2 tablets (200 mg total) by mouth daily. (Patient not taking: Reported on 08/06/2023) 180 tablet 1   nitrofurantoin , macrocrystal-monohydrate, (MACROBID ) 100 MG capsule Take 1 capsule (100 mg total) by mouth 2 (two) times daily. 10 capsule 0   ondansetron  (ZOFRAN -ODT) 4 MG disintegrating tablet Take 1 tablet (4 mg total) by mouth every 8 (eight) hours as needed for nausea or vomiting. 20 tablet 0   phenazopyridine  (PYRIDIUM ) 200 MG tablet Take 1 tablet (200 mg total) by mouth 3 (three) times daily. 6 tablet 0   No facility-administered medications prior to visit.      ROS:  Review of Systems  Constitutional:  Negative for fever.  Gastrointestinal:  Negative for blood in stool, constipation, diarrhea, nausea and vomiting.  Genitourinary:  Positive for dyspareunia, frequency and urgency. Negative for dysuria, flank pain, hematuria, vaginal bleeding, vaginal discharge and vaginal pain.  Musculoskeletal:  Negative for back pain.  Skin:  Negative for rash.   BREAST: No symptoms   OBJECTIVE:   Vitals:  BP 114/73   Pulse 76   Ht 5' 6 (1.676 m)   Wt 201 lb (91.2 kg)    LMP 07/11/2023 (Approximate)   BMI 32.44 kg/m   Physical Exam Vitals reviewed.  Constitutional:      Appearance: She is well-developed.  Pulmonary:     Effort: Pulmonary effort is normal.  Abdominal:     Tenderness: There is abdominal tenderness in the suprapubic area. There is no right CVA tenderness, left CVA tenderness, guarding or rebound.  Genitourinary:    General: Normal vulva.     Pubic Area: No rash.      Labia:  Right: No rash, tenderness or lesion.        Left: No rash, tenderness or lesion.      Vagina: Vaginal discharge present. No erythema or tenderness.     Cervix: Normal.     Uterus: Normal. Tender. Not enlarged.      Adnexa: Right adnexa normal and left adnexa normal.       Right: No mass or tenderness.         Left: No mass or tenderness.    Musculoskeletal:        General: Normal range of motion.     Cervical back: Normal range of motion.  Skin:    General: Skin is warm and dry.  Neurological:     General: No focal deficit present.     Mental Status: She is alert and oriented to person, place, and time.  Psychiatric:        Mood and Affect: Mood normal.        Behavior: Behavior normal.        Thought Content: Thought content normal.        Judgment: Judgment normal.     Results: Results for orders placed or performed in visit on 08/06/23 (from the past 24 hours)  POCT urinalysis dipstick     Status: Normal   Collection Time: 08/06/23  9:51 AM  Result Value Ref Range   Color, UA yellow    Clarity, UA clear    Glucose, UA Negative Negative   Bilirubin, UA neg    Ketones, UA neg    Spec Grav, UA 1.010 1.010 - 1.025   Blood, UA neg    pH, UA 5.0 5.0 - 8.0   Protein, UA Negative Negative   Urobilinogen, UA     Nitrite, UA neg    Leukocytes, UA Negative Negative   Appearance     Odor    POCT Wet Prep with KOH     Status: Normal   Collection Time: 08/06/23  9:52 AM  Result Value Ref Range   Trichomonas, UA Negative    Clue Cells Wet  Prep HPF POC neg    Epithelial Wet Prep HPF POC     Yeast Wet Prep HPF POC neg    Bacteria Wet Prep HPF POC     RBC Wet Prep HPF POC     WBC Wet Prep HPF POC     KOH Prep POC Negative Negative     Assessment/Plan: UTI symptoms - Plan: sulfamethoxazole -trimethoprim  (BACTRIM  DS) 800-160 MG tablet, phenazopyridine  (PYRIDIUM ) 200 MG tablet, Urine Culture, POCT urinalysis dipstick; pos sx, tender on exam. Rx bactrim  and pyridium , check C&S. If neg, most likely bladder spasm. D/C caffeine.   Vaginal itching - Plan: POCT Wet Prep with KOH, fluconazole  (DIFLUCAN ) 150 MG tablet; neg exam, neg wet prep. Treat empirically with diflucan . F/u prn.   Meds ordered this encounter  Medications   sulfamethoxazole -trimethoprim  (BACTRIM  DS) 800-160 MG tablet    Sig: Take 1 tablet by mouth 2 (two) times daily for 7 days.    Dispense:  14 tablet    Refill:  0    Supervising Provider:   CHERRY, ANIKA [[id number]]   phenazopyridine  (PYRIDIUM ) 200 MG tablet    Sig: Take 1 tablet (200 mg total) by mouth 3 (three) times daily as needed for pain (urethral spasm).    Dispense:  10 tablet    Refill:  0    Supervising Provider:   CHERRY, ANIKA [[id number]]   fluconazole  (DIFLUCAN )  150 MG tablet    Sig: Take 1 tablet (150 mg total) by mouth once for 1 dose.    Dispense:  1 tablet    Refill:  0    Supervising Provider:   CHERRY, ANIKA [[id number]]      Return if symptoms worsen or fail to improve.  Ioannis Schuh B. Roxie Gueye, PA-C 08/06/2023 9:57 AM

## 2023-08-06 ENCOUNTER — Encounter: Payer: Self-pay | Admitting: Obstetrics and Gynecology

## 2023-08-06 ENCOUNTER — Ambulatory Visit (INDEPENDENT_AMBULATORY_CARE_PROVIDER_SITE_OTHER): Payer: Medicaid Other | Admitting: Obstetrics and Gynecology

## 2023-08-06 VITALS — BP 114/73 | HR 76 | Ht 66.0 in | Wt 201.0 lb

## 2023-08-06 DIAGNOSIS — R399 Unspecified symptoms and signs involving the genitourinary system: Secondary | ICD-10-CM | POA: Diagnosis not present

## 2023-08-06 DIAGNOSIS — N898 Other specified noninflammatory disorders of vagina: Secondary | ICD-10-CM | POA: Diagnosis not present

## 2023-08-06 LAB — POCT URINALYSIS DIPSTICK
Bilirubin, UA: NEGATIVE
Blood, UA: NEGATIVE
Glucose, UA: NEGATIVE
Ketones, UA: NEGATIVE
Leukocytes, UA: NEGATIVE
Nitrite, UA: NEGATIVE
Protein, UA: NEGATIVE
Spec Grav, UA: 1.01 (ref 1.010–1.025)
pH, UA: 5 (ref 5.0–8.0)

## 2023-08-06 LAB — POCT WET PREP WITH KOH
Clue Cells Wet Prep HPF POC: NEGATIVE
KOH Prep POC: NEGATIVE
Trichomonas, UA: NEGATIVE
Yeast Wet Prep HPF POC: NEGATIVE

## 2023-08-06 MED ORDER — PHENAZOPYRIDINE HCL 200 MG PO TABS: 200.0000 mg | ORAL_TABLET | Freq: Three times a day (TID) | ORAL | 0 refills | Status: DC | PRN

## 2023-08-06 MED ORDER — SULFAMETHOXAZOLE-TRIMETHOPRIM 800-160 MG PO TABS: 1.0000 | ORAL_TABLET | Freq: Two times a day (BID) | ORAL | 0 refills | Status: AC

## 2023-08-06 MED ORDER — FLUCONAZOLE 150 MG PO TABS: 150.0000 mg | ORAL_TABLET | Freq: Once | ORAL | 0 refills | Status: AC

## 2023-08-06 NOTE — Patient Instructions (Signed)
 I value your feedback and you entrusting [place] with your care. If you get a [place] patient survey, I would appreciate you taking the time to let us know about your experience today. Thank you! ? ? ?

## 2023-08-09 LAB — URINE CULTURE

## 2023-08-12 ENCOUNTER — Ambulatory Visit (INDEPENDENT_AMBULATORY_CARE_PROVIDER_SITE_OTHER): Payer: Medicaid Other | Admitting: Nurse Practitioner

## 2023-08-12 ENCOUNTER — Encounter: Payer: Self-pay | Admitting: Nurse Practitioner

## 2023-08-12 VITALS — BP 122/78 | HR 83 | Temp 97.5°F | Ht 66.0 in | Wt 200.6 lb

## 2023-08-12 DIAGNOSIS — M7989 Other specified soft tissue disorders: Secondary | ICD-10-CM

## 2023-08-12 DIAGNOSIS — Z113 Encounter for screening for infections with a predominantly sexual mode of transmission: Secondary | ICD-10-CM | POA: Diagnosis not present

## 2023-08-12 NOTE — Progress Notes (Signed)
BP 122/78 (BP Location: Right Arm, Patient Position: Sitting, Cuff Size: Large)   Pulse 83   Temp (!) 97.5 F (36.4 C) (Oral)   Ht 5\' 6"  (1.676 m)   Wt 200 lb 9.6 oz (91 kg)   LMP 07/11/2023 (Approximate)   SpO2 98%   BMI 32.38 kg/m    Subjective:    Patient ID: Janet Coleman, female    DOB: 22-Feb-1986, 38 y.o.   MRN: [id number]  HPI: [name] is a 38 y.o. female  Chief Complaint  Patient presents with   Labs Only   Leg Swelling    Has been a problem for 6 -7 weeks, works 3rd shift, hypoglycemic     Patient was seen last week for a UTI and treated with antibiotics.  Was still having symptoms and urine was rechecked but no growth on the culture.  Patient is requesting STI testing due to feeling some irritation with urination.  No other symptoms of STI.  Patient is having some lower leg swelling.  Drinks very little water.  Resolves at bedtime.  Not wearing compression socks.   Denies HA, CP, SOB, dizziness, palpitations, visual changes.    Relevant past medical, surgical, family and social history reviewed and updated as indicated. Interim medical history since our last visit reviewed. Allergies and medications reviewed and updated.  Review of Systems  Constitutional: Negative.   Eyes:  Negative for visual disturbance.  Respiratory: Negative.  Negative for cough, chest tightness and shortness of breath.   Cardiovascular:  Positive for leg swelling. Negative for chest pain and palpitations.  Genitourinary:  Positive for dysuria.  Musculoskeletal: Negative.   Neurological:  Negative for dizziness and headaches.  Psychiatric/Behavioral:  Positive for dysphoric mood. Negative for suicidal ideas. The patient is nervous/anxious.     Per HPI unless specifically indicated above     Objective:    BP 122/78 (BP Location: Right Arm, Patient Position: Sitting, Cuff Size: Large)   Pulse 83   Temp (!) 97.5 F (36.4 C) (Oral)   Ht 5\' 6"  (1.676 m)   Wt 200 lb 9.6  oz (91 kg)   LMP 07/11/2023 (Approximate)   SpO2 98%   BMI 32.38 kg/m   Wt Readings from Last 3 Encounters:  08/12/23 200 lb 9.6 oz (91 kg)  08/06/23 201 lb (91.2 kg)  09/02/22 190 lb 8 oz (86.4 kg)    Physical Exam Vitals and nursing note reviewed.  Constitutional:      General: She is not in acute distress.    Appearance: Normal appearance. She is normal weight. She is not ill-appearing, toxic-appearing or diaphoretic.  HENT:     Head: Normocephalic.     Right Ear: Hearing and external ear normal.     Left Ear: Hearing and external ear normal.     Nose: Nose normal.     Mouth/Throat:     Mouth: Mucous membranes are moist.     Pharynx: Oropharynx is clear.  Eyes:     General:        Right eye: No discharge.        Left eye: No discharge.     Extraocular Movements: Extraocular movements intact.     Conjunctiva/sclera: Conjunctivae normal.     Pupils: Pupils are equal, round, and reactive to light.  Cardiovascular:     Rate and Rhythm: Normal rate and regular rhythm.     Heart sounds: No murmur heard. Pulmonary:     Effort: Pulmonary effort  is normal. No respiratory distress.     Breath sounds: Normal breath sounds. No wheezing or rales.  Musculoskeletal:     Cervical back: Normal range of motion and neck supple.     Right lower leg: 1+ Edema present.     Left lower leg: 1+ Edema present.  Skin:    General: Skin is warm and dry.     Capillary Refill: Capillary refill takes less than 2 seconds.  Neurological:     General: No focal deficit present.     Mental Status: She is alert and oriented to person, place, and time. Mental status is at baseline.  Psychiatric:        Mood and Affect: Mood normal.        Behavior: Behavior normal.        Thought Content: Thought content normal.        Judgment: Judgment normal.     Results for orders placed or performed in visit on 08/06/23  POCT urinalysis dipstick   Collection Time: 08/06/23  9:51 AM  Result Value Ref Range    Color, UA yellow    Clarity, UA clear    Glucose, UA Negative Negative   Bilirubin, UA neg    Ketones, UA neg    Spec Grav, UA 1.010 1.010 - 1.025   Blood, UA neg    pH, UA 5.0 5.0 - 8.0   Protein, UA Negative Negative   Urobilinogen, UA     Nitrite, UA neg    Leukocytes, UA Negative Negative   Appearance     Odor    POCT Wet Prep with KOH   Collection Time: 08/06/23  9:52 AM  Result Value Ref Range   Trichomonas, UA Negative    Clue Cells Wet Prep HPF POC neg    Epithelial Wet Prep HPF POC     Yeast Wet Prep HPF POC neg    Bacteria Wet Prep HPF POC     RBC Wet Prep HPF POC     WBC Wet Prep HPF POC     KOH Prep POC Negative Negative  Urine Culture   Collection Time: 08/06/23 10:02 AM   Specimen: Urine, Clean Catch   UR  Result Value Ref Range   Urine Culture, Routine Final report    Organism ID, Bacteria Comment       Assessment & Plan:   Problem List Items Addressed This Visit   None Visit Diagnoses       Swelling of lower extremity    -  Primary   No red flags on exam. Increase water intake, elevate legs, wear compression socks. Follow up in 1 month.     Screening examination for STI       Labs ordered for evaluation at visit today.  Will make recommendations based on results.   Relevant Orders   HIV Antibody (routine testing w rflx)   RPR   Hepatitis C antibody   Chlamydia/Gonococcus/Trichomonas, NAA         Follow up plan: Return in about 1 month (around 09/12/2023) for Physical and Fasting labs.    This visit was completed via MyChart due to the restrictions of the COVID-19 pandemic. All issues as above were discussed and addressed. Physical exam was done as above through visual confirmation on MyChart. If it was felt that the patient should be evaluated in the office, they were directed there. The patient verbally consented to this visit. Location of the patient: Home Location of the  provider: Office Those involved with this call:  Provider:  Larae Grooms, NP CMA: Wilhemena Durie, CMA Front Desk/Registration: Servando Snare This encounter was conducted via video.  I spent 20 dedicated to the care of this patient on the date of this encounter to include previsit review of medications, symptoms, plan of care, follow up, face to face time with the patient, and post visit ordering of testing.

## 2023-08-13 LAB — RPR: RPR Ser Ql: NONREACTIVE

## 2023-08-13 LAB — HIV ANTIBODY (ROUTINE TESTING W REFLEX): HIV Screen 4th Generation wRfx: NONREACTIVE

## 2023-08-13 LAB — HEPATITIS C ANTIBODY: Hep C Virus Ab: NONREACTIVE

## 2023-08-15 LAB — CHLAMYDIA/GONOCOCCUS/TRICHOMONAS, NAA
Chlamydia by NAA: NEGATIVE
Gonococcus by NAA: NEGATIVE
Trich vag by NAA: NEGATIVE

## 2023-08-16 ENCOUNTER — Encounter: Payer: Self-pay | Admitting: Nurse Practitioner

## 2023-08-26 ENCOUNTER — Ambulatory Visit: Payer: Medicaid Other | Admitting: Obstetrics

## 2023-08-26 DIAGNOSIS — Z0000 Encounter for general adult medical examination without abnormal findings: Secondary | ICD-10-CM

## 2023-08-27 ENCOUNTER — Encounter: Payer: Self-pay | Admitting: Obstetrics

## 2023-09-21 ENCOUNTER — Ambulatory Visit (INDEPENDENT_AMBULATORY_CARE_PROVIDER_SITE_OTHER): Payer: Medicaid Other | Admitting: Nurse Practitioner

## 2023-09-21 ENCOUNTER — Encounter: Payer: Self-pay | Admitting: Nurse Practitioner

## 2023-09-21 VITALS — BP 120/82 | HR 65 | Ht 66.0 in | Wt 199.0 lb

## 2023-09-21 DIAGNOSIS — Z0000 Encounter for general adult medical examination without abnormal findings: Secondary | ICD-10-CM

## 2023-09-21 DIAGNOSIS — Z308 Encounter for other contraceptive management: Secondary | ICD-10-CM | POA: Diagnosis not present

## 2023-09-21 DIAGNOSIS — Z136 Encounter for screening for cardiovascular disorders: Secondary | ICD-10-CM | POA: Diagnosis not present

## 2023-09-21 LAB — URINALYSIS, ROUTINE W REFLEX MICROSCOPIC
Bilirubin, UA: NEGATIVE
Glucose, UA: NEGATIVE
Ketones, UA: NEGATIVE
Leukocytes,UA: NEGATIVE
Nitrite, UA: NEGATIVE
Protein,UA: NEGATIVE
Specific Gravity, UA: 1.02 (ref 1.005–1.030)
Urobilinogen, Ur: 0.2 mg/dL (ref 0.2–1.0)
pH, UA: 7 (ref 5.0–7.5)

## 2023-09-21 LAB — MICROSCOPIC EXAMINATION: WBC, UA: NONE SEEN /HPF (ref 0–5)

## 2023-09-21 MED ORDER — ETONOGESTREL-ETHINYL ESTRADIOL 0.12-0.015 MG/24HR VA RING: VAGINAL_RING | VAGINAL | 12 refills | Status: AC

## 2023-09-21 NOTE — Progress Notes (Signed)
 BP 120/82 (BP Location: Left Arm, Patient Position: Sitting, Cuff Size: Large)   Pulse 65   Ht 5\' 6"  (1.676 m)   Wt 199 lb (90.3 kg)   LMP 08/28/2023   SpO2 97%   BMI 32.12 kg/m    Subjective:    Patient ID: Janet Coleman, female    DOB: 10/16/1985, 38 y.o.   MRN: [id number]  HPI: Janet Coleman is a 38 y.o. female presenting on 09/21/2023 for comprehensive medical examination. Current medical complaints include:none  She currently lives with: Menopausal Symptoms: no  Patient states she has been off her medication for several months.  She doesn't want them taken off her list in care she decides to go back on them.    Depression Screen done today and results listed below:     09/21/2023    9:27 AM 08/12/2023   10:26 AM 11/04/2022    2:49 PM 09/02/2022    3:46 PM 12/25/2021   11:32 AM  Depression screen PHQ 2/9  Decreased Interest 1 1 1 1 2   Down, Depressed, Hopeless 1 1 2 1 3   PHQ - 2 Score 2 2 3 2 5   Altered sleeping 0 1 1 1 2   Tired, decreased energy 1 1 2 1 2   Change in appetite 0 1 1 1 2   Feeling bad or failure about yourself  0 0 1 1 0  Trouble concentrating 1 0 1 1 0  Moving slowly or fidgety/restless 0 0 1 0 1  Suicidal thoughts 0 0 0 0 0  PHQ-9 Score 4 5 10 7 12   Difficult doing work/chores Somewhat difficult  Somewhat difficult Somewhat difficult Very difficult    The patient does not have a history of falls. I did complete a risk assessment for falls. A plan of care for falls was documented.   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   Migraines    Reactive hypoglycemia    Reactive hypoglycemia    Tobacco use    Vaginal Pap smear, abnormal     Surgical History:  Past Surgical History:  Procedure Laterality Date   BELPHAROPTOSIS REPAIR     eye lid lift   DILATION  AND CURETTAGE OF UTERUS     x 2   DILATION AND EVACUATION N/A 06/30/2021   Procedure: DILATATION AND EVACUATION;  Surgeon: Linzie Collin, MD;  Location: [facility] OR[facility];  Service: Gynecology;  Laterality: N/A;   LEEP     x 2. last paps 2011, 2012, 2013 were normal    Medications:  Current Outpatient Medications on File Prior to Visit  Medication Sig   busPIRone (BUSPAR) 15 MG tablet Take 1 tablet (15 mg total) by mouth 2 (two) times daily. (Patient not taking: Reported on 08/06/2023)   Galcanezumab-gnlm (EMGALITY) 120 MG/ML SOAJ Inject 120 mg into the skin every 30 (thirty) days. (Patient not taking: Reported on 08/06/2023)   phenazopyridine (PYRIDIUM) 200 MG tablet Take 1 tablet (200 mg total) by mouth 3 (three) times daily as needed for pain (urethral spasm). (Patient not taking: Reported on 09/21/2023)   QUEtiapine (SEROQUEL) 50 MG tablet Take 1.5 tablets (75 mg total) by mouth at bedtime. (Patient not taking: Reported on 08/06/2023)   Rimegepant Sulfate (NURTEC) 75 MG TBDP Take 75 mg by mouth daily. For  migraines. Take as close to onset of migraine as possible. One daily maximum. (Patient not taking: Reported on 08/06/2023)   rizatriptan (MAXALT-MLT) 10 MG disintegrating tablet Take 10 mg by mouth as needed for migraine. (Patient not taking: Reported on 08/06/2023)   sertraline (ZOLOFT) 100 MG tablet Take 2 tablets (200 mg total) by mouth daily. (Patient not taking: Reported on 08/06/2023)   No current facility-administered medications on file prior to visit.    Allergies:  Allergies  Allergen Reactions   Tramadol Swelling    Social History:  Social History   Socioeconomic History   Marital status: Single    Spouse name: Not on file   Number of children: Not on file   Years of education: Not on file   Highest education level: Not on file  Occupational History   Not on file  Tobacco Use   Smoking status: Every Day    Current packs/day: 0.25    Average packs/day: 0.3 packs/day for  17.0 years (4.3 ttl pk-yrs)    Types: Cigarettes   Smokeless tobacco: Never   Tobacco comments:    Pt vapes everyday  Vaping Use   Vaping status: Every Day  Substance and Sexual Activity   Alcohol use: Yes    Comment: occass   Drug use: No   Sexual activity: Yes    Birth control/protection: Pill  Other Topics Concern   Not on file  Social History Narrative   Not on file   Social Drivers of Health   Financial Resource Strain: Low Risk  (12/20/2018)   Overall Financial Resource Strain (CARDIA)    Difficulty of Paying Living Expenses: Not hard at all  Food Insecurity: No Food Insecurity (12/20/2018)   Hunger Vital Sign    Worried About Running Out of Food in the Last Year: Never true    Ran Out of Food in the Last Year: Never true  Transportation Needs: No Transportation Needs (12/20/2018)   PRAPARE - Administrator, Civil Service (Medical): No    Lack of Transportation (Non-Medical): No  Physical Activity: Not on file  Stress: Not on file  Social Connections: Not on file  Intimate Partner Violence: Not At Risk (12/20/2018)   Humiliation, Afraid, Rape, and Kick questionnaire    Fear of Current or Ex-Partner: No    Emotionally Abused: No    Physically Abused: No    Sexually Abused: No   Social History   Tobacco Use  Smoking Status Every Day   Current packs/day: 0.25   Average packs/day: 0.3 packs/day for 17.0 years (4.3 ttl pk-yrs)   Types: Cigarettes  Smokeless Tobacco Never  Tobacco Comments   Pt vapes everyday   Social History   Substance and Sexual Activity  Alcohol Use Yes   Comment: occass    Family History:  Family History  Problem Relation Age of Onset   Hypertension Mother    Autoimmune disease Mother    Stroke Father    Hypertension Father    Heart attack Father    Aneurysm Father        brain   Heart disease Father    Breast cancer Maternal Aunt        30s   Breast cancer Maternal Grandmother        30s   Diabetes Maternal  Grandmother    Stroke Maternal Grandfather    Diabetes Paternal Grandmother    Heart disease Paternal Grandmother    Migraines Neg Hx  Past medical history, surgical history, medications, allergies, family history and social history reviewed with patient today and changes made to appropriate areas of the chart.   Review of Systems  All other systems reviewed and are negative.  All other ROS negative except what is listed above and in the HPI.      Objective:    BP 120/82 (BP Location: Left Arm, Patient Position: Sitting, Cuff Size: Large)   Pulse 65   Ht 5\' 6"  (1.676 m)   Wt 199 lb (90.3 kg)   LMP 08/28/2023   SpO2 97%   BMI 32.12 kg/m   Wt Readings from Last 3 Encounters:  09/21/23 199 lb (90.3 kg)  08/12/23 200 lb 9.6 oz (91 kg)  08/06/23 201 lb (91.2 kg)    Physical Exam Vitals and nursing note reviewed.  Constitutional:      General: She is awake. She is not in acute distress.    Appearance: Normal appearance. She is well-developed. She is not ill-appearing.  HENT:     Head: Normocephalic and atraumatic.     Right Ear: Hearing, tympanic membrane, ear canal and external ear normal. No drainage.     Left Ear: Hearing, tympanic membrane, ear canal and external ear normal. No drainage.     Nose: Nose normal.     Right Sinus: No maxillary sinus tenderness or frontal sinus tenderness.     Left Sinus: No maxillary sinus tenderness or frontal sinus tenderness.     Mouth/Throat:     Mouth: Mucous membranes are moist.     Pharynx: Oropharynx is clear. Uvula midline. No pharyngeal swelling, oropharyngeal exudate or posterior oropharyngeal erythema.  Eyes:     General: Lids are normal.        Right eye: No discharge.        Left eye: No discharge.     Extraocular Movements: Extraocular movements intact.     Conjunctiva/sclera: Conjunctivae normal.     Pupils: Pupils are equal, round, and reactive to light.     Visual Fields: Right eye visual fields normal and left eye  visual fields normal.  Neck:     Thyroid: No thyromegaly.     Vascular: No carotid bruit.     Trachea: Trachea normal.  Cardiovascular:     Rate and Rhythm: Normal rate and regular rhythm.     Heart sounds: Normal heart sounds. No murmur heard.    No gallop.  Pulmonary:     Effort: Pulmonary effort is normal. No accessory muscle usage or respiratory distress.     Breath sounds: Normal breath sounds.  Chest:  Breasts:    Right: Normal.     Left: Normal.  Abdominal:     General: Bowel sounds are normal.     Palpations: Abdomen is soft. There is no hepatomegaly or splenomegaly.     Tenderness: There is no abdominal tenderness.  Musculoskeletal:        General: Normal range of motion.     Cervical back: Normal range of motion and neck supple.     Right lower leg: No edema.     Left lower leg: No edema.  Lymphadenopathy:     Head:     Right side of head: No submental, submandibular, tonsillar, preauricular or posterior auricular adenopathy.     Left side of head: No submental, submandibular, tonsillar, preauricular or posterior auricular adenopathy.     Cervical: No cervical adenopathy.     Upper Body:     Right  upper body: No supraclavicular, axillary or pectoral adenopathy.     Left upper body: No supraclavicular, axillary or pectoral adenopathy.  Skin:    General: Skin is warm and dry.     Capillary Refill: Capillary refill takes less than 2 seconds.     Findings: No rash.  Neurological:     Mental Status: She is alert and oriented to person, place, and time.     Gait: Gait is intact.  Psychiatric:        Attention and Perception: Attention normal.        Mood and Affect: Mood normal.        Speech: Speech normal.        Behavior: Behavior normal. Behavior is cooperative.        Thought Content: Thought content normal.        Judgment: Judgment normal.     Results for orders placed or performed in visit on 08/12/23  Chlamydia/Gonococcus/Trichomonas, NAA   Collection  Time: 08/12/23 10:53 AM   Specimen: Urine   UR  Result Value Ref Range   Chlamydia by NAA Negative Negative   Gonococcus by NAA Negative Negative   Trich vag by NAA Negative Negative  HIV Antibody (routine testing w rflx)   Collection Time: 08/12/23 10:55 AM  Result Value Ref Range   HIV Screen 4th Generation wRfx Non Reactive Non Reactive  RPR   Collection Time: 08/12/23 10:55 AM  Result Value Ref Range   RPR Ser Ql Non Reactive Non Reactive  Hepatitis C antibody   Collection Time: 08/12/23 10:55 AM  Result Value Ref Range   Hep C Virus Ab Non Reactive Non Reactive      Assessment & Plan:   Problem List Items Addressed This Visit   None Visit Diagnoses       Annual physical exam    -  Primary   Health maintenance reivewe during visit today.  Labs ordered.  Vaccines reviewed.  PAP up to date.   Relevant Orders   CBC with Differential/Platelet   Comprehensive metabolic panel   Lipid panel   TSH   Urinalysis, Routine w reflex microscopic     Screening for ischemic heart disease       Relevant Orders   Lipid panel     Encounter for other contraceptive management       Requested Nuva Ring. Sent for patient during visit today.  Has been on medication in the past.        Follow up plan: Return in about 6 months (around 03/20/2024) for HTN, HLD, DM2 FU.   LABORATORY TESTING:  - Pap smear: up to date  IMMUNIZATIONS:   - Tdap: Tetanus vaccination status reviewed: last tetanus booster within 10 years. - Influenza: Refused - Pneumovax: Refused - Prevnar: Refused - COVID: Not applicable - HPV: Not applicable - Shingrix vaccine: Not applicable  SCREENING: -Mammogram: Not applicable  - Colonoscopy: Not applicable  - Bone Density: Not applicable  -Hearing Test: Not applicable  -Spirometry: Not applicable   PATIENT COUNSELING:   Advised to take 1 mg of folate supplement per day if capable of pregnancy.   Sexuality: Discussed sexually transmitted diseases, partner  selection, use of condoms, avoidance of unintended pregnancy  and contraceptive alternatives.   Advised to avoid cigarette smoking.  I discussed with the patient that most people either abstain from alcohol or drink within safe limits (<=14/week and <=4 drinks/occasion for males, <=7/weeks and <= 3 drinks/occasion for females) and that  the risk for alcohol disorders and other health effects rises proportionally with the number of drinks per week and how often a drinker exceeds daily limits.  Discussed cessation/primary prevention of drug use and availability of treatment for abuse.   Diet: Encouraged to adjust caloric intake to maintain  or achieve ideal body weight, to reduce intake of dietary saturated fat and total fat, to limit sodium intake by avoiding high sodium foods and not adding table salt, and to maintain adequate dietary potassium and calcium preferably from fresh fruits, vegetables, and low-fat dairy products.    stressed the importance of regular exercise  Injury prevention: Discussed safety belts, safety helmets, smoke detector, smoking near bedding or upholstery.   Dental health: Discussed importance of regular tooth brushing, flossing, and dental visits.    NEXT PREVENTATIVE PHYSICAL DUE IN 1 YEAR. Return in about 6 months (around 03/20/2024) for HTN, HLD, DM2 FU.

## 2023-09-22 LAB — COMPREHENSIVE METABOLIC PANEL WITH GFR
ALT: 18 [IU]/L (ref 0–32)
AST: 13 [IU]/L (ref 0–40)
Albumin: 4.1 g/dL (ref 3.9–4.9)
Alkaline Phosphatase: 109 [IU]/L (ref 44–121)
BUN/Creatinine Ratio: 14 (ref 9–23)
BUN: 12 mg/dL (ref 6–20)
Bilirubin Total: 0.3 mg/dL (ref 0.0–1.2)
CO2: 25 mmol/L (ref 20–29)
Calcium: 9.1 mg/dL (ref 8.7–10.2)
Chloride: 103 mmol/L (ref 96–106)
Creatinine, Ser: 0.83 mg/dL (ref 0.57–1.00)
Globulin, Total: 2.6 g/dL (ref 1.5–4.5)
Glucose: 76 mg/dL (ref 70–99)
Potassium: 4.5 mmol/L (ref 3.5–5.2)
Sodium: 141 mmol/L (ref 134–144)
Total Protein: 6.7 g/dL (ref 6.0–8.5)
eGFR: 92 mL/min/{1.73_m2}

## 2023-09-22 LAB — LIPID PANEL
Chol/HDL Ratio: 3.6 ratio (ref 0.0–4.4)
Cholesterol, Total: 217 mg/dL — ABNORMAL HIGH (ref 100–199)
HDL: 60 mg/dL
LDL Chol Calc (NIH): 140 mg/dL — ABNORMAL HIGH (ref 0–99)
Triglycerides: 96 mg/dL (ref 0–149)
VLDL Cholesterol Cal: 17 mg/dL (ref 5–40)

## 2023-09-22 LAB — CBC WITH DIFFERENTIAL/PLATELET
Basophils Absolute: 0 10*3/uL (ref 0.0–0.2)
Basos: 1 %
EOS (ABSOLUTE): 0.2 10*3/uL (ref 0.0–0.4)
Eos: 2 %
Hematocrit: 44.9 % (ref 34.0–46.6)
Hemoglobin: 14.9 g/dL (ref 11.1–15.9)
Immature Grans (Abs): 0 10*3/uL (ref 0.0–0.1)
Immature Granulocytes: 0 %
Lymphocytes Absolute: 1.9 10*3/uL (ref 0.7–3.1)
Lymphs: 28 %
MCH: 31.6 pg (ref 26.6–33.0)
MCHC: 33.2 g/dL (ref 31.5–35.7)
MCV: 95 fL (ref 79–97)
Monocytes Absolute: 0.3 10*3/uL (ref 0.1–0.9)
Monocytes: 5 %
Neutrophils Absolute: 4.3 10*3/uL (ref 1.4–7.0)
Neutrophils: 64 %
Platelets: 227 10*3/uL (ref 150–450)
RBC: 4.72 x10E6/uL (ref 3.77–5.28)
RDW: 12.2 % (ref 11.7–15.4)
WBC: 6.6 10*3/uL (ref 3.4–10.8)

## 2023-09-22 LAB — TSH: TSH: 3.24 u[IU]/mL (ref 0.450–4.500)

## 2023-09-23 ENCOUNTER — Encounter: Payer: Self-pay | Admitting: Nurse Practitioner

## 2023-11-10 ENCOUNTER — Telehealth: Payer: Self-pay | Admitting: *Deleted

## 2023-12-13 ENCOUNTER — Encounter: Payer: Self-pay | Admitting: Emergency Medicine

## 2023-12-13 ENCOUNTER — Ambulatory Visit
Admission: EM | Admit: 2023-12-13 | Discharge: 2023-12-13 | Disposition: A | Discharge status: Home / Self Care | Attending: Emergency Medicine | Admitting: Emergency Medicine

## 2023-12-13 DIAGNOSIS — R300 Dysuria: Secondary | ICD-10-CM | POA: Insufficient documentation

## 2023-12-13 LAB — POCT URINALYSIS DIP (MANUAL ENTRY)
Glucose, UA: NEGATIVE mg/dL
Ketones, POC UA: NEGATIVE mg/dL
Leukocytes, UA: NEGATIVE
Nitrite, UA: NEGATIVE
Protein Ur, POC: 100 mg/dL — AB
Spec Grav, UA: >=1.03 — AB
Urobilinogen, UA: 0.2 U/dL
pH, UA: 6

## 2023-12-13 MED ORDER — SULFAMETHOXAZOLE-TRIMETHOPRIM 800-160 MG PO TABS: 1.0000 | ORAL_TABLET | Freq: Two times a day (BID) | ORAL | 0 refills | Status: AC

## 2023-12-13 MED ORDER — PHENAZOPYRIDINE HCL 200 MG PO TABS
200.0000 mg | ORAL_TABLET | Freq: Three times a day (TID) | ORAL | 0 refills | Status: DC
Start: 2023-12-13 — End: 2024-01-05

## 2023-12-13 NOTE — Discharge Instructions (Signed)
 Your urinalysis show bacteria at this time, your urine will be sent to the lab to determine exactly which bacteria is present, if any changes need to be made to your medications you will be notified  Begin use of Bactrim  twice daily for 7 days  May use Pyridium  every 8 hours as needed to help with discomfor  Increase your fluid intake through use of water  As always practice good hygiene, wiping front to back and avoidance of scented vaginal products to prevent further irritation  If symptoms continue to persist after use of medication or recur please follow-up with urgent care or your primary doctor as needed

## 2023-12-13 NOTE — ED Provider Notes (Signed)
 Janet Coleman    CSN: [id number] Arrival date & time: 12/13/23  1034      History   Chief Complaint Chief Complaint  Patient presents with   Dysuria    HPI Janet Coleman is a 38 y.o. female.   Patient presents for evaluation of urinary frequency, dysuria and lower abdominal bladder spasming beginning 2 days ago.  Dors is now experiencing lower back pain this morning radiating to the lower abdomen.  Has attempted use of cranberry juice.  Denies hematuria, fever, vaginal symptoms.  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   Migraines    Reactive hypoglycemia    Reactive hypoglycemia    Tobacco use    Vaginal Pap smear, abnormal     Patient Active Problem List   Diagnosis Date Noted   Depression, recurrent (HCC) 06/01/2022   Intractable chronic migraine without aura and with status migrainosus 01/21/2022   Chronic nonintractable headache 12/25/2021   Anxiety 12/11/2021   Acute blood loss anemia 06/30/2021   Hypoglycemia 02/08/2015   GERD (gastroesophageal reflux disease)    Hypertension    Hypothyroidism    Tobacco use    Migraines     Past Surgical History:  Procedure Laterality Date   BELPHAROPTOSIS REPAIR     eye lid lift   DILATION AND CURETTAGE OF UTERUS     x 2   DILATION AND EVACUATION N/A 06/30/2021   Procedure: DILATATION AND EVACUATION;  Surgeon: Zenobia Hila, MD;  Location: [facility];  Service: Gynecology;  Laterality: N/A;   LEEP     x 2. last paps 2011, 2012, 2013 were normal    OB History     Gravida  7   Para  4   Term  4   Preterm      AB  3   Living  4      SAB  3   IAB      Ectopic      Multiple  0   Live Births  4            Home Medications    Prior to Admission medications   Medication Sig Start Date End Date Taking? Authorizing  Provider  phenazopyridine  (PYRIDIUM ) 200 MG tablet Take 1 tablet (200 mg total) by mouth 3 (three) times daily. 12/13/23  Yes Shloma Roggenkamp R, NP  sulfamethoxazole -trimethoprim  (BACTRIM  DS) 800-160 MG tablet Take 1 tablet by mouth 2 (two) times daily for 7 days. 12/13/23 12/20/23 Yes Namiko Pritts R, NP  busPIRone  (BUSPAR ) 15 MG tablet Take 1 tablet (15 mg total) by mouth 2 (two) times daily. Patient not taking: Reported on 08/06/2023 10/01/22   Aileen Alexanders, NP  etonogestrel -ethinyl estradiol  (NUVARING) 0.12-0.015 MG/24HR vaginal ring Insert vaginally and leave in place for 3 consecutive weeks, then remove for 1 week. 09/21/23   Aileen Alexanders, NP  Galcanezumab -gnlm (EMGALITY ) 120 MG/ML SOAJ Inject 120 mg into the skin every 30 (thirty) days. Patient not taking: Reported on 08/06/2023 01/29/22   Glory Larsen, MD  QUEtiapine  (SEROQUEL ) 50 MG tablet Take 1.5 tablets (75 mg total) by mouth at bedtime. Patient not taking: Reported on 08/06/2023 10/01/22   Aileen Alexanders, NP  Rimegepant Sulfate (NURTEC) 75 MG TBDP Take 75 mg by mouth daily. For migraines. Take as close to onset of migraine  as possible. One daily maximum. Patient not taking: Reported on 08/06/2023 05/25/22   Lomax, Amy, NP  rizatriptan  (MAXALT -MLT) 10 MG disintegrating tablet Take 10 mg by mouth as needed for migraine. Patient not taking: Reported on 08/06/2023 05/25/22   [provider]  sertraline  (ZOLOFT ) 100 MG tablet Take 2 tablets (200 mg total) by mouth daily. Patient not taking: Reported on 08/06/2023 09/02/22   Aileen Alexanders, NP    Family History Family History  Problem Relation Age of Onset   Hypertension Mother    Autoimmune disease Mother    Stroke Father    Hypertension Father    Heart attack Father    Aneurysm Father        brain   Heart disease Father    Breast cancer Maternal Aunt        30s   Breast cancer Maternal Grandmother        30s   Diabetes Maternal Grandmother    Stroke Maternal  Grandfather    Diabetes Paternal Grandmother    Heart disease Paternal Grandmother    Migraines Neg Hx     Social History Social History   Tobacco Use   Smoking status: Every Day    Current packs/day: 0.25    Average packs/day: 0.3 packs/day for 17.0 years (4.3 ttl pk-yrs)    Types: Cigarettes   Smokeless tobacco: Never   Tobacco comments:    Pt vapes everyday  Vaping Use   Vaping status: Every Day  Substance Use Topics   Alcohol use: Yes    Comment: occass   Drug use: No     Allergies   Tramadol   Review of Systems Review of Systems   Physical Exam Triage Vital Signs ED Triage Vitals  Encounter Vitals Group     BP 12/13/23 1040 120/83     Systolic BP Percentile --      Diastolic BP Percentile --      Pulse Rate 12/13/23 1040 78     Resp 12/13/23 1040 16     Temp 12/13/23 1040 98.1 F (36.7 C)     Temp Source 12/13/23 1040 Oral     SpO2 12/13/23 1040 95 %     Weight --      Height --      Head Circumference --      Peak Flow --      Pain Score 12/13/23 1038 6     Pain Loc --      Pain Education --      Exclude from Growth Chart --    No data found.  Updated Vital Signs BP 120/83 (BP Location: Left Arm)   Pulse 78   Temp 98.1 F (36.7 C) (Oral)   Resp 16   LMP 11/16/2023 (Approximate)   SpO2 95%   Visual Acuity Right Eye Distance:   Left Eye Distance:   Bilateral Distance:    Right Eye Near:   Left Eye Near:    Bilateral Near:     Physical Exam Constitutional:      Appearance: Normal appearance.  Eyes:     Extraocular Movements: Extraocular movements intact.  Pulmonary:     Effort: Pulmonary effort is normal.  Abdominal:     General: Abdomen is flat. Bowel sounds are normal.     Palpations: Abdomen is soft.     Tenderness: There is abdominal tenderness in the suprapubic area. There is no right CVA tenderness or left CVA tenderness.  Neurological:  Mental Status: She is alert and oriented to person, place, and time.      UC  Treatments / Results  Labs (all labs ordered are listed, but only abnormal results are displayed) Labs Reviewed  POCT URINALYSIS DIP (MANUAL ENTRY) - Abnormal; Notable for the following components:      Result Value   Clarity, UA cloudy (*)    Bilirubin, UA small (*)    Spec Grav, UA >=1.030 (*)    Blood, UA moderate (*)    Protein Ur, POC =100 (*)    All other components within normal limits  URINE CULTURE    EKG   Radiology No results found.  Procedures Procedures (including critical care time)  Medications Ordered in UC Medications - No data to display  Initial Impression / Assessment and Plan / UC Course  I have reviewed the triage vital signs and the nursing notes.  Pertinent labs & imaging results that were available during my care of the patient were reviewed by me and considered in my medical decision making (see chart for details).  Dysuria  Urinalysis negative, sent for culture, empirically treating with antibiotics she is symptomatic, Bactrim  prescribed as well as Pyridium  recommended supportive care and advised follow-up with PCP if symptoms continue to persist Final Clinical Impressions(s) / UC Diagnoses   Final diagnoses:  Dysuria   Discharge Instructions      Your urinalysis show bacteria at this time, your urine will be sent to the lab to determine exactly which bacteria is present, if any changes need to be made to your medications you will be notified  Begin use of Bactrim  twice daily for 7 days  May use Pyridium  every 8 hours as needed to help with discomfor  Increase your fluid intake through use of water  As always practice good hygiene, wiping front to back and avoidance of scented vaginal products to prevent further irritation  If symptoms continue to persist after use of medication or recur please follow-up with urgent care or your primary doctor as needed   ED Prescriptions     Medication Sig Dispense Auth. Provider    sulfamethoxazole -trimethoprim  (BACTRIM  DS) 800-160 MG tablet Take 1 tablet by mouth 2 (two) times daily for 7 days. 14 tablet Matix Henshaw R, NP   phenazopyridine  (PYRIDIUM ) 200 MG tablet Take 1 tablet (200 mg total) by mouth 3 (three) times daily. 6 tablet Ciro Tashiro R, NP      PDMP not reviewed this encounter.   Reena Canning, NP 12/13/23 1116

## 2023-12-13 NOTE — ED Triage Notes (Signed)
 Pt reports urinary frequency, dysuria and bladder spasms since Saturday. Tried cranberry juice without relief. Has lower back pain

## 2023-12-14 LAB — URINE CULTURE: Culture: NO GROWTH

## 2023-12-15 ENCOUNTER — Ambulatory Visit (HOSPITAL_COMMUNITY): Payer: Self-pay

## 2024-01-05 ENCOUNTER — Encounter (HOSPITAL_COMMUNITY): Payer: Self-pay | Admitting: Emergency Medicine

## 2024-01-05 ENCOUNTER — Emergency Department (HOSPITAL_COMMUNITY)

## 2024-01-05 ENCOUNTER — Other Ambulatory Visit: Payer: Self-pay

## 2024-01-05 ENCOUNTER — Observation Stay (HOSPITAL_COMMUNITY)

## 2024-01-05 ENCOUNTER — Inpatient Hospital Stay (HOSPITAL_COMMUNITY)
Admission: EM | Admit: 2024-01-05 | Discharge: 2024-01-07 | DRG: 494 | Disposition: A | Discharge status: Home / Self Care | Attending: Student | Admitting: Student

## 2024-01-05 DIAGNOSIS — M25532 Pain in left wrist: Secondary | ICD-10-CM | POA: Diagnosis not present

## 2024-01-05 DIAGNOSIS — Z5329 Procedure and treatment not carried out because of patient's decision for other reasons: Secondary | ICD-10-CM | POA: Diagnosis not present

## 2024-01-05 DIAGNOSIS — Z9889 Other specified postprocedural states: Secondary | ICD-10-CM | POA: Diagnosis not present

## 2024-01-05 DIAGNOSIS — Y92410 Unspecified street and highway as the place of occurrence of the external cause: Secondary | ICD-10-CM | POA: Diagnosis not present

## 2024-01-05 DIAGNOSIS — F17210 Nicotine dependence, cigarettes, uncomplicated: Secondary | ICD-10-CM | POA: Diagnosis not present

## 2024-01-05 DIAGNOSIS — L940 Localized scleroderma [morphea]: Secondary | ICD-10-CM | POA: Diagnosis present

## 2024-01-05 DIAGNOSIS — I10 Essential (primary) hypertension: Secondary | ICD-10-CM | POA: Diagnosis present

## 2024-01-05 DIAGNOSIS — S42492A Other displaced fracture of lower end of left humerus, initial encounter for closed fracture: Principal | ICD-10-CM | POA: Diagnosis present

## 2024-01-05 DIAGNOSIS — Z832 Family history of diseases of the blood and blood-forming organs and certain disorders involving the immune mechanism: Secondary | ICD-10-CM | POA: Diagnosis not present

## 2024-01-05 DIAGNOSIS — E039 Hypothyroidism, unspecified: Secondary | ICD-10-CM | POA: Diagnosis not present

## 2024-01-05 DIAGNOSIS — Z803 Family history of malignant neoplasm of breast: Secondary | ICD-10-CM | POA: Diagnosis not present

## 2024-01-05 DIAGNOSIS — Z793 Long term (current) use of hormonal contraceptives: Secondary | ICD-10-CM

## 2024-01-05 DIAGNOSIS — Z885 Allergy status to narcotic agent status: Secondary | ICD-10-CM

## 2024-01-05 DIAGNOSIS — Z833 Family history of diabetes mellitus: Secondary | ICD-10-CM | POA: Diagnosis not present

## 2024-01-05 DIAGNOSIS — Z79899 Other long term (current) drug therapy: Secondary | ICD-10-CM

## 2024-01-05 DIAGNOSIS — S42422A Displaced comminuted supracondylar fracture without intercondylar fracture of left humerus, initial encounter for closed fracture: Principal | ICD-10-CM

## 2024-01-05 DIAGNOSIS — K219 Gastro-esophageal reflux disease without esophagitis: Secondary | ICD-10-CM | POA: Diagnosis present

## 2024-01-05 DIAGNOSIS — S42402A Unspecified fracture of lower end of left humerus, initial encounter for closed fracture: Secondary | ICD-10-CM | POA: Diagnosis not present

## 2024-01-05 DIAGNOSIS — S42412A Displaced simple supracondylar fracture without intercondylar fracture of left humerus, initial encounter for closed fracture: Secondary | ICD-10-CM | POA: Diagnosis not present

## 2024-01-05 DIAGNOSIS — G43909 Migraine, unspecified, not intractable, without status migrainosus: Secondary | ICD-10-CM | POA: Diagnosis not present

## 2024-01-05 DIAGNOSIS — Z8249 Family history of ischemic heart disease and other diseases of the circulatory system: Secondary | ICD-10-CM

## 2024-01-05 DIAGNOSIS — Z7982 Long term (current) use of aspirin: Secondary | ICD-10-CM

## 2024-01-05 DIAGNOSIS — Z823 Family history of stroke: Secondary | ICD-10-CM | POA: Diagnosis not present

## 2024-01-05 DIAGNOSIS — S42452A Displaced fracture of lateral condyle of left humerus, initial encounter for closed fracture: Secondary | ICD-10-CM | POA: Diagnosis not present

## 2024-01-05 DIAGNOSIS — Z471 Aftercare following joint replacement surgery: Secondary | ICD-10-CM | POA: Diagnosis not present

## 2024-01-05 LAB — MRSA NEXT GEN BY PCR, NASAL: MRSA by PCR Next Gen: NOT DETECTED

## 2024-01-05 LAB — HCG, SERUM, QUALITATIVE: Preg, Serum: NEGATIVE

## 2024-01-05 MED ORDER — MORPHINE SULFATE (PF) 2 MG/ML IV SOLN
2.0000 mg | INTRAVENOUS | Status: DC | PRN
  Administered 2024-01-05: 2 mg via INTRAVENOUS
  Filled 2024-01-05: qty 1

## 2024-01-05 MED ORDER — ONDANSETRON HCL 4 MG PO TABS
4.0000 mg | ORAL_TABLET | Freq: Four times a day (QID) | ORAL | Status: AC | PRN
Start: 2024-01-05 — End: ?

## 2024-01-05 MED ORDER — OXYCODONE HCL 5 MG PO TABS
5.0000 mg | ORAL_TABLET | ORAL | Status: DC | PRN
  Administered 2024-01-05 – 2024-01-06 (×4): 15 mg via ORAL
  Administered 2024-01-07 (×2): 10 mg via ORAL
  Filled 2024-01-05 (×2): qty 3
  Filled 2024-01-05: qty 2
  Filled 2024-01-05: qty 3
  Filled 2024-01-05: qty 1
  Filled 2024-01-05: qty 2
  Filled 2024-01-05: qty 3

## 2024-01-05 MED ORDER — HYDROMORPHONE HCL 1 MG/ML IJ SOLN
1.0000 mg | Freq: Once | INTRAMUSCULAR | Status: AC
  Administered 2024-01-05: 1 mg via INTRAVENOUS
  Filled 2024-01-05: qty 1

## 2024-01-05 MED ORDER — ACETAMINOPHEN 500 MG PO TABS
1000.0000 mg | ORAL_TABLET | Freq: Four times a day (QID) | ORAL | Status: DC
  Administered 2024-01-05 – 2024-01-07 (×6): 1000 mg via ORAL
  Filled 2024-01-05 (×6): qty 2

## 2024-01-05 MED ORDER — ENOXAPARIN SODIUM 40 MG/0.4ML IJ SOSY
40.0000 mg | PREFILLED_SYRINGE | INTRAMUSCULAR | Status: DC
  Administered 2024-01-05: 40 mg via SUBCUTANEOUS
  Filled 2024-01-05: qty 0.4

## 2024-01-05 MED ORDER — ONDANSETRON HCL 4 MG/2ML IJ SOLN
4.0000 mg | Freq: Once | INTRAMUSCULAR | Status: AC
  Administered 2024-01-05: 4 mg via INTRAVENOUS
  Filled 2024-01-05: qty 2

## 2024-01-05 MED ORDER — METHOCARBAMOL 1000 MG/10ML IJ SOLN
500.0000 mg | Freq: Four times a day (QID) | INTRAMUSCULAR | Status: DC | PRN
  Administered 2024-01-05: 500 mg via INTRAVENOUS
  Filled 2024-01-05: qty 10

## 2024-01-05 MED ORDER — MORPHINE SULFATE (PF) 2 MG/ML IV SOLN
2.0000 mg | INTRAVENOUS | Status: DC | PRN
  Administered 2024-01-05 – 2024-01-06 (×3): 2 mg via INTRAVENOUS
  Filled 2024-01-05 (×3): qty 1

## 2024-01-05 MED ORDER — ONDANSETRON HCL 4 MG/2ML IJ SOLN
4.0000 mg | Freq: Four times a day (QID) | INTRAMUSCULAR | Status: DC | PRN
  Administered 2024-01-06: 4 mg via INTRAVENOUS
  Filled 2024-01-05: qty 2

## 2024-01-05 MED ORDER — METHOCARBAMOL 500 MG PO TABS: 500.0000 mg | ORAL_TABLET | Freq: Four times a day (QID) | ORAL | Status: DC | PRN
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
## 2024-01-05 NOTE — ED Notes (Signed)
 PT  taken to CT and was unable to tolerate so brought back

## 2024-01-05 NOTE — Progress Notes (Signed)
 Transport came but pt refused CT scan again; she said she can't tolerate lying down ; pt educated that no CT scan,no surgery per surgical team but she still  declined.
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
## 2024-01-05 NOTE — ED Notes (Signed)
 Ortho tech at bedside
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
## 2024-01-05 NOTE — ED Notes (Signed)
 Ortho tech contacted via cell phone for splinting

## 2024-01-05 NOTE — ED Notes (Signed)
 Patient transported to CT

## 2024-01-06 ENCOUNTER — Other Ambulatory Visit: Payer: Self-pay

## 2024-01-06 ENCOUNTER — Encounter (HOSPITAL_COMMUNITY)
Admission: EM | Disposition: A | Discharge status: Home / Self Care | Payer: Self-pay | Source: Home / Self Care | Attending: Student

## 2024-01-06 ENCOUNTER — Encounter (HOSPITAL_COMMUNITY): Payer: Self-pay | Admitting: Student

## 2024-01-06 ENCOUNTER — Observation Stay (HOSPITAL_COMMUNITY)

## 2024-01-06 ENCOUNTER — Observation Stay (HOSPITAL_COMMUNITY): Admitting: Anesthesiology

## 2024-01-06 DIAGNOSIS — S42492A Other displaced fracture of lower end of left humerus, initial encounter for closed fracture: Secondary | ICD-10-CM | POA: Diagnosis present

## 2024-01-06 DIAGNOSIS — L940 Localized scleroderma [morphea]: Secondary | ICD-10-CM | POA: Diagnosis present

## 2024-01-06 DIAGNOSIS — K219 Gastro-esophageal reflux disease without esophagitis: Secondary | ICD-10-CM | POA: Diagnosis present

## 2024-01-06 DIAGNOSIS — Z833 Family history of diabetes mellitus: Secondary | ICD-10-CM | POA: Diagnosis not present

## 2024-01-06 DIAGNOSIS — Z823 Family history of stroke: Secondary | ICD-10-CM | POA: Diagnosis not present

## 2024-01-06 DIAGNOSIS — Z9889 Other specified postprocedural states: Secondary | ICD-10-CM | POA: Diagnosis not present

## 2024-01-06 DIAGNOSIS — S42402A Unspecified fracture of lower end of left humerus, initial encounter for closed fracture: Secondary | ICD-10-CM | POA: Diagnosis not present

## 2024-01-06 DIAGNOSIS — Z793 Long term (current) use of hormonal contraceptives: Secondary | ICD-10-CM | POA: Diagnosis not present

## 2024-01-06 DIAGNOSIS — Z5329 Procedure and treatment not carried out because of patient's decision for other reasons: Secondary | ICD-10-CM | POA: Diagnosis not present

## 2024-01-06 DIAGNOSIS — Z471 Aftercare following joint replacement surgery: Secondary | ICD-10-CM | POA: Diagnosis not present

## 2024-01-06 DIAGNOSIS — Z79899 Other long term (current) drug therapy: Secondary | ICD-10-CM | POA: Diagnosis not present

## 2024-01-06 DIAGNOSIS — Z8249 Family history of ischemic heart disease and other diseases of the circulatory system: Secondary | ICD-10-CM | POA: Diagnosis not present

## 2024-01-06 DIAGNOSIS — Z803 Family history of malignant neoplasm of breast: Secondary | ICD-10-CM | POA: Diagnosis not present

## 2024-01-06 DIAGNOSIS — Y92410 Unspecified street and highway as the place of occurrence of the external cause: Secondary | ICD-10-CM | POA: Diagnosis not present

## 2024-01-06 DIAGNOSIS — S42412A Displaced simple supracondylar fracture without intercondylar fracture of left humerus, initial encounter for closed fracture: Secondary | ICD-10-CM | POA: Diagnosis not present

## 2024-01-06 DIAGNOSIS — G43909 Migraine, unspecified, not intractable, without status migrainosus: Secondary | ICD-10-CM | POA: Diagnosis present

## 2024-01-06 DIAGNOSIS — E039 Hypothyroidism, unspecified: Secondary | ICD-10-CM | POA: Diagnosis present

## 2024-01-06 DIAGNOSIS — Z7982 Long term (current) use of aspirin: Secondary | ICD-10-CM | POA: Diagnosis not present

## 2024-01-06 DIAGNOSIS — Z832 Family history of diseases of the blood and blood-forming organs and certain disorders involving the immune mechanism: Secondary | ICD-10-CM | POA: Diagnosis not present

## 2024-01-06 DIAGNOSIS — Z885 Allergy status to narcotic agent status: Secondary | ICD-10-CM | POA: Diagnosis not present

## 2024-01-06 DIAGNOSIS — I10 Essential (primary) hypertension: Secondary | ICD-10-CM | POA: Diagnosis present

## 2024-01-06 DIAGNOSIS — F17210 Nicotine dependence, cigarettes, uncomplicated: Secondary | ICD-10-CM | POA: Diagnosis present

## 2024-01-06 HISTORY — PX: ORIF HUMERUS FRACTURE: SHX2126

## 2024-01-06 LAB — CBC
HCT: 38.6 % (ref 36.0–46.0)
Hemoglobin: 13.3 g/dL (ref 12.0–15.0)
MCH: 31.6 pg (ref 26.0–34.0)
MCHC: 34.5 g/dL (ref 30.0–36.0)
MCV: 91.7 fL (ref 80.0–100.0)
Platelets: 203 10*3/uL (ref 150–400)
RBC: 4.21 MIL/uL (ref 3.87–5.11)
RDW: 13.2 % (ref 11.5–15.5)
WBC: 8 10*3/uL (ref 4.0–10.5)
nRBC: 0 % (ref 0.0–0.2)

## 2024-01-06 LAB — BASIC METABOLIC PANEL WITH GFR
Anion gap: 8 (ref 5–15)
BUN: 10 mg/dL (ref 6–20)
CO2: 25 mmol/L (ref 22–32)
Calcium: 8.7 mg/dL — ABNORMAL LOW (ref 8.9–10.3)
Chloride: 105 mmol/L (ref 98–111)
Creatinine, Ser: 0.76 mg/dL (ref 0.44–1.00)
GFR, Estimated: >60 mL/min (ref >60)
Glucose, Bld: 96 mg/dL (ref 70–99)
Potassium: 3.9 mmol/L (ref 3.5–5.1)
Sodium: 138 mmol/L (ref 135–145)

## 2024-01-06 LAB — GLUCOSE, CAPILLARY: Glucose-Capillary: 99 mg/dL (ref 70–99)

## 2024-01-06 SURGERY — OPEN REDUCTION INTERNAL FIXATION (ORIF) DISTAL HUMERUS FRACTURE
Anesthesia: General | Laterality: Left

## 2024-01-06 MED ORDER — LIDOCAINE 2% (20 MG/ML) 5 ML SYRINGE
INTRAMUSCULAR | Status: DC | PRN
  Administered 2024-01-06: 60 mg via INTRAVENOUS

## 2024-01-06 MED ORDER — CEFAZOLIN SODIUM-DEXTROSE 2-4 GM/100ML-% IV SOLN
INTRAVENOUS | Status: AC
  Filled 2024-01-06: qty 100

## 2024-01-06 MED ORDER — POVIDONE-IODINE 10 % EX SWAB
2.0000 | Freq: Once | CUTANEOUS | Status: AC
  Administered 2024-01-06: 2 via TOPICAL

## 2024-01-06 MED ORDER — ONDANSETRON HCL 4 MG/2ML IJ SOLN
INTRAMUSCULAR | Status: DC | PRN
  Administered 2024-01-06: 4 mg via INTRAVENOUS

## 2024-01-06 MED ORDER — DOCUSATE SODIUM 100 MG PO CAPS
100.0000 mg | ORAL_CAPSULE | Freq: Two times a day (BID) | ORAL | Status: DC
  Administered 2024-01-06 – 2024-01-07 (×2): 100 mg via ORAL
  Filled 2024-01-06 (×2): qty 1

## 2024-01-06 MED ORDER — MIDAZOLAM HCL 2 MG/2ML IJ SOLN: 2.0000 mg | Freq: Once | INTRAMUSCULAR | Status: AC

## 2024-01-06 MED ORDER — PHENYLEPHRINE HCL-NACL 20-0.9 MG/250ML-% IV SOLN
INTRAVENOUS | Status: DC | PRN
  Administered 2024-01-06: 10 ug/min via INTRAVENOUS

## 2024-01-06 MED ORDER — SODIUM CHLORIDE 0.9% FLUSH
3.0000 mL | Freq: Two times a day (BID) | INTRAVENOUS | Status: DC
  Administered 2024-01-06: 3 mL via INTRAVENOUS
  Administered 2024-01-07: 10 mL via INTRAVENOUS

## 2024-01-06 MED ORDER — SODIUM CHLORIDE 0.9% FLUSH: 3.0000 mL | INTRAVENOUS | Status: DC | PRN

## 2024-01-06 MED ORDER — TRANEXAMIC ACID-NACL 1000-0.7 MG/100ML-% IV SOLN
1000.0000 mg | INTRAVENOUS | Status: AC
  Administered 2024-01-06: 1000 mg via INTRAVENOUS

## 2024-01-06 MED ORDER — METOCLOPRAMIDE HCL 5 MG/ML IJ SOLN: 5.0000 mg | Freq: Three times a day (TID) | INTRAMUSCULAR | Status: DC | PRN

## 2024-01-06 MED ORDER — ROCURONIUM BROMIDE 10 MG/ML (PF) SYRINGE
PREFILLED_SYRINGE | INTRAVENOUS | Status: DC | PRN
  Administered 2024-01-06: 60 mg via INTRAVENOUS
  Administered 2024-01-06: 20 mg via INTRAVENOUS

## 2024-01-06 MED ORDER — LACTATED RINGERS IV SOLN: INTRAVENOUS | Status: DC

## 2024-01-06 MED ORDER — CEFAZOLIN SODIUM-DEXTROSE 2-4 GM/100ML-% IV SOLN
2.0000 g | INTRAVENOUS | Status: AC
  Administered 2024-01-06: 2 g via INTRAVENOUS

## 2024-01-06 MED ORDER — TRANEXAMIC ACID-NACL 1000-0.7 MG/100ML-% IV SOLN
INTRAVENOUS | Status: AC
  Filled 2024-01-06: qty 100

## 2024-01-06 MED ORDER — ORAL CARE MOUTH RINSE: 15.0000 mL | Freq: Once | OROMUCOSAL | Status: AC

## 2024-01-06 MED ORDER — FENTANYL CITRATE (PF) 100 MCG/2ML IJ SOLN: 50.0000 ug | Freq: Once | INTRAMUSCULAR | Status: AC

## 2024-01-06 MED ORDER — MIDAZOLAM HCL 2 MG/2ML IJ SOLN
INTRAMUSCULAR | Status: AC
  Filled 2024-01-06: qty 2

## 2024-01-06 MED ORDER — FENTANYL CITRATE (PF) 250 MCG/5ML IJ SOLN
INTRAMUSCULAR | Status: DC | PRN
  Administered 2024-01-06: 50 ug via INTRAVENOUS

## 2024-01-06 MED ORDER — FENTANYL CITRATE (PF) 250 MCG/5ML IJ SOLN
INTRAMUSCULAR | Status: AC
  Filled 2024-01-06: qty 5

## 2024-01-06 MED ORDER — CHLORHEXIDINE GLUCONATE 4 % EX SOLN
60.0000 mL | Freq: Once | CUTANEOUS | Status: AC
  Administered 2024-01-06: 4 via TOPICAL

## 2024-01-06 MED ORDER — VANCOMYCIN HCL 1000 MG IV SOLR
INTRAVENOUS | Status: AC
Start: 2024-01-06 — End: 2024-01-06
  Filled 2024-01-06: qty 20

## 2024-01-06 MED ORDER — PHENYLEPHRINE 80 MCG/ML (10ML) SYRINGE FOR IV PUSH (FOR BLOOD PRESSURE SUPPORT)
PREFILLED_SYRINGE | INTRAVENOUS | Status: AC
  Filled 2024-01-06: qty 10

## 2024-01-06 MED ORDER — 0.9 % SODIUM CHLORIDE (POUR BTL) OPTIME
TOPICAL | Status: DC | PRN
  Administered 2024-01-06: 1000 mL

## 2024-01-06 MED ORDER — DIPHENHYDRAMINE HCL 12.5 MG/5ML PO ELIX: 12.5000 mg | ORAL_SOLUTION | ORAL | Status: DC | PRN

## 2024-01-06 MED ORDER — EPHEDRINE 5 MG/ML INJ
INTRAVENOUS | Status: AC
Start: 2024-01-06 — End: 2024-01-06
  Filled 2024-01-06: qty 5

## 2024-01-06 MED ORDER — METOCLOPRAMIDE HCL 5 MG PO TABS: 5.0000 mg | ORAL_TABLET | Freq: Three times a day (TID) | ORAL | Status: DC | PRN

## 2024-01-06 MED ORDER — BUPIVACAINE HCL (PF) 0.5 % IJ SOLN
INTRAMUSCULAR | Status: DC | PRN
  Administered 2024-01-06: 15 mL via PERINEURAL

## 2024-01-06 MED ORDER — PROPOFOL 10 MG/ML IV BOLUS
INTRAVENOUS | Status: DC | PRN
  Administered 2024-01-06: 160 mg via INTRAVENOUS

## 2024-01-06 MED ORDER — CEFAZOLIN SODIUM-DEXTROSE 2-4 GM/100ML-% IV SOLN
2.0000 g | Freq: Three times a day (TID) | INTRAVENOUS | Status: DC
  Administered 2024-01-06 – 2024-01-07 (×2): 2 g via INTRAVENOUS
  Filled 2024-01-06 (×3): qty 100

## 2024-01-06 MED ORDER — BUPIVACAINE LIPOSOME 1.3 % IJ SUSP
INTRAMUSCULAR | Status: AC
  Filled 2024-01-06: qty 10

## 2024-01-06 MED ORDER — FENTANYL CITRATE (PF) 100 MCG/2ML IJ SOLN
INTRAMUSCULAR | Status: AC
  Administered 2024-01-06: 50 ug via INTRAVENOUS
  Filled 2024-01-06: qty 2

## 2024-01-06 MED ORDER — CHLORHEXIDINE GLUCONATE 0.12 % MT SOLN
15.0000 mL | Freq: Once | OROMUCOSAL | Status: AC
Start: 2024-01-06 — End: 2024-01-06

## 2024-01-06 MED ORDER — KETOROLAC TROMETHAMINE 15 MG/ML IJ SOLN
15.0000 mg | Freq: Four times a day (QID) | INTRAMUSCULAR | Status: DC
  Administered 2024-01-06 – 2024-01-07 (×3): 15 mg via INTRAVENOUS
  Filled 2024-01-06 (×4): qty 1

## 2024-01-06 MED ORDER — ASPIRIN 81 MG PO TBEC
81.0000 mg | DELAYED_RELEASE_TABLET | Freq: Every day | ORAL | Status: DC
  Administered 2024-01-07: 81 mg via ORAL
  Filled 2024-01-06: qty 1

## 2024-01-06 MED ORDER — MIDAZOLAM HCL 2 MG/2ML IJ SOLN
INTRAMUSCULAR | Status: AC
  Administered 2024-01-06: 2 mg via INTRAVENOUS
  Filled 2024-01-06: qty 2

## 2024-01-06 MED ORDER — VANCOMYCIN HCL 1000 MG IV SOLR
INTRAVENOUS | Status: DC | PRN
  Administered 2024-01-06: 1000 mg via TOPICAL

## 2024-01-06 MED ORDER — HYDROMORPHONE HCL 1 MG/ML IJ SOLN: 0.5000 mg | INTRAMUSCULAR | Status: DC | PRN

## 2024-01-06 MED ORDER — DEXAMETHASONE SODIUM PHOSPHATE 10 MG/ML IJ SOLN
INTRAMUSCULAR | Status: DC | PRN
  Administered 2024-01-06: 10 mg via INTRAVENOUS

## 2024-01-06 MED ORDER — CHLORHEXIDINE GLUCONATE 0.12 % MT SOLN
OROMUCOSAL | Status: AC
  Administered 2024-01-06: 15 mL via OROMUCOSAL
  Filled 2024-01-06: qty 15

## 2024-01-06 MED ORDER — BUPIVACAINE LIPOSOME 1.3 % IJ SUSP
INTRAMUSCULAR | Status: DC | PRN
  Administered 2024-01-06: 10 mL via PERINEURAL

## 2024-01-06 MED ORDER — SUGAMMADEX SODIUM 200 MG/2ML IV SOLN
INTRAVENOUS | Status: DC | PRN
  Administered 2024-01-06: 181.4 mg via INTRAVENOUS

## 2024-01-06 MED ORDER — POLYETHYLENE GLYCOL 3350 17 G PO PACK: 17.0000 g | PACK | Freq: Every day | ORAL | Status: DC | PRN

## 2024-01-06 SURGICAL SUPPLY — 82 items
BAG COUNTER SPONGE SURGICOUNT (BAG) ×1 IMPLANT
BIT DRILL 3.2 QUICK MINI 300 (DRILL) IMPLANT
BIT DRILL 5.0 QC 6.5 (BIT) IMPLANT
BIT DRILL QC 2.0 SHORT EVOS SM (DRILL) IMPLANT
BIT DRILL QC 2.5MM SHRT EVO SM (DRILL) IMPLANT
BLADE AVERAGE 25X9 (BLADE) IMPLANT
BNDG COHESIVE 4X5 TAN STRL LF (GAUZE/BANDAGES/DRESSINGS) ×1 IMPLANT
BNDG COMPR ESMARK 4X3 LF (GAUZE/BANDAGES/DRESSINGS) IMPLANT
BNDG ELASTIC 3INX 5YD STR LF (GAUZE/BANDAGES/DRESSINGS) IMPLANT
BNDG ELASTIC 4INX 5YD STR LF (GAUZE/BANDAGES/DRESSINGS) IMPLANT
BNDG GAUZE DERMACEA FLUFF 4 (GAUZE/BANDAGES/DRESSINGS) ×2 IMPLANT
BRUSH SCRUB EZ PLAIN DRY (MISCELLANEOUS) ×2 IMPLANT
CLEANER TIP ELECTROSURG 2X2 (MISCELLANEOUS) ×1 IMPLANT
CORD BIPOLAR FORCEPS 12FT (ELECTRODE) ×1 IMPLANT
COVER SURGICAL LIGHT HANDLE (MISCELLANEOUS) ×1 IMPLANT
DRAIN PENROSE 12X.25 LTX STRL (MISCELLANEOUS) IMPLANT
DRAPE C-ARM 42X72 X-RAY (DRAPES) ×1 IMPLANT
DRAPE C-ARMOR (DRAPES) IMPLANT
DRAPE HALF SHEET 40X57 (DRAPES) ×1 IMPLANT
DRAPE INCISE IOBAN 66X45 STRL (DRAPES) IMPLANT
DRAPE SURG ORHT 6 SPLT 77X108 (DRAPES) ×1 IMPLANT
DRAPE U-SHAPE 47X51 STRL (DRAPES) ×1 IMPLANT
DRSG ADAPTIC 3X8 NADH LF (GAUZE/BANDAGES/DRESSINGS) ×1 IMPLANT
DRSG MEPITEL 4X7.2 (GAUZE/BANDAGES/DRESSINGS) IMPLANT
ELECT CAUTERY BLADE 6.4 (BLADE) IMPLANT
ELECTRODE REM PT RTRN 9FT ADLT (ELECTROSURGICAL) ×1 IMPLANT
EVACUATOR 1/8 PVC DRAIN (DRAIN) IMPLANT
GAUZE PAD ABD 8X10 STRL (GAUZE/BANDAGES/DRESSINGS) ×1 IMPLANT
GAUZE SPONGE 4X4 12PLY STRL (GAUZE/BANDAGES/DRESSINGS) ×2 IMPLANT
GLOVE BIO SURGEON STRL SZ 6.5 (GLOVE) ×3 IMPLANT
GLOVE BIO SURGEON STRL SZ7.5 (GLOVE) ×3 IMPLANT
GLOVE BIOGEL PI IND STRL 6.5 (GLOVE) ×1 IMPLANT
GLOVE BIOGEL PI IND STRL 7.5 (GLOVE) ×1 IMPLANT
GOWN STRL REUS W/ TWL LRG LVL3 (GOWN DISPOSABLE) ×3 IMPLANT
GOWN STRL REUS W/ TWL XL LVL3 (GOWN DISPOSABLE) ×1 IMPLANT
KIT BASIN OR (CUSTOM PROCEDURE TRAY) ×1 IMPLANT
KIT TURNOVER KIT B (KITS) ×1 IMPLANT
KWIRE FX150X1.6XTROC PNT (WIRE) IMPLANT
LOOP VASCLR MAXI BLUE 18IN ST (MISCELLANEOUS) ×1 IMPLANT
LOOPS VASCLR MAXI BLUE 18IN ST (MISCELLANEOUS) ×1 IMPLANT
MANIFOLD NEPTUNE II (INSTRUMENTS) ×1 IMPLANT
NDL HYPO 25X1 1.5 SAFETY (NEEDLE) IMPLANT
NEEDLE HYPO 25X1 1.5 SAFETY (NEEDLE) IMPLANT
NS IRRIG 1000ML POUR BTL (IV SOLUTION) ×1 IMPLANT
PACK ORTHO EXTREMITY (CUSTOM PROCEDURE TRAY) ×1 IMPLANT
PAD ABD 8X10 STRL (GAUZE/BANDAGES/DRESSINGS) IMPLANT
PAD ARMBOARD POSITIONER FOAM (MISCELLANEOUS) ×2 IMPLANT
PAD CAST 3X4 CTTN HI CHSV (CAST SUPPLIES) IMPLANT
PAD CAST 4YDX4 CTTN HI CHSV (CAST SUPPLIES) IMPLANT
PENCIL BUTTON HOLSTER BLD 10FT (ELECTRODE) IMPLANT
PLATE HUM EVOS 3H L 2.7X80 (Plate) IMPLANT
PLATE HUM EVOS 6H L 2.7X85 (Plate) IMPLANT
SCREW BONE LOCKING 2.7 X 28 (Screw) IMPLANT
SCREW CANN 6.5X110 (Screw) IMPLANT
SCREW CORT 2.7X16 STAR T8 EVOS (Screw) IMPLANT
SCREW CORT 3.5X15 ST EVOS (Screw) IMPLANT
SCREW CORT 3.5X17 ST EVOS (Screw) IMPLANT
SCREW CORT 3.5X22 ST EVOS (Screw) IMPLANT
SCREW CORT VA EVOS 2.7X28 (Screw) IMPLANT
SCREW CORT VA EVOS 2.7X48 (Screw) IMPLANT
SCREW CORTEX 3.5X26 (Screw) IMPLANT
SCREW EVOS 2.7 X 50 LCK T8 S-T (Screw) IMPLANT
SCREW LOCK EVOS 2.7X32 (Screw) IMPLANT
SCREW LOCK ST EVOS 2.7X20 (Screw) IMPLANT
SPLINT PLASTER CAST FAST 5X30 (CAST SUPPLIES) IMPLANT
SPONGE T-LAP 18X18 ~~LOC~~+RFID (SPONGE) IMPLANT
STAPLER SKIN PROX 35W (STAPLE) IMPLANT
STOCKINETTE IMPERVIOUS 9X36 MD (GAUZE/BANDAGES/DRESSINGS) ×1 IMPLANT
SUCTION TUBE FRAZIER 10FR DISP (SUCTIONS) ×1 IMPLANT
SUT ETHILON 3 0 PS 1 (SUTURE) ×2 IMPLANT
SUT MON AB 2-0 CT1 36 (SUTURE) IMPLANT
SUT VIC AB 0 CT1 27XBRD ANBCTR (SUTURE) ×2 IMPLANT
SUT VIC AB 2-0 CT1 TAPERPNT 27 (SUTURE) ×2 IMPLANT
SYR 5ML LL (SYRINGE) IMPLANT
SYR CONTROL 10ML LL (SYRINGE) ×1 IMPLANT
TOWEL GREEN STERILE (TOWEL DISPOSABLE) ×1 IMPLANT
TOWEL GREEN STERILE FF (TOWEL DISPOSABLE) ×1 IMPLANT
TRAY FOLEY MTR SLVR 16FR STAT (SET/KITS/TRAYS/PACK) IMPLANT
TUBE CONNECTING 20X1/4 (TUBING) IMPLANT
WASHER CANN 12.7 STRL (Washer) IMPLANT
WATER STERILE IRR 1000ML POUR (IV SOLUTION) ×1 IMPLANT
YANKAUER SUCT BULB TIP NO VENT (SUCTIONS) IMPLANT

## 2024-01-06 NOTE — Anesthesia Preprocedure Evaluation (Addendum)
 Anesthesia Evaluation  Patient identified by MRN, date of birth, ID band Patient awake    Reviewed: Allergy & Precautions, NPO status , Patient's Chart, lab work & pertinent test results  History of Anesthesia Complications Negative for: history of anesthetic complications  Airway Mallampati: II  TM Distance: >3 FB Neck ROM: Full    Dental no notable dental hx. (+) Teeth Intact, Dental Advisory Given   Pulmonary Current Smoker and Patient abstained from smoking.   Pulmonary exam normal breath sounds clear to auscultation       Cardiovascular (-) angina (-) Past MI Normal cardiovascular exam Rhythm:Regular Rate:Normal     Neuro/Psych  Headaches  Anxiety Depression       GI/Hepatic   Endo/Other    Renal/GU Lab Results      Component                Value               Date                         K                        3.9                 01/06/2024                CO2                      25                  01/06/2024                BUN                      10                  01/06/2024                CREATININE               0.76                01/06/2024                    Musculoskeletal   Abdominal   Peds  Hematology Lab Results      Component                Value               Date                      WBC                      8.0                 01/06/2024                HGB                      13.3                01/06/2024                HCT  38.6                01/06/2024                MCV                      91.7                01/06/2024                PLT                      203                 01/06/2024              Anesthesia Other Findings All: Tramadol  Reproductive/Obstetrics negative OB ROS                             Anesthesia Physical Anesthesia Plan  ASA: 2  Anesthesia Plan: General   Post-op Pain Management: Tylenol  PO (pre-op)*,  Regional block* and Minimal or no pain anticipated   Induction: Intravenous  PONV Risk Score and Plan: Treatment may vary due to age or medical condition, Midazolam , Ondansetron  and Dexamethasone   Airway Management Planned: Oral ETT  Additional Equipment: None  Intra-op Plan:   Post-operative Plan: Extubation in OR  Informed Consent: I have reviewed the patients History and Physical, chart, labs and discussed the procedure including the risks, benefits and alternatives for the proposed anesthesia with the patient or authorized representative who has indicated his/her understanding and acceptance.     Dental advisory given  Plan Discussed with: Surgeon and CRNA  Anesthesia Plan Comments:        Anesthesia Quick Evaluation

## 2024-01-06 NOTE — Interval H&P Note (Signed)
 History and Physical Interval Note:  01/06/2024 11:12 AM  Janet Coleman  has presented today for surgery, with the diagnosis of left distal humerus fracture.  The various methods of treatment have been discussed with the patient and family. After consideration of risks, benefits and other options for treatment, the patient has consented to  Procedure(s): OPEN REDUCTION INTERNAL FIXATION (ORIF) DISTAL HUMERUS FRACTURE (Left) as a surgical intervention.  The patient's history has been reviewed, patient examined, no change in status, stable for surgery.  I have reviewed the patient's chart and labs.  Questions were answered to the patient's satisfaction.     Akiva Brassfield P Cadey Bazile

## 2024-01-06 NOTE — Progress Notes (Signed)
 Transition of Care [facility]) - CAGE-AID Screening   Patient Details  Name: Janet Coleman MRN: [id number] Date of Birth: 1986/02/10  [facility]) CM/SW Contact:    Marvis Sluder, RN Phone Number: 01/06/2024, 7:36 PM   Clinical Narrative: Denies alcohol and drug use, no resources indicated   CAGE-AID Screening:    Have You Ever Felt You Ought to Cut Down on Your Drinking or Drug Use?: No Have People Annoyed You By Critizing Your Drinking Or Drug Use?: No Have You Felt Bad Or Guilty About Your Drinking Or Drug Use?: No Have You Ever Had a Drink or Used Drugs First Thing In The Morning to Steady Your Nerves or to Get Rid of a Hangover?: No CAGE-AID Score: 0  Substance Abuse Education Offered: No

## 2024-01-06 NOTE — Anesthesia Procedure Notes (Signed)
 Anesthesia Regional Block: Interscalene brachial plexus block   Pre-Anesthetic Checklist: , timeout performed,  Correct Patient, Correct Site, Correct Laterality,  Correct Procedure, Correct Position, site marked,  Risks and benefits discussed,  Surgical consent,  Pre-op evaluation,  At surgeon's request and post-op pain management  Laterality: Upper and Left  Prep: Maximum Sterile Barrier Precautions used, chloraprep       Needles:  Injection technique: Single-shot  Needle Type: Echogenic Needle     Needle Length: 5cm  Needle Gauge: 21     Additional Needles:   Procedures:,,,, ultrasound used (permanent image in chart),,    Narrative:  Start time: 01/06/2024 11:39 AM End time: 01/06/2024 11:44 AM Injection made incrementally with aspirations every 5 mL.  Performed by: Personally  Anesthesiologist: Rosalita Combe, MD  Additional Notes: Block assessed prior to procedure. Patient tolerated procedure well.

## 2024-01-06 NOTE — Discharge Instructions (Signed)
 Katheryne Pane, MD Alona Jamaica PA-C [facility] [address] [phone] (tel)   [phone] (fax)                                  POST-OPERATIVE INSTRUCTIONS   WEIGHT BEARING STATUS: Non-weightbearing left arm  RANGE OF MOTION/ACTIVITY:  keep splint in place. Use sling as needed for comfort  WOUND CARE Please keep splint clean dry and intact until follow-up. If your splint gets wet for any reason please contact the office immediately.  Do not stick anything down your splint such as pencils, momey, hangers to try and scratch yourself.  If you feel itchy take Benadryl  as prescribed on the bottle for itching You may shower on Post-Op Day #2.  You must keep splint dry during this process and may find that a plastic bag taped around the extremity or alternatively a towel based bath may be a better option.   If you get your splint wet or if it is damaged please contact our clinic.  EXERCISES Due to your splint being in place you will not be able to bear weight through your extremity.   Please use your sling until follow-up. Please continue to work on range of motion of your fingers and stretch these multiple times a day to prevent stiffness. Please continue to ambulate and do not stay sitting or lying for too long. Perform foot and wrist pumps to assist in circulation.  DVT/PE prophylaxis: Aspirin 81 mg daily x 30 days  DIET: As you were eating previously.  Can use over the counter stool softeners and bowel preparations, such as Miralax, to help with bowel movements.  Narcotics can be constipating.  Be sure to drink plenty of fluids  REGIONAL ANESTHESIA (NERVE BLOCKS) The anesthesia team may have performed a nerve block for you if safe in the setting of your care.  This is a great tool used to minimize pain.  Typically the block may start wearing off overnight but the long acting medicine may last for 3-4 days.  The nerve block wearing off can be a  challenging period but please utilize your as needed pain medications to try and manage this period.    ICE AND ELEVATE INJURED/OPERATIVE EXTREMITY Using ice and elevating the injured extremity above your heart can help with swelling and pain control.  Icing in a pulsatile fashion, such as 20 minutes on and 20 minutes off, can be followed.   Do not place ice directly on skin. Make sure there is a barrier between to skin and the ice pack.   Using frozen items such as frozen peas works well as the conform nicely to the are that needs to be iced.  POST-OP MEDICATIONS- Multimodal approach to pain control In general your pain will be controlled with a combination of substances.  Prescriptions unless otherwise discussed are electronically sent to your pharmacy.  This is a carefully made plan we use to minimize narcotic use.                     -   Acetaminophen  - Non-narcotic pain medicine taken on a scheduled basis        -   Oxycodone  - This is a strong narcotic, to be used only on an as needed basis for pain.                  -  Robaxin -  Muscle relaxer taken on an as needed basis for muscle spasms                  -   Gabapentin - Helps with nerve pain, such as burning or tingling feeling in the operative arm             STOP SMOKING OR USING NICOTINE  PRODUCTS!!!!  As discussed nicotine  severely impairs your body's ability to heal surgical and traumatic wounds but also impairs bone healing.  Wounds and bone heal by forming microscopic blood vessels (angiogenesis) and nicotine  is a vasoconstrictor (essentially, shrinks blood vessels).  Therefore, if vasoconstriction occurs to these microscopic blood vessels they essentially disappear and are unable to deliver necessary nutrients to the healing tissue.  This is one modifiable factor that you can do to dramatically increase your chances of healing your injury.    (This means no smoking, no nicotine  gum, patches, etc)   FOLLOW-UP If you develop  a Fever (>101.5), Redness or Drainage from the surgical incision site, please call our office to arrange for an evaluation. Please call the office to schedule a follow-up appointment for your incision check if you do not already have one, 7-10 days post-operatively.  CALL THE OFFICE WITH ANY QUESTIONS OR CONCERNS: [phone]   VISIT OUR WEBSITE FOR ADDITIONAL INFORMATION: [email or web address]    OTHER HELPFUL INFORMATION  If you had a block, it will wear off between 8-24 hrs postop typically.  This is period when your pain may go from nearly zero to the pain you would have had postop without the block.  This is an abrupt transition but nothing dangerous is happening.  You may take an extra dose of narcotic when this happens.  You may be more comfortable sleeping in a semi-seated position the first few nights following surgery.  Keep a pillow propped under the elbow and forearm for comfort.  If you have a recliner type of chair it might be beneficial.  If not that is fine too, but it would be helpful to sleep propped up with pillows behind your operated shoulder as well under your elbow and forearm.  This will reduce pulling on the suture lines.  When dressing, put your operative arm in the sleeve first.  When getting undressed, take your operative arm out last.  Loose fitting, button-down shirts are recommended.  Often in the first days after surgery you may be more comfortable keeping your operative arm under your shirt and not through the sleeve.  You may return to work/school in the next couple of days when you feel up to it.  Desk work and typing in the sling is fine.  We suggest you use the pain medication the first night prior to going to bed, in order to ease any pain when the anesthesia wears off. You should avoid taking pain medications on an empty stomach as it will make you nauseous. You should wean off your narcotic medicines as soon as you are able.  Most patients will be off or  using minimal narcotics before their first postop appointment.   Do not drink alcoholic beverages or take illicit drugs when taking pain medications.  It is against the law to drive while taking narcotics.  In some states it is against the law to drive while your arm is in a sling.   Pain medication may make you constipated.  Below are a few solutions to try in this order: Decrease the amount of pain  medication if you aren't having pain. Drink lots of decaffeinated fluids. Drink prune juice and/or each dried prunes  If the first 3 don't work start with additional solutions -   Take Colace - an over-the-counter stool softener -   Take Senokot - an over-the-counter laxative -   Take Miralax - a stronger over-the-counter laxative

## 2024-01-06 NOTE — Anesthesia Procedure Notes (Signed)
 Procedure Name: Intubation Date/Time: 01/06/2024 12:07 PM  Performed by: Laroy Plunk, CRNAPre-anesthesia Checklist: Patient identified, Emergency Drugs available, Suction available and Patient being monitored Patient Re-evaluated:Patient Re-evaluated prior to induction Oxygen Delivery Method: Circle system utilized Preoxygenation: Pre-oxygenation with 100% oxygen Induction Type: IV induction Ventilation: Mask ventilation without difficulty Laryngoscope Size: Mac and 4 Grade View: Grade I Tube type: Oral Tube size: 7.0 mm Number of attempts: 1 Airway Equipment and Method: Stylet Placement Confirmation: ETT inserted through vocal cords under direct vision, positive ETCO2 and breath sounds checked- equal and bilateral Secured at: 22 cm Tube secured with: Tape Dental Injury: Teeth and Oropharynx as per pre-operative assessment

## 2024-01-06 NOTE — Transfer of Care (Signed)
 Immediate Anesthesia Transfer of Care Note  Patient: Janet Coleman  Procedure(s) Performed: OPEN REDUCTION INTERNAL FIXATION (ORIF) DISTAL HUMERUS FRACTURE (Left)  Patient Location: PACU  Anesthesia Type:General  Level of Consciousness: drowsy  Airway & Oxygen Therapy: Patient Spontanous Breathing and Patient connected to face mask oxygen  Post-op Assessment: Report given to RN and Post -op Vital signs reviewed and stable  Post vital signs: Reviewed and stable  Last Vitals:  Vitals Value Taken Time  BP 111/64 01/06/24 14:19  Temp 98   Pulse 83 01/06/24 14:23  Resp 21 01/06/24 14:23  SpO2 97 % 01/06/24 14:23  Vitals shown include unfiled device data.  Last Pain:  Vitals:   01/06/24 1043  TempSrc: Oral  PainSc: 0-No pain         Complications: No notable events documented.

## 2024-01-06 NOTE — Op Note (Signed)
 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 Item Serial No. Manufacturer Lot No. LRB No. Used Action  WASHER CANN 12.7 STRL - [id number] Washer WASHER CANN 12.7 STRL  [employer] [id number] Left 1 Implanted  6.59mm x 46mm Partially Threaded Screw    [employer]  Left 1 Implanted  SCREW CORT VA EVOS 2.7X48 - [id number] Screw SCREW CORT VA EVOS 2.7X48  [employer]  Left 1 Implanted  PLATE HUM EVOS 6H L 2.7X85 - [id number] Plate PLATE HUM EVOS 6H L 2.7X85  [employer]  Left 1 Implanted  SCREW CORT 3.5X15 ST EVOS - [id number] Screw SCREW CORT 3.5X15 ST EVOS  [employer]  Left 1 Implanted  SCREW CORT 3.5X17 ST EVOS - [id number] Screw SCREW CORT 3.5X17 ST EVOS  [employer]  Left 1 Implanted  PLATE HUM EVOS 3H L 2.7X80 - [id number] Plate PLATE HUM EVOS 3H L 2.7X80  [employer]  Left 1 Implanted  SCREW CORTEX 3.5X26 - [id number] Screw SCREW CORTEX 3.5X26  [employer]  Left 1 Implanted  SCREW CORT 3.5X22 ST EVOS - [id number] Screw SCREW CORT 3.5X22 ST EVOS  [employer]  Left 2 Implanted  SCREW BONE LOCKING 2.7 X 28 - [id number] Screw SCREW BONE  LOCKING 2.7 X 28  [employer]  Left 1 Implanted  SCREW EVOS 2.7 X 50 LCK T8 S-T - [id number] Screw SCREW EVOS 2.7 X 50 LCK T8 S-T  [employer]  Left 1 Implanted  SCREW LOCK EVOS 2.7X32 - [id number] Screw SCREW LOCK EVOS 2.7X32  [employer]  Left 2 Implanted  SCREW LOCK ST EVOS 2.7X20 - [id number] Screw SCREW LOCK ST EVOS 2.7X20  [employer]  Left 1 Implanted  SCREW CORT 2.7X16 STAR T8 EVOS - [id number] Screw SCREW CORT 2.7X16 STAR T8 EVOS  [employer]  Left 1 Implanted  SCREW CORT VA EVOS 2.7X28 - [id number] Screw SCREW CORT VA EVOS 2.7X28  [employer]  Left 1 Implanted     Indications for Surgery: 38 year old female who sustained a left supracondylar distal humerus fracture with intra-articular extension.  Due to the unstable nature of her injury I recommend proceeding with open reduction internal fixation with possible olecranon osteotomy.  Risks and benefits were discussed with the patient.  This included but not limited to bleeding, infection, malunion, nonunion, hardware failure, hardware irritation, nerve and blood vessel injury, DVT, posttraumatic arthritis, elbow stiffness, even the possibility anesthetic complications.  She agreed to proceed with surgery and consent was obtained.  Operative Findings: 1.  Open reduction internal fixation of left supracondylar distal humerus fracture with intra-articular extension with shear injury of the anterior portion of the trochlea using [employer]  EVOS posterior lateral distal humerus locking plate and a direct medial distal humeral locking plate. 2.  Olecranon osteotomy for visualization treated with reduction and fixation using [employer] 6.5 mm cannulated screw with a washer.  Procedure: The patient was identified in the preoperative holding area. Consent was confirmed with the patient and their family and all questions were answered. The  operative extremity was marked after confirmation with the patient. she was then brought back to the operating room by our anesthesia colleagues.  She is placed under general anesthetic and carefully transferred over to radiolucent flattop table.  She was placed in the lateral decubitus position with the left side up.  Fluoroscopic imaging showed the unstable nature of her injury as well as the intra-articular nature of her injury.  The left upper extremity was then prepped and draped in usual sterile fashion.  A timeout was performed to verify the patient, the procedure, and the extremity.  Preoperative antibiotics were dosed.  Standard posterior process to the elbow was made and carried down through skin subcutaneous tissue.  I incised through the triceps fascia and developed both the medial and lateral planes.  I started with the lateral side developing the intermuscular interval between the triceps and the anconeus.  I split the anconeus in line with my incision to expose the posterior aspect of the humeral shaft and the humeral metaphysis.  I then incised through the capsule to visualize the articular surface.  Unfortunately there was comminution of the trochlea with a shear fragment off of the trochlea that was very thin and basically entirely made on the cartilage.  At this point I felt that an olecranon osteotomy was needed.  I then turned to the medial side and proceeded to find the ulnar nerve carefully dissected this out and protected this throughout the case.  I dissected it until I reached the cubital tunnel to be able to mobilize it for my olecranon osteotomy.    I then made a capsulotomy to visualize the olecranon and identify the appropriate starting point for a 6.5 mm cannulated screw with fluoroscopy.  I placed the guidepin for a 6.5 mm cannulated screws down the center of the canal.  I then measured the length and chose to use 110 mm screw.  I drilled the near cortex and placed the screw and  then removed it.  I then marked out with Bovie electrocautery the appropriate trajectory of her reverse chevron osteotomy.  I then used an ACL saw to cut the osteotomy in a completed with an osteotome.  I then flipped the triceps in the olecranon up to visualize the articular surface.  The majority of the weightbearing surface of the trochlea was intact.  I removed the anterior cartilaginous piece as I did not feel that was reconstructable.  I then reduced the articular surface with a reduction tenaculum.  I then compressed the trochlea and the capitellum together and was able to get anatomic reduction.  I then held provisionally with a 1.6 mm K wire.  I then drilled and placed a 2.7 millimeter screw holding the condyles together.  I kept the of the reduction tenaculum in place.  I then proceeded to reduce the lateral metaphysis in anatomic position and held it provisionally with 1.6 mm K wire.  I then was able to get a good bleed between the medial epicondyle and condyle and the humeral shaft.  I held this with a reduction tenaculum.  I then chose a posterior lateral 3.5/2.7  distal humeral plate in position this appropriately and drilled and placed nonlocking screws into the humeral shaft.  This was able to correct my sagittal alignment of the fracture.  Another nonlocking screw was placed.  I then turned to my medial side.  I was able to release a portion of soft tissue off the epicondyle to place the femoral plate.  I then positioned this appropriately held it with a K wire and then drilled and placed nonlocking screws into the humeral shaft.  Total of 2 screws were placed and I returned to the distal segment and placed 3 locking screws gaining purchase in the articular block.  I then returned to the lateral side placed another nonlocking screw and proceeded to drill and placed 3 locking screws into the capitellum.  I returned to the medial side and placed another 2.7 mm nonlocking screw into the  metaphysis.  All the K wires and clamps were removed and final fluoroscopic view was obtained.  Olecranon osteotomy was reduced with a reduction tenaculum.  The guidewire was placed for the cannulated screw and a 6.5 mm 110 mm cannulated screw with a washer was placed and obtained excellent compression of the fracture.  Final fluoroscopic imaging was obtained.  The incisions were copiously irrigated.  A gram of vancomycin powder was placed into the incision.  A layered closure of 0 Vicryl, 2-0 Monocryl and 3-0 nylon was used to close the skin.  Sterile dressings were applied.  Patient was then awoken from anesthesia and taken to the PACU in stable condition.  Post Op Plan/Instructions: Patient will be nonweightbearing to the left upper extremity.  She will receive postoperative Ancef.  She will be placed on aspirin for DVT prophylaxis.  Will have her mobilize with physical lactational therapy.  Likely discharge home postoperative day 1.  She will be in a splint for 1 week and then will discontinue that and start gentle range of motion to assist with limiting the elbow stiffness.  I was present and performed the entire surgery.  Alona Jamaica, PA-C did assist me throughout the case. An assistant was necessary given the difficulty in approach, maintenance of reduction and ability to instrument the fracture.   Katheryne Pane, MD [facility]

## 2024-01-07 ENCOUNTER — Encounter (HOSPITAL_COMMUNITY): Payer: Self-pay | Admitting: Student

## 2024-01-07 LAB — BASIC METABOLIC PANEL WITH GFR
Anion gap: 10 (ref 5–15)
BUN: 11 mg/dL (ref 6–20)
CO2: 26 mmol/L (ref 22–32)
Calcium: 8.4 mg/dL — ABNORMAL LOW (ref 8.9–10.3)
Chloride: 103 mmol/L (ref 98–111)
Creatinine, Ser: 0.79 mg/dL (ref 0.44–1.00)
GFR, Estimated: >60 mL/min (ref >60)
Glucose, Bld: 107 mg/dL — ABNORMAL HIGH (ref 70–99)
Potassium: 3.7 mmol/L (ref 3.5–5.1)
Sodium: 139 mmol/L (ref 135–145)

## 2024-01-07 LAB — CBC
HCT: 35.1 % — ABNORMAL LOW (ref 36.0–46.0)
Hemoglobin: 12 g/dL (ref 12.0–15.0)
MCH: 32.2 pg (ref 26.0–34.0)
MCHC: 34.2 g/dL (ref 30.0–36.0)
MCV: 94.1 fL (ref 80.0–100.0)
Platelets: 193 10*3/uL (ref 150–400)
RBC: 3.73 MIL/uL — ABNORMAL LOW (ref 3.87–5.11)
RDW: 13 % (ref 11.5–15.5)
WBC: 10.4 10*3/uL (ref 4.0–10.5)
nRBC: 0 % (ref 0.0–0.2)

## 2024-01-07 MED ORDER — ASPIRIN 81 MG PO TBEC: 81.0000 mg | DELAYED_RELEASE_TABLET | Freq: Every day | ORAL | 0 refills | Status: DC

## 2024-01-07 MED ORDER — METHOCARBAMOL 500 MG PO TABS: 500.0000 mg | ORAL_TABLET | Freq: Four times a day (QID) | ORAL | 0 refills | Status: DC | PRN

## 2024-01-07 MED ORDER — OXYCODONE HCL 10 MG PO TABS: 5.0000 mg | ORAL_TABLET | ORAL | 0 refills | Status: DC | PRN

## 2024-01-07 NOTE — Plan of Care (Signed)
Discharge instructions discussed with patient.  Patient instructed on home medications, restrictions, and follow up appointments. Belongings gathered and sent with patient.  Patients medications sent to [facility].  Patient discharged via wheelchair by this Clinical research associate.
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
## 2024-01-07 NOTE — Anesthesia Postprocedure Evaluation (Addendum)
 Anesthesia Post Note  Patient: Janet Coleman  Procedure(s) Performed: OPEN REDUCTION INTERNAL FIXATION (ORIF) DISTAL HUMERUS FRACTURE (Left)     Patient location during evaluation: PACU Anesthesia Type: General Level of consciousness: awake and alert and oriented Pain management: pain level controlled Vital Signs Assessment: post-procedure vital signs reviewed and stable Respiratory status: spontaneous breathing, nonlabored ventilation and respiratory function stable Cardiovascular status: blood pressure returned to baseline and stable Postop Assessment: no apparent nausea or vomiting Anesthetic complications: no   No notable events documented.                  Chevy Sweigert A.
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
## 2024-01-07 NOTE — Addendum Note (Signed)
 Addendum  created 01/07/24 1527 by Tura Gaines, MD   Clinical Note Signed

## 2024-01-07 NOTE — Plan of Care (Signed)
  Problem: Skin Integrity: Goal: Risk for impaired skin integrity will decrease Outcome: Not Progressing   Problem: Safety: Goal: Ability to remain free from injury will improve Outcome: Not Progressing   Problem: Pain Managment: Goal: General experience of comfort will improve and/or be controlled Outcome: Not Progressing
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
## 2024-01-18 DIAGNOSIS — S42402D Unspecified fracture of lower end of left humerus, subsequent encounter for fracture with routine healing: Secondary | ICD-10-CM | POA: Diagnosis not present

## 2024-02-01 DIAGNOSIS — M25532 Pain in left wrist: Secondary | ICD-10-CM | POA: Diagnosis not present

## 2024-02-01 DIAGNOSIS — S42402D Unspecified fracture of lower end of left humerus, subsequent encounter for fracture with routine healing: Secondary | ICD-10-CM | POA: Diagnosis not present

## 2024-02-03 ENCOUNTER — Ambulatory Visit: Attending: Student

## 2024-02-03 DIAGNOSIS — M25522 Pain in left elbow: Secondary | ICD-10-CM | POA: Diagnosis not present

## 2024-02-03 DIAGNOSIS — M25622 Stiffness of left elbow, not elsewhere classified: Secondary | ICD-10-CM | POA: Diagnosis not present
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
## 2024-02-08 ENCOUNTER — Encounter

## 2024-02-09 ENCOUNTER — Ambulatory Visit

## 2024-02-09 DIAGNOSIS — M25622 Stiffness of left elbow, not elsewhere classified: Secondary | ICD-10-CM

## 2024-02-09 DIAGNOSIS — M25522 Pain in left elbow: Secondary | ICD-10-CM
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
## 2024-02-10 ENCOUNTER — Encounter

## 2024-02-15 ENCOUNTER — Ambulatory Visit

## 2024-02-15 DIAGNOSIS — M25522 Pain in left elbow: Secondary | ICD-10-CM

## 2024-02-15 DIAGNOSIS — M25622 Stiffness of left elbow, not elsewhere classified: Secondary | ICD-10-CM
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
## 2024-02-17 ENCOUNTER — Ambulatory Visit

## 2024-02-17 DIAGNOSIS — M25522 Pain in left elbow: Secondary | ICD-10-CM | POA: Diagnosis not present

## 2024-02-17 DIAGNOSIS — M25622 Stiffness of left elbow, not elsewhere classified: Secondary | ICD-10-CM | POA: Diagnosis not present
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
## 2024-02-22 ENCOUNTER — Ambulatory Visit

## 2024-02-22 DIAGNOSIS — M25622 Stiffness of left elbow, not elsewhere classified: Secondary | ICD-10-CM

## 2024-02-22 DIAGNOSIS — M25522 Pain in left elbow: Secondary | ICD-10-CM
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
## 2024-02-24 ENCOUNTER — Ambulatory Visit

## 2024-02-24 DIAGNOSIS — M25622 Stiffness of left elbow, not elsewhere classified: Secondary | ICD-10-CM | POA: Diagnosis not present

## 2024-02-24 DIAGNOSIS — M25522 Pain in left elbow: Secondary | ICD-10-CM
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
## 2024-02-28 DIAGNOSIS — S42402D Unspecified fracture of lower end of left humerus, subsequent encounter for fracture with routine healing: Secondary | ICD-10-CM | POA: Diagnosis not present

## 2024-03-01 ENCOUNTER — Ambulatory Visit: Attending: Student

## 2024-03-01 DIAGNOSIS — M25522 Pain in left elbow: Secondary | ICD-10-CM | POA: Diagnosis not present

## 2024-03-01 DIAGNOSIS — M25622 Stiffness of left elbow, not elsewhere classified: Secondary | ICD-10-CM | POA: Insufficient documentation
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
## 2024-03-02 ENCOUNTER — Ambulatory Visit

## 2024-03-02 DIAGNOSIS — M25522 Pain in left elbow: Secondary | ICD-10-CM

## 2024-03-02 DIAGNOSIS — M25622 Stiffness of left elbow, not elsewhere classified: Secondary | ICD-10-CM | POA: Diagnosis not present
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
## 2024-03-06 ENCOUNTER — Telehealth: Payer: Self-pay

## 2024-03-06 ENCOUNTER — Ambulatory Visit

## 2024-03-06 NOTE — Telephone Encounter (Signed)
 No show. Called pt who said that she thought her appointment was for tomorrow. Will be able to make it to her next follow up session.

## 2024-03-08 ENCOUNTER — Ambulatory Visit

## 2024-03-10 ENCOUNTER — Ambulatory Visit

## 2024-03-10 DIAGNOSIS — M25522 Pain in left elbow: Secondary | ICD-10-CM

## 2024-03-10 DIAGNOSIS — M25622 Stiffness of left elbow, not elsewhere classified: Secondary | ICD-10-CM | POA: Diagnosis not present
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
## 2024-03-13 ENCOUNTER — Ambulatory Visit

## 2024-03-14 DIAGNOSIS — H5213 Myopia, bilateral: Secondary | ICD-10-CM | POA: Diagnosis not present

## 2024-03-15 ENCOUNTER — Ambulatory Visit

## 2024-03-15 DIAGNOSIS — M25522 Pain in left elbow: Secondary | ICD-10-CM

## 2024-03-15 DIAGNOSIS — M25622 Stiffness of left elbow, not elsewhere classified: Secondary | ICD-10-CM | POA: Diagnosis not present
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
## 2024-03-20 ENCOUNTER — Ambulatory Visit

## 2024-03-21 ENCOUNTER — Ambulatory Visit: Payer: Medicaid Other | Admitting: Nurse Practitioner

## 2024-03-21 DIAGNOSIS — S42402D Unspecified fracture of lower end of left humerus, subsequent encounter for fracture with routine healing: Secondary | ICD-10-CM | POA: Diagnosis not present

## 2024-03-22 ENCOUNTER — Ambulatory Visit

## 2024-03-28 ENCOUNTER — Ambulatory Visit

## 2024-04-04 ENCOUNTER — Telehealth: Payer: Self-pay | Admitting: Neurology

## 2024-04-04 NOTE — Telephone Encounter (Signed)
 Pt called to request medication refill , informed Pt that she will need  appt , Appt Scheduled , Pt requesting    Rimegepant Sulfate (NURTEC) 75 MG TBDP, Ajovy  , rizatriptan    Pt medication is to be sent thru  [id number] [address], [place] - [address] Phone: [phone]  Fax: [phone]
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
## 2024-04-05 ENCOUNTER — Telehealth: Payer: Self-pay

## 2024-04-05 ENCOUNTER — Ambulatory Visit: Attending: Student

## 2024-04-05 ENCOUNTER — Encounter

## 2024-04-05 DIAGNOSIS — M25522 Pain in left elbow: Secondary | ICD-10-CM | POA: Insufficient documentation

## 2024-04-05 DIAGNOSIS — M25622 Stiffness of left elbow, not elsewhere classified: Secondary | ICD-10-CM | POA: Insufficient documentation

## 2024-04-05 NOTE — Telephone Encounter (Unsigned)
 Copied from CRM [id number]. Topic: Clinical - Request for Lab/Test Order >> Apr 05, 2024  9:09 AM Wess RAMAN wrote: Reason for CRM: Patient will be going to the labs for 6 month follow up and needs orders for HTN, HLD, DM2 FU placed.

## 2024-04-05 NOTE — Progress Notes (Unsigned)
 No chief complaint on file.   HISTORY OF PRESENT ILLNESS:  04/05/24 ALL:  [name] returns for follow up for migraines. She was last seen by me 03/2022 and doing well on Emgality  and Nurtec. Since,   04/08/2022 ALL: Janet Coleman is a 38 y.o. female here today for follow up for migraines. She was seen in consult with Dr Ines 12/2021 and advised to stop amitriptyline  and nortriptyline , continue topiramate  and add Ajovy . Rizatriptan  as not effective and Nurtec advised for abortive therapy. Ajovy  was not covered by insurance and she was switched to Emgality . Since, she reports taking one injection (Ajovy  sample was given in office so she had two injections). She reports a malfunction with second Emgality  injection and is waiting for a replacement. She reports Nurtec did help but she has not requested refills. She reports anxiety is really bad right now. She feels nausea may be related to anxiety. She is working with PCP to help manage anxiety.   HISTORY (copied from Dr Sharion previous note)  HPI:  Janet Coleman is a 38 y.o. female here as requested by Melvin Pao, NP for migraines. Started after a miscarriage in Dec 2022. About 6 months have been worsening. Started getting migraines years ago but a sumatriptan  helped. It is a lot of pressure, like a headband, her trigger spots are in the temples and on the top of her head, her scalp is very sensitive all over and her neck, stiffness in her neck. Muscular tension in the neck. Heat helps with the muscular pain. No family history if migraines. She has photophobia/phonophobia, nausea, pulsating/pounding, no vomiting but like she wants to. No vision changes. No hearing changes or swishing in her ears. Her blood pressure is fine. The headaches can be severe. Sumatriptan  helps a little but not a lot. She went to urgent care and got a shot which helped. She has 4 kids and is not having anymore, using birth control. She has been to the eye doctor and  her eyes are fine and her glasses were updated.   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 The right cerebellar tonsil extends 4-5 mm below the level of the foramen magnum. 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 However, no more than mild crowding is appreciated at the level of the foramen magnum.   No cortical encephalomalacia is identified. No significant cerebral white matter disease.   There is no acute infarct.   No evidence of an intracranial mass.   No chronic intracranial blood products.   No extra-axial fluid collection.   No midline shift.   Vascular: Maintained flow voids within the proximal large arterial vessels.   Skull and upper cervical spine: No focal suspicious marrow lesion.   Sinuses/Orbits: No mass or acute finding within the imaged orbits. Minimal mucosal thickening within the bilateral ethmoid sinuses.   IMPRESSION: 1. No evidence of acute intracranial abnormality. 2. 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 The right cerebellar tonsil extends 4-5 mm below the level foramen magnum. 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 However, no more than mild crowding is appreciated at the level of the foramen magnum. 3. Otherwise unremarkable MRI appearance of the brain. 4. Minimal mucosal thickening within the bilateral ethmoid sinuses.   REVIEW OF SYSTEMS: Out of a complete 14 system review of symptoms,  the patient complains only of the following symptoms, headaches, anxiety and all other reviewed systems are negative.   ALLERGIES: Allergies  Allergen Reactions   Tramadol Swelling     HOME MEDICATIONS: Outpatient Medications Prior to Visit  Medication Sig Dispense Refill   acetaminophen  (TYLENOL ) 325 MG tablet Take 650 mg by  mouth as needed for mild pain (pain score 1-3) or moderate pain (pain score 4-6).     aspirin  EC 81 MG tablet Take 1 tablet (81 mg total) by mouth daily. Swallow whole. 30 tablet 0   diphenhydrAMINE  HCl (ALLERGY MEDICATION PO) Take 1 tablet by mouth as needed.     etonogestrel -ethinyl estradiol  (NUVARING) 0.12-0.015 MG/24HR vaginal ring Insert vaginally and leave in place for 3 consecutive weeks, then remove for 1 week. 1 each 12   methocarbamol  (ROBAXIN ) 500 MG tablet Take 1 tablet (500 mg total) by mouth every 6 (six) hours as needed for muscle spasms. 28 tablet 0   oxyCODONE  10 MG TABS Take 0.5-1 tablets (5-10 mg total) by mouth every 4 (four) hours as needed for moderate pain (pain score 4-6) or severe pain (pain score 7-10). 42 tablet 0   Rimegepant Sulfate (NURTEC) 75 MG TBDP Take 1 tablet by mouth as needed.     No facility-administered medications prior to visit.     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   Migraines    Reactive hypoglycemia    Reactive hypoglycemia    Tobacco use    Vaginal Pap smear, abnormal      PAST SURGICAL HISTORY: Past Surgical History:  Procedure Laterality Date   BELPHAROPTOSIS REPAIR     eye lid lift   DILATION AND CURETTAGE OF UTERUS     x 2   DILATION AND EVACUATION N/A 06/30/2021   Procedure: DILATATION AND EVACUATION;  Surgeon: Janit Alm Agent, MD;  Location: [facility];  Service: Gynecology;  Laterality: N/A;   LEEP     x 2. last paps 2011, 2012, 2013 were normal   ORIF HUMERUS FRACTURE Left 01/06/2024   Procedure: OPEN REDUCTION INTERNAL FIXATION (ORIF) DISTAL HUMERUS FRACTURE;  Surgeon: Kendal Franky SQUIBB, MD;  Location: [facility] OR;  Service: Orthopedics;  Laterality: Left;     FAMILY HISTORY: Family History  Problem Relation Age of Onset   Hypertension  Mother    Autoimmune disease Mother    Stroke Father    Hypertension Father    Heart attack Father    Aneurysm Father        brain   Heart disease Father    Breast cancer Maternal Aunt        30s   Breast cancer Maternal Grandmother        30s   Diabetes Maternal Grandmother    Stroke Maternal Grandfather    Diabetes Paternal Grandmother    Heart disease Paternal Grandmother    Migraines Neg Hx      SOCIAL HISTORY: Social History   Socioeconomic History   Marital status: Single    Spouse name: Not on file   Number of children: Not on file   Years of education: Not on file   Highest education level: Not on file  Occupational History   Not on file  Tobacco Use   Smoking status: Every Day    Current packs/day:  0.25    Average packs/day: 0.3 packs/day for 17.0 years (4.3 ttl pk-yrs)    Types: Cigarettes   Smokeless tobacco: Never   Tobacco comments:    Pt vapes everyday  Vaping Use   Vaping status: Every Day  Substance and Sexual Activity   Alcohol use: Yes    Comment: occass   Drug use: No   Sexual activity: Yes    Birth control/protection: Pill  Other Topics Concern   Not on file  Social History Narrative   Not on file   Social Drivers of Health   Financial Resource Strain: Low Risk  (12/20/2018)   Overall Financial Resource Strain (CARDIA)    Difficulty of Paying Living Expenses: Not hard at all  Food Insecurity: No Food Insecurity (01/05/2024)   Hunger Vital Sign    Worried About Running Out of Food in the Last Year: Never true    Ran Out of Food in the Last Year: Never true  Transportation Needs: No Transportation Needs (01/05/2024)   PRAPARE - Administrator, Civil Service (Medical): No    Lack of Transportation (Non-Medical): No  Physical Activity: Not on file  Stress: Not on file  Social Connections: Moderately Integrated (01/05/2024)   Social Connection and Isolation Panel    Frequency of Communication with Friends and Family: More than  three times a week    Frequency of Social Gatherings with Friends and Family: More than three times a week    Attends Religious Services: 1 to 4 times per year    Active Member of [employer] or Organizations: No    Attends Banker Meetings: 1 to 4 times per year    Marital Status: Divorced  Catering manager Violence: Not At Risk (01/05/2024)   Humiliation, Afraid, Rape, and Kick questionnaire    Fear of Current or Ex-Partner: No    Emotionally Abused: No    Physically Abused: No    Sexually Abused: No     PHYSICAL EXAM  There were no vitals filed for this visit.  There is no height or weight on file to calculate BMI.  Generalized: Well developed, in no acute distress  Cardiology: normal rate and rhythm, no murmur auscultated  Respiratory: clear to auscultation bilaterally    Neurological examination  Mentation: Alert oriented to time, place, history taking. Follows all commands speech and language fluent Cranial nerve II-XII: Pupils were equal round reactive to light. Extraocular movements were full, visual field were full on confrontational test. Facial sensation and strength were normal. Head turning and shoulder shrug  were normal and symmetric. Motor: The motor testing reveals 5 over 5 strength of all 4 extremities. Good symmetric motor tone is noted throughout.  Sensory: Sensory testing is intact to soft touch on all 4 extremities. No evidence of extinction is noted.  Coordination: Cerebellar testing reveals good finger-nose-finger  Gait and station: Gait is normal.    DIAGNOSTIC DATA (LABS, IMAGING, TESTING) - I reviewed patient records, labs, notes, testing and imaging myself where available.  Lab Results  Component Value Date   WBC 10.4 01/07/2024   HGB 12.0 01/07/2024   HCT 35.1 (L) 01/07/2024   MCV 94.1 01/07/2024   PLT 193 01/07/2024      Component Value Date/Time   NA 139 01/07/2024 0559   NA 141 09/21/2023 0946   K 3.7 01/07/2024 0559   CL 103  01/07/2024 0559   CO2 26 01/07/2024 0559   GLUCOSE 107 (H) 01/07/2024 0559  GLUCOSE 96 02/20/2015 0748   BUN 11 01/07/2024 0559   BUN 12 09/21/2023 0946   CREATININE 0.79 01/07/2024 0559   CALCIUM 8.4 (L) 01/07/2024 0559   PROT 6.7 09/21/2023 0946   ALBUMIN 4.1 09/21/2023 0946   AST 13 09/21/2023 0946   ALT 18 09/21/2023 0946   ALKPHOS 109 09/21/2023 0946   BILITOT 0.3 09/21/2023 0946   GFRNONAA >60 01/07/2024 0559   GFRAA >60 01/17/2017 0840   Lab Results  Component Value Date   CHOL 217 (H) 09/21/2023   HDL 60 09/21/2023   LDLCALC 140 (H) 09/21/2023   TRIG 96 09/21/2023   CHOLHDL 3.6 09/21/2023   Lab Results  Component Value Date   HGBA1C 5.1 09/02/2022   Lab Results  Component Value Date   VITAMINB12 643 11/07/2021   Lab Results  Component Value Date   TSH 3.240 09/21/2023        No data to display               No data to display           ASSESSMENT AND PLAN  38 y.o. year old female  has a past medical history of GERD (gastroesophageal reflux disease), Hypertension, Hypothyroidism, Localized morphea, Migraines, Reactive hypoglycemia, Reactive hypoglycemia, Tobacco use, and Vaginal Pap smear, abnormal. here with    No diagnosis found.  Santia A Mynhier reports headaches are better on Emgality . She has received an injection of Ajovy  in 12/2021 and took Emgality  injection in 01/2022. She is overdue for August due to malfunction of injection. She reports refill was sent to local pharmacy and she hopes to get that today. I have refilled Nurtec and provided two sample tablets ing the office. Healthy lifestyle habits encouraged. She will follow up with PCP as directed. She will return to see me in 6 months, sooner if needed. She verbalizes understanding and agreement with this plan.    No orders of the defined types were placed in this encounter.    No orders of the defined types were placed in this encounter.    Greig Forbes, MSN, FNP-C 04/05/2024,  1:36 PM  [facility] [address], [address] [place], [place] [address] [phone]

## 2024-04-05 NOTE — Patient Instructions (Signed)
 Below is our plan:  We will restart Emgality . Take two injections this month and then continue 1 injection every 30 days. Restart Nurtec for abortive therapy.   Call me when you get your Emgality  starter dose and I will call in the maintenance dose.   Please make sure you are staying well hydrated. I recommend 50-60 ounces daily. Well balanced diet and regular exercise encouraged. Consistent sleep schedule with 6-8 hours recommended.   Please continue follow up with care team as directed.   Follow up with me in 1 year   You may receive a survey regarding today's visit. I encourage you to leave honest feed back as I do use this information to improve patient care. Thank you for seeing me today!   GENERAL HEADACHE INFORMATION:   Natural supplements: Magnesium Oxide or Magnesium Glycinate 500 mg at bed (up to 800 mg daily) Coenzyme Q10 300 mg in AM Vitamin B2- 200 mg twice a day   Add 1 supplement at a time since even natural supplements can have undesirable side effects. You can sometimes buy supplements cheaper (especially Coenzyme Q10) at www.WebmailGuide.co.za or at [facility].  Migraine with aura: There is increased risk for stroke in women with migraine with aura and a contraindication for the combined contraceptive pill for use by women who have migraine with aura. The risk for women with migraine without aura is lower. However other risk factors like smoking are far more likely to increase stroke risk than migraine. There is a recommendation for no smoking and for the use of OCPs without estrogen such as progestogen only pills particularly for women with migraine with aura.SABRA People who have migraine headaches with auras may be 3 times more likely to have a stroke caused by a blood clot, compared to migraine patients who don't see auras. Women who take hormone-replacement therapy may be 30 percent more likely to suffer a clot-based stroke than women not taking medication containing estrogen. Other  risk factors like smoking and high blood pressure may be  much more important.    Vitamins and herbs that show potential:   Magnesium: Magnesium (250 mg twice a day or 500 mg at bed) has a relaxant effect on smooth muscles such as blood vessels. Individuals suffering from frequent or daily headache usually have low magnesium levels which can be increase with daily supplementation of 400-750 mg. Three trials found 40-90% average headache reduction  when used as a preventative. Magnesium may help with headaches are aura, the best evidence for magnesium is for migraine with aura is its thought to stop the cortical spreading depression we believe is the pathophysiology of migraine aura.Magnesium also demonstrated the benefit in menstrually related migraine.  Magnesium is part of the messenger system in the serotonin cascade and it is a good muscle relaxant.  It is also useful for constipation which can be a side effect of other medications used to treat migraine. Good sources include nuts, whole grains, and tomatoes. Side Effects: loose stool/diarrhea  Riboflavin (vitamin B 2) 200 mg twice a day. This vitamin assists nerve cells in the production of ATP a principal energy storing molecule.  It is necessary for many chemical reactions in the body.  There have been at least 3 clinical trials of riboflavin using 400 mg per day all of which suggested that migraine frequency can be decreased.  All 3 trials showed significant improvement in over half of migraine sufferers.  The supplement is found in bread, cereal, milk, meat, and poultry.  Most Americans get more riboflavin than the recommended daily allowance, however riboflavin deficiency is not necessary for the supplements to help prevent headache. Side effects: energizing, green urine   Coenzyme Q10: This is present in almost all cells in the body and is critical component for the conversion of energy.  Recent studies have shown that a nutritional supplement of  CoQ10 can reduce the frequency of migraine attacks by improving the energy production of cells as with riboflavin.  Doses of 150 mg twice a day have been shown to be effective.   Melatonin: Increasing evidence shows correlation between melatonin secretion and headache conditions.  Melatonin supplementation has decreased headache intensity and duration.  It is widely used as a sleep aid.  Sleep is natures way of dealing with migraine.  A dose of 3 mg is recommended to start for headaches including cluster headache. Higher doses up to 15 mg has been reviewed for use in Cluster headache and have been used. The rationale behind using melatonin for cluster is that many theories regarding the cause of Cluster headache center around the disruption of the normal circadian rhythm in the brain.  This helps restore the normal circadian rhythm.   HEADACHE DIET: Foods and beverages which may trigger migraine Note that only 20% of headache patients are food sensitive. You will know if you are food sensitive if you get a headache consistently 20 minutes to 2 hours after eating a certain food. Only cut out a food if it causes headaches, otherwise you might remove foods you enjoy! What matters most for diet is to eat a well balanced healthy diet full of vegetables and low fat protein, and to not miss meals.   Chocolate, other sweets ALL cheeses except cottage and cream cheese Dairy products, yogurt, sour cream, ice cream Liver Meat extracts (Bovril, Marmite, meat tenderizers) Meats or fish which have undergone aging, fermenting, pickling or smoking. These include: Hotdogs,salami,Lox,sausage, mortadellas,smoked salmon, pepperoni, Pickled herring Pods of broad bean (English beans, Chinese pea pods, [place] (fava) beans, lima and navy beans Ripe avocado, ripe banana Yeast extracts or active yeast preparations such as Brewer's or Fleishman's (commercial bakes goods are permitted) Tomato based foods, pizza (lasagna,  etc.)   MSG (monosodium glutamate) is disguised as many things; look for these common aliases: Monopotassium glutamate Autolysed yeast Hydrolysed protein Sodium caseinate "flavorings" "all natural preservatives Nutrasweet   Avoid all other foods that convincingly provoke headaches.   Resources: The Dizzy Bluford Aid Your Headache Diet, migrainestrong.com  https://zamora-andrews.com/   Caffeine and Migraine For patients that have migraine, caffeine intake more than 3 days per week can lead to dependency and increased migraine frequency. I would recommend cutting back on your caffeine intake as best you can. The recommended amount of caffeine is 200-300 mg daily, although migraine patients may experience dependency at even lower doses. While you may notice an increase in headache temporarily, cutting back will be helpful for headaches in the long run. For more information on caffeine and migraine, visit: https://americanmigrainefoundation.org/resource-library/caffeine-and-migraine/   Headache Prevention Strategies:   1. Maintain a headache diary; learn to identify and avoid triggers.  - This can be a simple note where you log when you had a headache, associated symptoms, and medications used - There are several smartphone apps developed to help track migraines: Migraine Buddy, Migraine Monitor, Curelator N1-Headache App   Common triggers include: Emotional triggers: Emotional/Upset family or friends Emotional/Upset occupation Business reversal/success Anticipation anxiety Crisis-serious Post-crisis periodNew job/position   Physical triggers: Vacation Day  Weekend Strenuous Exercise High Altitude Location New Move Menstrual Day Physical Illness Oversleep/Not enough sleep Weather changes Light: Photophobia or light sesnitivity treatment involves a balance between desensitization and reduction in overly strong input. Use dark  polarized glasses outside, but not inside. Avoid bright or fluorescent light, but do not dim environment to the point that going into a normally lit room hurts. Consider FL-41 tint lenses, which reduce the most irritating wavelengths without blocking too much light.  These can be obtained at axonoptics.com or theraspecs.com Foods: see list above.   2. Limit use of acute treatments (over-the-counter medications, triptans, etc.) to no more than 2 days per week or 10 days per month to prevent medication overuse headache (rebound headache).     3. Follow a regular schedule (including weekends and holidays): Don't skip meals. Eat a balanced diet. 8 hours of sleep nightly. Minimize stress. Exercise 30 minutes per day. Being overweight is associated with a 5 times increased risk of chronic migraine. Keep well hydrated and drink 6-8 glasses of water per day.   4. Initiate non-pharmacologic measures at the earliest onset of your headache. Rest and quiet environment. Relax and reduce stress. Breathe2Relax is a free app that can instruct you on    some simple relaxtion and breathing techniques. Http://Dawnbuse.com is a    free website that provides teaching videos on relaxation.  Also, there are  many apps that   can be downloaded for "mindful" relaxation.  An app called YOGA NIDRA will help walk you through mindfulness. Another app called Calm can be downloaded to give you a structured mindfulness guide with daily reminders and skill development. Headspace for guided meditation Mindfulness Based Stress Reduction Online Course: www.palousemindfulness.com Cold compresses.   5. Don't wait!! Take the maximum allowable dosage of prescribed medication at the first sign of migraine.   6. Compliance:  Take prescribed medication regularly as directed and at the first sign of a migraine.   7. Communicate:  Call your physician when problems arise, especially if your headaches change, increase in frequency/severity,  or become associated with neurological symptoms (weakness, numbness, slurred speech, etc.). Proceed to emergency room if you experience new or worsening symptoms or symptoms do not resolve, if you have new neurologic symptoms or if headache is severe, or for any concerning symptom.   8. Headache/pain management therapies: Consider various complementary methods, including medication, behavioral therapy, psychological counselling, biofeedback, massage therapy, acupuncture, dry needling, and other modalities.  Such measures may reduce the need for medications. Counseling for pain management, where patients learn to function and ignore/minimize their pain, seems to work very well.   9. Recommend changing family's attention and focus away from patient's headaches. Instead, emphasize daily activities. If first question of day is 'How are your headaches/Do you have a headache today?', then patient will constantly think about headaches, thus making them worse. Goal is to re-direct attention away from headaches, toward daily activities and other distractions.   10. Helpful Websites: www.AmericanHeadacheSociety.org PatentHood.ch www.headaches.org TightMarket.nl www.achenet.org
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
## 2024-04-06 ENCOUNTER — Ambulatory Visit (INDEPENDENT_AMBULATORY_CARE_PROVIDER_SITE_OTHER): Admitting: Family Medicine

## 2024-04-06 ENCOUNTER — Ambulatory Visit: Admitting: Nurse Practitioner

## 2024-04-06 ENCOUNTER — Encounter: Payer: Self-pay | Admitting: Family Medicine

## 2024-04-06 VITALS — BP 140/91 | HR 66 | Ht 66.0 in | Wt 197.5 lb

## 2024-04-06 DIAGNOSIS — G43711 Chronic migraine without aura, intractable, with status migrainosus: Secondary | ICD-10-CM

## 2024-04-06 MED ORDER — NURTEC 75 MG PO TBDP: 1.0000 | ORAL_TABLET | ORAL | 11 refills | Status: AC | PRN

## 2024-04-06 MED ORDER — EMGALITY 120 MG/ML ~~LOC~~ SOAJ: 120.0000 mg | SUBCUTANEOUS | 0 refills | Status: DC

## 2024-04-06 NOTE — Telephone Encounter (Signed)
 Patient no showed her appointment today with Darice. Patient needs to r/s appointment. Labs cannot be done without the office visit.

## 2024-04-06 NOTE — Progress Notes (Deleted)
 There were no vitals taken for this visit.   Subjective:    Patient ID: Janet Coleman, female    DOB: 12-May-1986, 38 y.o.   MRN: [id number]  HPI: Janet Coleman is a 38 y.o. female  No chief complaint on file.  HYPERTENSION {Blank single:19197::without,with} Chronic Kidney Disease Hypertension status: {Blank single:19197::controlled,uncontrolled,better,worse,exacerbated,stable}  Satisfied with current treatment? {Blank single:19197::yes,no} Duration of hypertension: {Blank single:19197::chronic,months,years} BP monitoring frequency:  {Blank single:19197::not checking,rarely,daily,weekly,monthly,a few times a day,a few times a week,a few times a month} BP range:  BP medication side effects:  {Blank single:19197::yes,no} Medication compliance: {Blank single:19197::excellent compliance,good compliance,fair compliance,poor compliance} Previous BP meds:{Blank multiple:19196::none,amlodipine,amlodipine/benazepril,atenolol,benazepril,benazepril/HCTZ,bisoprolol (bystolic),carvedilol,chlorthalidone,clonidine,diltiazem,exforge HCT,HCTZ,irbesartan (avapro),labetalol,lisinopril,lisinopril-HCTZ,losartan (cozaar),methyldopa,nifedipine,olmesartan (benicar),olmesartan-HCTZ,quinapril,ramipril,spironalactone,tekturna,valsartan,valsartan-HCTZ,verapamil} Aspirin : {Blank single:19197::yes,no} Recurrent headaches: {Blank single:19197::yes,no} Visual changes: {Blank single:19197::yes,no} Palpitations: {Blank single:19197::yes,no} Dyspnea: {Blank single:19197::yes,no} Chest pain: {Blank single:19197::yes,no} Lower extremity edema: {Blank single:19197::yes,no} Dizzy/lightheaded: {Blank single:19197::yes,no}  HYPOTHYROIDISM Thyroid  control status:{Blank single:19197::controlled,uncontrolled,better,worse,exacerbated,stable} Satisfied with current  treatment? {Blank single:19197::yes,no} Medication side effects: {Blank single:19197::yes,no} Medication compliance: {Blank single:19197::excellent compliance,good compliance,fair compliance,poor compliance} Etiology of hypothyroidism:  Recent dose adjustment:{Blank single:19197::yes,no} Fatigue: {Blank single:19197::yes,no} Cold intolerance: {Blank single:19197::yes,no} Heat intolerance: {Blank single:19197::yes,no} Weight gain: {Blank single:19197::yes,no} Weight loss: {Blank single:19197::yes,no} Constipation: {Blank single:19197::yes,no} Diarrhea/loose stools: {Blank single:19197::yes,no} Palpitations: {Blank single:19197::yes,no} Lower extremity edema: {Blank single:19197::yes,no} Anxiety/depressed mood: {Blank single:19197::yes,no}  MOOD Relevant past medical, surgical, family and social history reviewed and updated as indicated. Interim medical history since our last visit reviewed. Allergies and medications reviewed and updated.  Review of Systems  Per HPI unless specifically indicated above     Objective:    There were no vitals taken for this visit.  Wt Readings from Last 3 Encounters:  04/06/24 197 lb 8 oz (89.6 kg)  01/05/24 200 lb (90.7 kg)  09/21/23 199 lb (90.3 kg)    Physical Exam  Results for orders placed or performed during the hospital encounter of 01/05/24  hCG, serum, qualitative   Collection Time: 01/05/24  2:37 PM  Result Value Ref Range   Preg, Serum NEGATIVE NEGATIVE  MRSA Next Gen by PCR, Nasal   Collection Time: 01/05/24 10:02 PM   Specimen: Nasal Mucosa; Nasal Swab  Result Value Ref Range   MRSA by PCR Next Gen NOT DETECTED NOT DETECTED  Basic metabolic panel with GFR   Collection Time: 01/06/24  6:25 AM  Result Value Ref Range   Sodium 138 135 - 145 mmol/L   Potassium 3.9 3.5 - 5.1 mmol/L   Chloride 105 98 - 111 mmol/L   CO2 25 22 - 32 mmol/L   Glucose, Bld 96 70 - 99 mg/dL    BUN 10 6 - 20 mg/dL   Creatinine, Ser 9.23 0.44 - 1.00 mg/dL   Calcium 8.7 (L) 8.9 - 10.3 mg/dL   GFR, Estimated >39 >39 mL/min   Anion gap 8 5 - 15  CBC   Collection Time: 01/06/24  6:25 AM  Result Value Ref Range   WBC 8.0 4.0 - 10.5 K/uL   RBC 4.21 3.87 - 5.11 MIL/uL   Hemoglobin 13.3 12.0 - 15.0 g/dL   HCT 61.3 63.9 - 53.9 %   MCV 91.7 80.0 - 100.0 fL   MCH 31.6 26.0 - 34.0 pg   MCHC 34.5 30.0 - 36.0 g/dL   RDW 86.7 88.4 - 84.4 %   Platelets 203 150 - 400 K/uL   nRBC 0.0 0.0 - 0.2 %  Glucose, capillary   Collection Time: 01/06/24 10:48 AM  Result Value Ref Range   Glucose-Capillary 99 70 - 99 mg/dL  Comment 1 Notify RN    Comment 2 Document in Chart   Basic metabolic panel   Collection Time: 01/07/24  5:59 AM  Result Value Ref Range   Sodium 139 135 - 145 mmol/L   Potassium 3.7 3.5 - 5.1 mmol/L   Chloride 103 98 - 111 mmol/L   CO2 26 22 - 32 mmol/L   Glucose, Bld 107 (H) 70 - 99 mg/dL   BUN 11 6 - 20 mg/dL   Creatinine, Ser 9.20 0.44 - 1.00 mg/dL   Calcium 8.4 (L) 8.9 - 10.3 mg/dL   GFR, Estimated >39 >39 mL/min   Anion gap 10 5 - 15  CBC   Collection Time: 01/07/24  5:59 AM  Result Value Ref Range   WBC 10.4 4.0 - 10.5 K/uL   RBC 3.73 (L) 3.87 - 5.11 MIL/uL   Hemoglobin 12.0 12.0 - 15.0 g/dL   HCT 64.8 (L) 63.9 - 53.9 %   MCV 94.1 80.0 - 100.0 fL   MCH 32.2 26.0 - 34.0 pg   MCHC 34.2 30.0 - 36.0 g/dL   RDW 86.9 88.4 - 84.4 %   Platelets 193 150 - 400 K/uL   nRBC 0.0 0.0 - 0.2 %      Assessment & Plan:   Problem List Items Addressed This Visit       Cardiovascular and Mediastinum   Hypertension     Endocrine   Hypothyroidism     Other   Anxiety   Depression, recurrent (HCC) - Primary     Follow up plan: No follow-ups on file.

## 2024-04-07 ENCOUNTER — Other Ambulatory Visit

## 2024-04-07 NOTE — Telephone Encounter (Signed)
 R/S for 9/25

## 2024-04-10 ENCOUNTER — Other Ambulatory Visit (HOSPITAL_COMMUNITY): Payer: Self-pay

## 2024-04-10 ENCOUNTER — Telehealth: Payer: Self-pay

## 2024-04-10 NOTE — Telephone Encounter (Signed)
 Pharmacy Patient Advocate Encounter   Received notification from CoverMyMeds that prior authorization for Emgality  is required/requested.   Insurance verification completed.   The patient is insured through [facility] .   Per test claim: PA required; PA submitted to above mentioned insurance via Latent Key/confirmation #/EOC [id number] Status is pending

## 2024-04-11 ENCOUNTER — Other Ambulatory Visit (HOSPITAL_COMMUNITY): Payer: Self-pay

## 2024-04-11 NOTE — Telephone Encounter (Signed)
 Pharmacy Patient Advocate Encounter  Received notification from [facility] that Prior Authorization for Emgality  has been APPROVED from 04/11/2024 to 07/03/2024. Unable to obtain price due to refill too soon rejection, last fill date 04/11/2024 next available fill date10/01/2024   PA #/Case ID/Reference #: [id number]

## 2024-04-18 ENCOUNTER — Telehealth: Payer: Self-pay

## 2024-04-18 ENCOUNTER — Other Ambulatory Visit (HOSPITAL_COMMUNITY): Payer: Self-pay

## 2024-04-18 NOTE — Telephone Encounter (Signed)
  However per test claim a PA is needed-I will reach out to insurance plan to clarify. This is [employer] which is primary

## 2024-04-19 ENCOUNTER — Ambulatory Visit

## 2024-04-19 DIAGNOSIS — M25522 Pain in left elbow: Secondary | ICD-10-CM | POA: Diagnosis not present

## 2024-04-19 DIAGNOSIS — M25622 Stiffness of left elbow, not elsewhere classified: Secondary | ICD-10-CM
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
## 2024-04-20 ENCOUNTER — Ambulatory Visit: Admitting: Nurse Practitioner

## 2024-04-25 DIAGNOSIS — S42402D Unspecified fracture of lower end of left humerus, subsequent encounter for fracture with routine healing: Secondary | ICD-10-CM | POA: Diagnosis not present

## 2024-04-26 ENCOUNTER — Ambulatory Visit (INDEPENDENT_AMBULATORY_CARE_PROVIDER_SITE_OTHER): Admitting: Nurse Practitioner

## 2024-04-26 ENCOUNTER — Encounter: Payer: Self-pay | Admitting: Nurse Practitioner

## 2024-04-26 VITALS — BP 121/84 | HR 88 | Ht 66.0 in | Wt 196.4 lb

## 2024-04-26 DIAGNOSIS — F419 Anxiety disorder, unspecified: Secondary | ICD-10-CM | POA: Diagnosis not present

## 2024-04-26 DIAGNOSIS — E039 Hypothyroidism, unspecified: Secondary | ICD-10-CM

## 2024-04-26 DIAGNOSIS — E782 Mixed hyperlipidemia: Secondary | ICD-10-CM

## 2024-04-26 DIAGNOSIS — F339 Major depressive disorder, recurrent, unspecified: Secondary | ICD-10-CM

## 2024-04-26 DIAGNOSIS — E785 Hyperlipidemia, unspecified: Secondary | ICD-10-CM | POA: Insufficient documentation

## 2024-04-26 DIAGNOSIS — I10 Essential (primary) hypertension: Secondary | ICD-10-CM | POA: Diagnosis not present

## 2024-04-26 NOTE — Assessment & Plan Note (Signed)
 Chronic.  Controlled.  Continue with current medication regimen.  Labs ordered today.  Return to clinic in 6 months for reevaluation.  Call sooner if concerns arise.  ? ?
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
## 2024-04-26 NOTE — Assessment & Plan Note (Signed)
 Chronic.  Controlled without medication..  Labs ordered today.  Return to clinic in 6 months for reevaluation.  Call sooner if concerns arise.

## 2024-04-27 ENCOUNTER — Ambulatory Visit: Payer: Self-pay | Admitting: Nurse Practitioner

## 2024-04-27 LAB — COMPREHENSIVE METABOLIC PANEL WITH GFR
ALT: 10 IU/L (ref 0–32)
AST: 14 IU/L (ref 0–40)
Albumin: 4.5 g/dL (ref 3.9–4.9)
Alkaline Phosphatase: 120 IU/L — ABNORMAL HIGH (ref 41–116)
BUN/Creatinine Ratio: 10 (ref 9–23)
BUN: 8 mg/dL (ref 6–20)
Bilirubin Total: 0.3 mg/dL (ref 0.0–1.2)
CO2: 22 mmol/L (ref 20–29)
Calcium: 9.6 mg/dL (ref 8.7–10.2)
Chloride: 101 mmol/L (ref 96–106)
Creatinine, Ser: 0.78 mg/dL (ref 0.57–1.00)
Globulin, Total: 2.5 g/dL (ref 1.5–4.5)
Glucose: 52 mg/dL — ABNORMAL LOW (ref 70–99)
Potassium: 4.1 mmol/L (ref 3.5–5.2)
Sodium: 139 mmol/L (ref 134–144)
Total Protein: 7 g/dL (ref 6.0–8.5)
eGFR: 100 mL/min/1.73 (ref >59)

## 2024-04-27 LAB — TSH: TSH: 4.22 u[IU]/mL (ref 0.450–4.500)

## 2024-04-27 LAB — LIPID PANEL
Chol/HDL Ratio: 3.5 ratio (ref 0.0–4.4)
Cholesterol, Total: 193 mg/dL (ref 100–199)
HDL: 55 mg/dL (ref >39)
LDL Chol Calc (NIH): 107 mg/dL — ABNORMAL HIGH (ref 0–99)
Triglycerides: 182 mg/dL — ABNORMAL HIGH (ref 0–149)
VLDL Cholesterol Cal: 31 mg/dL (ref 5–40)

## 2024-04-27 LAB — T4: T4, Total: 7 ug/dL (ref 4.5–12.0)

## 2024-05-03 ENCOUNTER — Other Ambulatory Visit (HOSPITAL_COMMUNITY): Payer: Self-pay

## 2024-05-03 NOTE — Telephone Encounter (Signed)
 PA form faxed along with clinicals, might get denied since PT has not tried [place].

## 2024-05-03 NOTE — Telephone Encounter (Signed)
 Pt called stating that Pharmacy informed PT to call  [place] to follow up on PT Medication PA.  Rimegepant Sulfate (NURTEC) 75 MG TBDP   pharmacy   [id number] - [place], [place] - [address] (Ph: [phone])

## 2024-05-03 NOTE — Telephone Encounter (Signed)
 I called [employer] and they do require a PA-they would not do a verbal PA states they will fax to me. I will submit as urgent once form has been received.

## 2024-05-05 NOTE — Telephone Encounter (Signed)
 Pharmacy Patient Advocate Encounter  Received notification from [facility] that Prior Authorization for Nurtec has been DENIED.  Full denial letter will be uploaded to the media tab. See denial reason below.   PA #/Case ID/Reference #: [id number]

## 2024-05-08 ENCOUNTER — Telehealth: Payer: Self-pay | Admitting: Pharmacist

## 2024-05-08 MED ORDER — UBRELVY 100 MG PO TABS: 100.0000 mg | ORAL_TABLET | Freq: Every day | ORAL | 11 refills | Status: AC | PRN

## 2024-05-08 NOTE — Addendum Note (Signed)
 Addended by: CARY NO L on: 05/08/2024 08:27 AM   Modules accepted: Orders

## 2024-05-08 NOTE — Telephone Encounter (Signed)
 Pharmacy Patient Advocate Encounter   Received notification from Patient Pharmacy that prior authorization for Ubrelvy 100MG  tablets is required/requested.   Insurance verification completed.   The patient is insured through [facility].   Per test claim: PA required; PA submitted to above mentioned insurance via Latent Key/confirmation #/EOC [id number] Status is pending

## 2024-05-09 NOTE — Telephone Encounter (Signed)
 Pharmacy Patient Advocate Encounter  Received notification from [facility] that Prior Authorization for UBRELVY 100 MG PO TABS has been APPROVED from 05/09/2024 to 07/31/2024   PA #/Case ID/Reference #: [id number]

## 2024-05-19 ENCOUNTER — Encounter

## 2024-06-05 DIAGNOSIS — M25522 Pain in left elbow: Secondary | ICD-10-CM | POA: Diagnosis not present

## 2024-06-14 ENCOUNTER — Telehealth: Payer: Self-pay | Admitting: Family Medicine

## 2024-06-14 NOTE — Telephone Encounter (Signed)
 Pharmacy Patient Advocate Encounter  Received notification from [employer] that Prior Authorization for Nurtec has been APPROVED from 06/14/2024 to 09/12/2024. Ran test claim, Copay is $518.54. This test claim was processed through [facility]- copay amounts may vary at other pharmacies due to pharmacy/plan contracts, or as the patient moves through the different stages of their insurance plan.   PA #/Case ID/Reference #: [id number]  Its approved however very high copay for a 30DS which is 16 tablets.

## 2024-06-14 NOTE — Telephone Encounter (Signed)
 Pt has been told a PA is needed for Rimegepant Sulfate (NURTEC) 75 MG TBDP as a result of a change in insurance.

## 2024-06-19 DIAGNOSIS — M25522 Pain in left elbow: Secondary | ICD-10-CM | POA: Diagnosis not present

## 2024-06-23 DIAGNOSIS — M25522 Pain in left elbow: Secondary | ICD-10-CM | POA: Diagnosis not present

## 2024-06-27 DIAGNOSIS — M25522 Pain in left elbow: Secondary | ICD-10-CM | POA: Diagnosis not present

## 2024-06-29 ENCOUNTER — Telehealth: Payer: Self-pay | Admitting: Nurse Practitioner

## 2024-06-29 DIAGNOSIS — Z111 Encounter for screening for respiratory tuberculosis: Secondary | ICD-10-CM

## 2024-06-29 DIAGNOSIS — M25522 Pain in left elbow: Secondary | ICD-10-CM | POA: Diagnosis not present

## 2024-06-29 NOTE — Telephone Encounter (Signed)
Lab ordered for patient.

## 2024-06-29 NOTE — Telephone Encounter (Signed)
Can this be ordered for the patient?

## 2024-06-29 NOTE — Telephone Encounter (Signed)
 Appt scheduled

## 2024-06-29 NOTE — Telephone Encounter (Signed)
 Please call and schedule lab visit for patient.

## 2024-06-29 NOTE — Telephone Encounter (Signed)
 Patient is requesting Lab Order for TB Test. Informed patient a message will be sent to her provider and to allow 48-72 hours for the provider to respond. Please advise [phone]

## 2024-06-30 ENCOUNTER — Encounter: Payer: Self-pay | Admitting: Pharmacist

## 2024-06-30 ENCOUNTER — Other Ambulatory Visit (HOSPITAL_COMMUNITY): Payer: Self-pay

## 2024-06-30 ENCOUNTER — Other Ambulatory Visit: Payer: Self-pay | Admitting: Obstetrics and Gynecology

## 2024-06-30 ENCOUNTER — Telehealth: Payer: Self-pay | Admitting: Pharmacist

## 2024-06-30 DIAGNOSIS — N898 Other specified noninflammatory disorders of vagina: Secondary | ICD-10-CM

## 2024-06-30 NOTE — Telephone Encounter (Signed)
 Pt call to follow up about PA for medication Rimegepant Sulfate (NURTEC) 75 MG TBDP .Informed that  as of today its still  pending . Pt just wanted me to note that she called to check

## 2024-06-30 NOTE — Telephone Encounter (Signed)
 Pharmacy Patient Advocate Encounter   Received notification from Patient Pharmacy that prior authorization for Nurtec 75MG  dispersible tablets is required/requested.   Insurance verification completed.   The patient is insured through [facility].   Per test claim: PA required; PA submitted to above mentioned insurance via CoverMyMeds Key/confirmation #/[id number] Status is pending

## 2024-06-30 NOTE — Telephone Encounter (Signed)
 Pharmacy Patient Advocate Encounter  Received notification from [facility] that Prior Authorization for Nurtec 75MG  dispersible tablets has been APPROVED from 06/30/2024 to 06/30/2025   PA #/Case ID/Reference #: [id number]     My chart message sent to patient regarding approval.

## 2024-07-03 ENCOUNTER — Other Ambulatory Visit

## 2024-07-03 DIAGNOSIS — Z111 Encounter for screening for respiratory tuberculosis: Secondary | ICD-10-CM | POA: Diagnosis not present

## 2024-07-03 DIAGNOSIS — M25522 Pain in left elbow: Secondary | ICD-10-CM | POA: Diagnosis not present

## 2024-07-05 ENCOUNTER — Other Ambulatory Visit: Payer: Self-pay | Admitting: *Deleted

## 2024-07-05 ENCOUNTER — Telehealth: Payer: Self-pay | Admitting: Pharmacist

## 2024-07-05 MED ORDER — EMGALITY 120 MG/ML ~~LOC~~ SOAJ: 120.0000 mg | SUBCUTANEOUS | 4 refills | Status: AC

## 2024-07-05 NOTE — Telephone Encounter (Signed)
 Pharmacy Patient Advocate Encounter   Received notification from Patient Pharmacy that prior authorization for Emgality  120MG /ML auto-injectors (migraine) is required/requested.   Insurance verification completed.   The patient is insured through [facility].   Per test claim: PA required; PA submitted to above mentioned insurance via Latent Key/confirmation #/EOC [id number] Status is pending

## 2024-07-05 NOTE — Telephone Encounter (Signed)
 Pharmacy Patient Advocate Encounter  Received notification from [employer] that Prior Authorization for EMGALITY  120 MG/ML [place] [facility] has been APPROVED from 07/05/2024 to 07/05/2025   PA #/Case ID/Reference #: [id number]

## 2024-07-05 NOTE — Telephone Encounter (Signed)
 Last seen on 04/06/24 Follow up scheduled on 04/16/25

## 2024-07-06 ENCOUNTER — Ambulatory Visit: Payer: Self-pay | Admitting: Family Medicine

## 2024-07-06 DIAGNOSIS — M25522 Pain in left elbow: Secondary | ICD-10-CM | POA: Diagnosis not present

## 2024-07-06 LAB — QUANTIFERON-TB GOLD PLUS
QuantiFERON Mitogen Value: 8.85 [IU]/mL
QuantiFERON Nil Value: 0.03 [IU]/mL
QuantiFERON TB1 Ag Value: 0.02 [IU]/mL
QuantiFERON TB2 Ag Value: 0.02 [IU]/mL
QuantiFERON-TB Gold Plus: NEGATIVE

## 2024-07-10 DIAGNOSIS — M25522 Pain in left elbow: Secondary | ICD-10-CM | POA: Diagnosis not present

## 2024-07-12 DIAGNOSIS — M25522 Pain in left elbow: Secondary | ICD-10-CM | POA: Diagnosis not present

## 2024-08-21 ENCOUNTER — Ambulatory Visit: Admitting: Neurology

## 2024-10-25 ENCOUNTER — Ambulatory Visit: Admitting: Nurse Practitioner

## 2025-04-16 ENCOUNTER — Ambulatory Visit: Admitting: Family Medicine
